# Patient Record
Sex: Male | Born: 1935 | Race: White | Hispanic: No | Marital: Married | Smoking: Former smoker
Health system: Southern US, Community
[De-identification: ages and names within clinical notes are randomized; demographics above are authoritative.]

## PROBLEM LIST (undated history)

## (undated) DIAGNOSIS — R0600 Dyspnea, unspecified: Secondary | ICD-10-CM

## (undated) DIAGNOSIS — M1990 Unspecified osteoarthritis, unspecified site: Secondary | ICD-10-CM

## (undated) DIAGNOSIS — M47812 Spondylosis without myelopathy or radiculopathy, cervical region: Secondary | ICD-10-CM

## (undated) DIAGNOSIS — J449 Chronic obstructive pulmonary disease, unspecified: Secondary | ICD-10-CM

## (undated) DIAGNOSIS — F419 Anxiety disorder, unspecified: Secondary | ICD-10-CM

## (undated) DIAGNOSIS — C801 Malignant (primary) neoplasm, unspecified: Secondary | ICD-10-CM

## (undated) DIAGNOSIS — Z87438 Personal history of other diseases of male genital organs: Secondary | ICD-10-CM

## (undated) DIAGNOSIS — T7840XA Allergy, unspecified, initial encounter: Secondary | ICD-10-CM

## (undated) DIAGNOSIS — I10 Essential (primary) hypertension: Secondary | ICD-10-CM

## (undated) DIAGNOSIS — R3911 Hesitancy of micturition: Secondary | ICD-10-CM

## (undated) DIAGNOSIS — T8859XA Other complications of anesthesia, initial encounter: Secondary | ICD-10-CM

## (undated) DIAGNOSIS — T4145XA Adverse effect of unspecified anesthetic, initial encounter: Secondary | ICD-10-CM

## (undated) DIAGNOSIS — Z9889 Other specified postprocedural states: Secondary | ICD-10-CM

## (undated) DIAGNOSIS — G8929 Other chronic pain: Secondary | ICD-10-CM

## (undated) DIAGNOSIS — R300 Dysuria: Secondary | ICD-10-CM

## (undated) DIAGNOSIS — K449 Diaphragmatic hernia without obstruction or gangrene: Secondary | ICD-10-CM

## (undated) DIAGNOSIS — K219 Gastro-esophageal reflux disease without esophagitis: Secondary | ICD-10-CM

## (undated) DIAGNOSIS — N320 Bladder-neck obstruction: Secondary | ICD-10-CM

## (undated) DIAGNOSIS — F40240 Claustrophobia: Secondary | ICD-10-CM

## (undated) DIAGNOSIS — R7301 Impaired fasting glucose: Secondary | ICD-10-CM

## (undated) DIAGNOSIS — Z8719 Personal history of other diseases of the digestive system: Secondary | ICD-10-CM

## (undated) DIAGNOSIS — Z8551 Personal history of malignant neoplasm of bladder: Secondary | ICD-10-CM

## (undated) HISTORY — PX: CARDIOVASCULAR STRESS TEST: SHX262

## (undated) HISTORY — DX: Allergy, unspecified, initial encounter: T78.40XA

## (undated) HISTORY — DX: Personal history of malignant neoplasm of bladder: Z85.51

## (undated) HISTORY — DX: Personal history of other diseases of the digestive system: Z87.19

## (undated) HISTORY — DX: Spondylosis without myelopathy or radiculopathy, cervical region: M47.812

## (undated) HISTORY — DX: Unspecified osteoarthritis, unspecified site: M19.90

## (undated) HISTORY — DX: Chronic obstructive pulmonary disease, unspecified: J44.9

## (undated) HISTORY — DX: Impaired fasting glucose: R73.01

## (undated) HISTORY — DX: Gastro-esophageal reflux disease without esophagitis: K21.9

## (undated) HISTORY — PX: TRANSURETHRAL RESECTION OF PROSTATE: SHX73

## (undated) HISTORY — DX: Malignant (primary) neoplasm, unspecified: C80.1

## (undated) HISTORY — DX: Other chronic pain: G89.29

## (undated) HISTORY — PX: CATARACT EXTRACTION W/ INTRAOCULAR LENS  IMPLANT, BILATERAL: SHX1307

## (undated) HISTORY — DX: Personal history of other diseases of male genital organs: Z87.438

## (undated) HISTORY — DX: Other specified postprocedural states: Z98.890

## (undated) HISTORY — DX: Diaphragmatic hernia without obstruction or gangrene: K44.9

---

## 1963-07-02 HISTORY — PX: OTHER SURGICAL HISTORY: SHX169

## 1982-07-01 HISTORY — PX: CHOLECYSTECTOMY: SHX55

## 1986-07-01 HISTORY — PX: RETINAL DETACHMENT SURGERY: SHX105

## 2005-03-21 ENCOUNTER — Encounter (INDEPENDENT_AMBULATORY_CARE_PROVIDER_SITE_OTHER): Payer: Self-pay | Admitting: Internal Medicine

## 2005-03-21 ENCOUNTER — Ambulatory Visit: Payer: Self-pay | Admitting: Internal Medicine

## 2005-03-21 ENCOUNTER — Ambulatory Visit (HOSPITAL_COMMUNITY): Admission: RE | Admit: 2005-03-21 | Discharge: 2005-03-21 | Payer: Self-pay | Admitting: Internal Medicine

## 2006-11-12 ENCOUNTER — Ambulatory Visit (HOSPITAL_COMMUNITY): Admission: RE | Admit: 2006-11-12 | Discharge: 2006-11-12 | Payer: Self-pay | Admitting: Family Medicine

## 2008-03-31 ENCOUNTER — Ambulatory Visit (HOSPITAL_COMMUNITY): Admission: RE | Admit: 2008-03-31 | Discharge: 2008-03-31 | Payer: Self-pay | Admitting: Family Medicine

## 2008-05-04 ENCOUNTER — Ambulatory Visit: Payer: Self-pay | Admitting: Cardiology

## 2008-05-04 ENCOUNTER — Ambulatory Visit (HOSPITAL_COMMUNITY): Admission: RE | Admit: 2008-05-04 | Discharge: 2008-05-04 | Payer: Self-pay | Admitting: Family Medicine

## 2008-05-09 ENCOUNTER — Encounter (HOSPITAL_COMMUNITY): Admission: RE | Admit: 2008-05-09 | Discharge: 2008-06-08 | Payer: Self-pay | Admitting: Cardiology

## 2008-05-09 ENCOUNTER — Ambulatory Visit: Payer: Self-pay | Admitting: Cardiology

## 2008-05-17 ENCOUNTER — Ambulatory Visit: Payer: Self-pay | Admitting: Cardiology

## 2008-09-06 ENCOUNTER — Ambulatory Visit: Payer: Self-pay | Admitting: Cardiology

## 2009-03-07 DIAGNOSIS — K219 Gastro-esophageal reflux disease without esophagitis: Secondary | ICD-10-CM

## 2009-03-07 DIAGNOSIS — R0789 Other chest pain: Secondary | ICD-10-CM

## 2009-03-07 DIAGNOSIS — N400 Enlarged prostate without lower urinary tract symptoms: Secondary | ICD-10-CM

## 2009-03-07 DIAGNOSIS — M1990 Unspecified osteoarthritis, unspecified site: Secondary | ICD-10-CM | POA: Insufficient documentation

## 2009-09-04 ENCOUNTER — Ambulatory Visit (HOSPITAL_COMMUNITY): Admission: RE | Admit: 2009-09-04 | Discharge: 2009-09-04 | Payer: Self-pay | Admitting: Family Medicine

## 2009-09-26 ENCOUNTER — Ambulatory Visit (HOSPITAL_COMMUNITY): Admission: RE | Admit: 2009-09-26 | Discharge: 2009-09-26 | Payer: Self-pay | Admitting: Family Medicine

## 2009-11-02 ENCOUNTER — Ambulatory Visit: Payer: Self-pay | Admitting: Cardiology

## 2009-11-02 ENCOUNTER — Encounter: Payer: Self-pay | Admitting: Adult Health

## 2009-11-02 DIAGNOSIS — R0609 Other forms of dyspnea: Secondary | ICD-10-CM | POA: Insufficient documentation

## 2009-11-02 DIAGNOSIS — R079 Chest pain, unspecified: Secondary | ICD-10-CM

## 2009-11-02 DIAGNOSIS — R030 Elevated blood-pressure reading, without diagnosis of hypertension: Secondary | ICD-10-CM

## 2009-11-02 DIAGNOSIS — R0989 Other specified symptoms and signs involving the circulatory and respiratory systems: Secondary | ICD-10-CM

## 2009-11-08 ENCOUNTER — Ambulatory Visit: Payer: Self-pay | Admitting: Cardiology

## 2009-11-08 ENCOUNTER — Encounter (HOSPITAL_COMMUNITY): Admission: RE | Admit: 2009-11-08 | Discharge: 2009-12-08 | Payer: Self-pay | Admitting: Cardiology

## 2009-11-14 ENCOUNTER — Encounter: Payer: Self-pay | Admitting: Cardiology

## 2009-11-17 ENCOUNTER — Ambulatory Visit: Payer: Self-pay | Admitting: Cardiology

## 2010-01-26 ENCOUNTER — Emergency Department (HOSPITAL_COMMUNITY): Admission: EM | Admit: 2010-01-26 | Discharge: 2010-01-26 | Payer: Self-pay | Admitting: Emergency Medicine

## 2010-03-26 ENCOUNTER — Inpatient Hospital Stay (HOSPITAL_COMMUNITY): Admission: RE | Admit: 2010-03-26 | Discharge: 2010-03-28 | Payer: Self-pay | Admitting: General Surgery

## 2010-03-27 HISTORY — PX: OTHER SURGICAL HISTORY: SHX169

## 2010-07-31 NOTE — Letter (Signed)
Summary: [place] [facility] (Nuc Med Stress)  [facility]  [address] [place] [address]   Phone: [phone]  Fax: [phone]    Nuclear Medicine 1-Day Stress Test Information Sheet  Re:     Randy [name]   DOB:     August 05, 1928 MRN:     [id number] Weight:  Appointment Date: Register at: Appointment Time: Referring MD:  ___Exercise Stress  __Adenosine   __Dobutamine  _X_Lexiscan  __Persantine   __Thallium  Urgency: ____1 (next day)   ____2 (one week)    ____3 (PRN)  Patient will receive Follow Up call with results: Patient needs follow-up appointment:  Instructions regarding medication:  How to prepare for your stress test: 1. DO NOT eat or dring 6 hours prior to your arrival time. This includes no caffeine (coffee, tea, sodas, chocolate) if you were instructed to take your medications, drink water with it. 2. DO NOT use any tobacco products for at leaset 8 hours prior to arrival. 3. DO NOT wear dresses or any clothing that may have metal clasps or buttons. 4. Wear short sleeve shirts, loose clothing, and comfortalbe walking shoes. 5. DO NOT use lotions, oils or powder on your chest before the test. 6. The test will take approximately 3-4 hours from the time you arrive until completion. 7. To register the day of the test, go to the Short Stay entrance at [facility]. 8. If you must cancel your test, call [phone] as soon as you are aware.  After you arrive for test:   When you arrive at [facility], you will go to Short Stay to be registered. They will then send you to Radiology to check in. The Nuclear Medicine Tech will get you and start an IV in your arm or hand. A small amount of a radioactive tracer will then be injected into your IV. This tracer will then have to circulate for 30-45 minutes. During this time you will wait in the waiting room and you will be able to drink something without caffeine. A series of pictures will be taken  of your heart follwoing this waiting period. After the 1st set of pictures you will go to the stress lab to get ready for your stress test. During the stress test, another small amount of a radioactive tracer will be injected through your IV. When the stress test is complete, there is a short rest period while your heart rate and blood pressure will be monitored. When this monitoring period is complete you will have another set of pictrues taken. (The same as the 1st set of pictures). These pictures are taken between 15 minutes and 1 hour after the stress test. The time depends on the type of stress test you had. Your doctor will inform you of your test results within 7 days after test.    The possibilities of certain changes are possible during the test. They include abnormal blood pressure and disorders of the heart. Side effects of persantine or adenosine can include flushing, chest pain, shortness of breath, stomach tightness, headache and light-headedness. These side effects usually do not last long and are self-resolving. Every effort will be made to keep you comfortable and to minimize complications by obtaining a medical history and by close observation during the test. Emergency equipment, medications, and trained personnel are available to deal with any unusual situation which may arise.  Please notify office at least 48 hours in advance if you are unable to keep  this appt.

## 2010-07-31 NOTE — Assessment & Plan Note (Signed)
Summary: 1 YR F/U PER CKOUT 09/06/08-DSF   Visit Type:  Follow-up Primary Provider:  luking,scott  CC:  shortness of breath.  History of Present Illness: [name] is a very pleasant 77 y/o CM who we are seeing on annual follow-up. He has a hx of GERD, DOE.  He was last seen in March of 2010 for evaluation of DOE.  He had a stress myoview in Nov of 2009, with overall risk low, no clearly diagnostic ST-segment changes noted during exercise.  Perfusion data demonstrated a mild to moderate inferior wall persistant defect.  No clear evidence of ischemia.  His EF at the time showed 69% with possible mild inferior hypokinesis.  Since being seen last, Dr. Gerda Diss had him seen by Dr. Juanetta Gosling for pulmonary evaluation.  He was placed on Spiriva.  He began to have itching of the face, areas above the eyes and across the chest. He stopped this medication this week and has appt with Dr. Juanetta Gosling in 7 days.  He states that the Spiriva did not change his symptoms.  He states that he has dyspnea walking 21ft or more.  However, he can mow his lawn with a self-propelled mower and has no difficulties. Resting helps.  He denies chest pain, but continues to have some hiatal hernia discomfort that is not new for him.  He remains on Priolosec and states this has helped.  Preventive Screening-Counseling & Management  Alcohol-Tobacco     Alcohol drinks/day: 0     Smoking Status: quit  Problems Prior to Update: 1)  Benign Prostatic Hypertrophy, Hx of  (ICD-V13.8) 2)  Degenerative Joint Disease  (ICD-715.90) 3)  Gerd  (ICD-530.81) 4)  Chest Discomfort  (ICD-786.59)  Current Medications (verified): 1)  Tamsulosin Hcl 0.4 Mg Caps (Tamsulosin Hcl) .... 2 Caps Every Morning 2)  Finasteride 5 Mg Tabs (Finasteride) .... Take 1 Tab Daily 3)  Prilosec 20 Mg Cpdr (Omeprazole) .... Take 1 Tab Daily 4)  Ibuprofen 200 Mg Caps (Ibuprofen) .... Take Prn 5)  Vitamin B-12 1000 Mcg Tabs (Cyanocobalamin) .... Take 1 Tab  Daily  Allergies (verified): No Known Drug Allergies  Past History:  Past medical, surgical, family and social histories (including risk factors) reviewed, and no changes noted (except as noted below).  Past Medical History: Reviewed history from 03/07/2009 and no changes required. Current Problems:  BENIGN PROSTATIC HYPERTROPHY, HX OF (ICD-V13.8) DEGENERATIVE JOINT DISEASE (ICD-715.90) GERD (ICD-530.81) CHEST DISCOMFORT (ZOX-096.04)  Past Surgical History: Reviewed history from 03/07/2009 and no changes required. gallbladder bilaterial cataracts removed colonoscopy  Family History: Reviewed history from 03/07/2009 and no changes required. Father:deceased age 63 renal failure Mother:deceased age 60 stroke Siblings:4 brothers 3 of whom are deceased cause unknown 4 sisters alive and well  Social History: Reviewed history from 03/07/2009 and no changes required. Retired  Married  Tobacco Use - Former.  Alcohol Use - no Regular Exercise - yes Drug Use - no Alcohol drinks/day:  0  Review of Systems       Puritis of the face, eyes, and chest wall.  Vital Signs:  Patient profile:   77 year old male Height:      70 inches Weight:      167 pounds BMI:     24.05 Pulse rate:   80 / minute BP sitting:   164 / 92  (right arm)  Vitals Entered By: Dreama Saa, CNA (Nov 02, 2009 10:55 AM)  Physical Exam  General:  Well developed, well nourished, in no acute  distress. Head:  normocephalic and atraumatic Eyes:  PERRLA/EOM intact; conjunctiva and lids normal. Ears:  TM's intact and clear with normal canals and hearing Nose:  no deformity, discharge, inflammation, or lesions Mouth:  Teeth, gums and palate normal. Oral mucosa normal. Neck:  Neck supple, no JVD. No masses, thyromegaly or abnormal cervical nodes. Lungs:  Crackles in the RLL no wheezes noted. Heart:  RRR with 1/6 Systolic murmur RSB.   Abdomen:  Bowel sounds positive; abdomen soft and non-tender without  masses, organomegaly, or hernias noted. No hepatosplenomegaly. Msk:  Back normal, normal gait. Muscle strength and tone normal. Pulses:  pulses normal in all 4 extremities Extremities:  No clubbing or cyanosis. Neurologic:  Alert and oriented x 3. Psych:  Normal affect.   Impression & Recommendations:  Problem # 1:  CHEST DISCOMFORT (ICD-786.59) The patients symptoms are persistant, and DOE seems to have worsened per pt, despite pulmonary evlauation and tx.  EKG shows some anterior nonspecific T-wave abnormalities.  Will plan to have lexascan myoview to ascertain for ischemia.  He is a little clostrophobic, so I have prescribed valium 5 mg prior to the test to aid in decreasing his anxiety.    Problem # 2:  ELEVATED BLOOD PRESSURE WITHOUT DIAGNOSIS OF HYPERTENSION (ICD-796.2) I believe this is related to white coat and possibly wait time to be seen.  We check again on stress test.  If remains elevated, will start antihypertensive.  Problem # 3:  DYSPNEA ON EXERTION (ICD-786.09) Will assess cardiac etiology and have recommended he follow-up with Dr. Juanetta Gosling for more tx.  I have advised him to take OTC benedryl for itching and report these symptoms to Dr. Juanetta Gosling.  Other Orders: Nuclear Stress Test (Nuc Stress Test)  Patient Instructions: 1)  Your physician recommends that you schedule a follow-up appointment in: after Stress test 2)  Your physician has requested that you have an Lexiscan myoview.  For further information please visit [email or web address]  Please follow instruction sheet, as given. 3)  ***Prescription for 1 tablet of Valium 5mg  has been called into [facility]. Please take Valium tablet 1/2 hour to 1 hour before Stress test.***

## 2010-07-31 NOTE — Assessment & Plan Note (Signed)
Summary: ROV POST STRESS TEST   Visit Type:  Follow-up Primary Provider:  luking,scott  CC:  NO CARDIOLOGY COMPLAINTS.  History of Present Illness: Randy [name] is a very friendly CM with history of GERD and DOE.  He is here for follow-up on test results from exercise myoview to evaluate for ischemia causing symptoms.  He is without complaint other than mild dyspnea, mild GERD symptoms at times.  Current Medications (verified): 1)  Tamsulosin Hcl 0.4 Mg Caps (Tamsulosin Hcl) .... 2 Caps Every Morning 2)  Finasteride 5 Mg Tabs (Finasteride) .... Take 1 Tab Daily 3)  Prilosec 20 Mg Cpdr (Omeprazole) .... Take 1 Tab Daily 4)  Ibuprofen 200 Mg Caps (Ibuprofen) .... Take Prn 5)  Vitamin B-12 1000 Mcg Tabs (Cyanocobalamin) .... Take 1 Tab Daily  Allergies (verified): No Known Drug Allergies  Review of Systems       All other systems have been reviewed and are negative unless stated above.   Vital Signs:  Patient profile:   77 year old male Weight:      165 pounds Pulse rate:   80 / minute BP sitting:   135 / 73  (right arm)  Vitals Entered By: Dreama Saa, CNA (Nov 17, 2009 2:10 PM)  Physical Exam  General:  Well developed, well nourished, in no acute distress. Lungs:  Clear bilaterally to auscultation and percussion. Heart:  Non-displaced PMI, chest non-tender; regular rate and rhythm, S1, S2 without murmurs, rubs or gallops. Carotid upstroke normal, no bruit. Normal abdominal aortic size, no bruits. Femorals normal pulses, no bruits. Pedals normal pulses. No edema, no varicosities.   Impression & Recommendations:  Problem # 1:  DYSPNEA ON EXERTION (ICD-786.09) Review of nuclear study reveals no large reversiible defects to indicate ischemia,  LVEF 64%.  Hypertensive responsie to excervise of 172/82.  We will make no changes to his medication regiimine as he is not doing a lot of exertional activity.  Should his BP become an issue when he is not exertional will reconsider  adding ACE.  He will see [place] in 1 yr unlless he needs to see [place] sooner.  Patient Instructions: 1)  Your physician recommends that you schedule a follow-up appointment in: 1 year 2)  Your physician recommends that you continue on your current medications as directed. Please refer to the Current Medication list given to you today.

## 2010-07-31 NOTE — Letter (Signed)
Summary: [place] [facility] at [facility]  [address] [place] [address]   Phone: [phone]  Fax: [phone]      Nov 14, 2009 MRN: [id number]   Randy [name] [address] [place], [place]  [address]   Dear Mr. LEONHARD,  Your test ordered by Selena Batten has been reviewed by your physician (or physician assistant) and was found to be normal or stable. Your physician (or physician assistant) felt no changes were needed at this time.  ____ Echocardiogram  __X__ Cardiac Stress Test  ____ Lab Work  ____ Peripheral vascular study of arms, legs or neck  ____ CT scan or X-ray  ____ Lung or Breathing test  ____ Other: Please continue current medical treatment.  Thank you.    Bing, MD, F.A.C.C

## 2010-09-13 LAB — COMPREHENSIVE METABOLIC PANEL
ALT: 16 U/L (ref 0–53)
AST: 16 U/L (ref 0–37)
Alkaline Phosphatase: 50 U/L (ref 39–117)
BUN: 16 mg/dL (ref 6–23)
Chloride: 104 mEq/L (ref 96–112)
Creatinine, Ser: 1.36 mg/dL (ref 0.4–1.5)
GFR calc non Af Amer: 51 mL/min — ABNORMAL LOW (ref >60)
Potassium: 4.9 mEq/L (ref 3.5–5.1)

## 2010-09-13 LAB — URINALYSIS, ROUTINE W REFLEX MICROSCOPIC
Glucose, UA: NEGATIVE mg/dL
Ketones, ur: NEGATIVE mg/dL
Leukocytes, UA: NEGATIVE
Protein, ur: NEGATIVE mg/dL
Specific Gravity, Urine: 1.009 (ref 1.005–1.030)
pH: 6 (ref 5.0–8.0)

## 2010-09-13 LAB — DIFFERENTIAL
Basophils Relative: 0 % (ref 0–1)
Eosinophils Absolute: 0.2 10*3/uL (ref 0.0–0.7)
Eosinophils Relative: 3 % (ref 0–5)
Monocytes Absolute: 0.6 10*3/uL (ref 0.1–1.0)
Monocytes Relative: 11 % (ref 3–12)
Neutro Abs: 3.3 10*3/uL (ref 1.7–7.7)
Neutrophils Relative %: 58 % (ref 43–77)

## 2010-09-13 LAB — CBC
HCT: 44.7 % (ref 39.0–52.0)
Hemoglobin: 13.4 g/dL (ref 13.0–17.0)
MCHC: 33.9 g/dL (ref 30.0–36.0)
MCHC: 34 g/dL (ref 30.0–36.0)
MCV: 83.4 fL (ref 78.0–100.0)
MCV: 83.4 fL (ref 78.0–100.0)
Platelets: 167 10*3/uL (ref 150–400)
RBC: 4.74 MIL/uL (ref 4.22–5.81)
RBC: 5.36 MIL/uL (ref 4.22–5.81)
WBC: 5.8 10*3/uL (ref 4.0–10.5)
WBC: 8.2 10*3/uL (ref 4.0–10.5)

## 2010-09-13 LAB — SURGICAL PCR SCREEN
MRSA, PCR: NEGATIVE
Staphylococcus aureus: NEGATIVE

## 2010-09-13 LAB — URINE MICROSCOPIC-ADD ON

## 2010-09-15 LAB — URINE CULTURE

## 2010-09-15 LAB — COMPREHENSIVE METABOLIC PANEL
AST: 13 U/L (ref 0–37)
Albumin: 3.9 g/dL (ref 3.5–5.2)
Alkaline Phosphatase: 50 U/L (ref 39–117)
Calcium: 8.8 mg/dL (ref 8.4–10.5)
GFR calc non Af Amer: 48 mL/min — ABNORMAL LOW (ref >60)
Glucose, Bld: 121 mg/dL — ABNORMAL HIGH (ref 70–99)
Potassium: 4.3 mEq/L (ref 3.5–5.1)
Sodium: 134 mEq/L — ABNORMAL LOW (ref 135–145)
Total Protein: 7 g/dL (ref 6.0–8.3)

## 2010-09-15 LAB — CBC
HCT: 40.8 % (ref 39.0–52.0)
Hemoglobin: 13.9 g/dL (ref 13.0–17.0)
MCHC: 34 g/dL (ref 30.0–36.0)
MCV: 81.4 fL (ref 78.0–100.0)
RDW: 15 % (ref 11.5–15.5)

## 2010-09-15 LAB — POCT CARDIAC MARKERS

## 2010-09-15 LAB — URINALYSIS, ROUTINE W REFLEX MICROSCOPIC
Bilirubin Urine: NEGATIVE
Glucose, UA: NEGATIVE mg/dL
Ketones, ur: NEGATIVE mg/dL
Nitrite: NEGATIVE
Protein, ur: NEGATIVE mg/dL
Specific Gravity, Urine: 1.02 (ref 1.005–1.030)
Urobilinogen, UA: 0.2 mg/dL (ref 0.0–1.0)
pH: 5.5 (ref 5.0–8.0)

## 2010-09-15 LAB — DIFFERENTIAL
Basophils Absolute: 0 10*3/uL (ref 0.0–0.1)
Basophils Relative: 0 % (ref 0–1)
Eosinophils Absolute: 0.1 10*3/uL (ref 0.0–0.7)
Eosinophils Relative: 1 % (ref 0–5)
Lymphs Abs: 0.8 10*3/uL (ref 0.7–4.0)
Monocytes Relative: 8 % (ref 3–12)

## 2010-11-13 NOTE — Letter (Signed)
May 04, 2008    Scott A. Gerda Diss, MD  [address], [address]  [place], [place] [address]   RE:  ICKER, SWIGERT  MRN:  [id number]  /  DOB:  September 24, 1928   Dear Lorin Picket:   It is my pleasure evaluating [name] in consultation in the office  today at your request for chest discomfort.  As you know, this nice  gentleman has enjoyed generally excellent health.  He has no history of  hypertension, diabetes or hyperlipidemia.  He does have a 50-pack-year  history of cigarette smoking, but this was discontinued approximately 10  years ago.  He has noted over the past few weeks exertional chest  pressure.  This is described as fairly mild.  There is minimal  associated dyspnea and no nausea nor diaphoresis, although he has noted  increased diaphoresis at other times.  Symptoms generally resolve  spontaneously over a variable period of time.  He has had prolonged  symptoms on occasion lasting a day or two.  He describes separate  symptoms, that have been present for a longer period of time.  This is a  vague discomfort along both posterior axillary lines associated with  increased use of the upper extremities.  He is active and generally can  exercise without difficulty.   He has no history of cardiac disease.  He has never previously seen a  cardiologist.  He has not undergone any significant cardiac testing.   PAST MEDICAL HISTORY:  Notable for GERD, DJD and BPH.   ALLERGIES:  He has no known drug allergies.   CURRENT MEDICATIONS:  1. Prilosec 20 mg daily.  2. Finasteride 5 mg daily.  3. Flomax 0.8 mg daily.   SOCIAL HISTORY:  Retired from work with the post office; married with 4  adult children.   FAMILY HISTORY:  Positive for fatal renal diseases in his father at  advanced age and a CVA in his mother at age 75.  He has had 8 siblings;  3 brothers are deceased.  There is no prominent coronary artery disease.   REVIEW OF SYSTEMS:  Notable for occasional dizziness, bilateral  cataract  surgery, some hearing impairment, upper and lower dentures, urinary  frequency and diffuse arthritic discomfort.  All other systems reviewed  and are negative.   PHYSICAL EXAMINATION:  GENERAL:  On exam, pleasant, well-appearing trim  gentleman in no acute distress.  VITAL SIGNS:  The weight is 166.  Blood pressure 135/75, heart rate 95  and regular, respirations 14.  NECK:  No jugular venous distention; normal carotid upstrokes without  bruits.  HEENT:  Anicteric sclerae; normal lids and conjunctivae; normal oral  mucosa.  ENDOCRINE:  No thyromegaly.  HEMATOPOIETIC:  No adenopathy.  SKIN:  No significant lesions.  LUNGS:  Clear.  CARDIAC:  Normal first and second heart sounds; normal PMI; minimal  systolic ejection murmur.  ABDOMEN:  Soft and nontender; no organomegaly; no masses; normal bowel  sounds without bruits.  EXTREMITIES:  Normal distal pulses; no edema.  NEUROLOGIC:  Symmetric strength and tone; normal cranial nerves.   Recent laboratory shows a normal CBC and a good lipid profile.  Creatinine values have of been borderline, most recently 1.42.  LFTs  were normal.   EKG from your office shows sinus rhythm with PACs.  There is slightly  delayed R-wave progression with nonspecific ST-T wave abnormality.   IMPRESSION:  Mr. Colver has somewhat worrisome, but not classic symptoms  for myocardial ischemia with fairly modest  risk factors.  He certainly  requires additional testing.  We will proceed with a stress nuclear  study.  A chest x-ray will also be obtained.  I will reassess this nice  gentleman after those studies have been completed.  Thank you so much  for sending him to me for evaluation.    Sincerely,      Gerrit Friends. Dietrich Pates, MD, [facility]  Electronically Signed    RMR/MedQ  DD: 05/04/2008  DT: 05/05/2008  Job #: [id number]

## 2010-11-13 NOTE — Letter (Signed)
May 17, 2008    Randy A. Gerda Diss, MD  [address], [address]  [place], [place] [address]   RE:  Randy, [name]  MRN:  [name]  /  DOB:  [name]   Dear Randy [name],   Randy [name] returns to the office for continued assessment and treatment  of chest discomfort.  Over the past few days, he has been free of  symptoms, but prior to that, he continued to experience some vague  exertional and nonexertional chest pressure that resolved spontaneously.  Medications are unchanged from his last visit.   PHYSICAL EXAMINATION:  GENERAL:  A pleasant gentleman in no acute  distress.  VITAL SIGNS:  The weight is 162, 4 pounds less than at his last visit.  Blood pressure 120/80, heart rate 65 and regular, and respirations 14.  NECK:  No jugular vein distention.  LUNGS:  Clear.  CARDIAC:  Normal first and second heart sounds; modest systolic ejection  murmur.  ABDOMEN:  Soft and nontender; no masses.  No organomegaly.  EXTREMITIES:  No edema.   A stress nuclear study revealed impaired exercise capacity with the  patient achieving a workload of only 5 METS.  The patient claims that he  could have achieved a higher level of exertion if permitted to continue.  There was no electrocardiographic evidence for ischemia.  His  scintigraphic images were read as showing a basilar inferior defect  compatible with scarring or soft tissue attenuation.  I reviewed the  images and find them to be normal.   IMPRESSION:  Randy [name] has had resolution of his symptoms over the past  few days, but I suspect they will recur.  He has a low risk stress  nuclear study.  For now, I think it is safe to continue to observe him.  I suspect that he will ultimately require coronary angiography for  definitive diagnosis.  I will plan to reassess his symptoms in 3 months  or sooner at your discretion.   Thank you so much for sending this nice gentleman to see me.    Sincerely,      Gerrit Friends. Dietrich Pates, MD, [facility]  Electronically Signed    RMR/MedQ  DD: 05/17/2008  DT: 05/18/2008  Job #: [id number]

## 2010-11-13 NOTE — Letter (Signed)
September 06, 2008    Randy A. Gerda Diss, MD  [address], [address]  [place], [place] [address]   RE:  Randy, Randy [name]  MRN:  [id number]  /  DOB:  1928-08-08   Dear Randy Randy [name],   Randy Randy [name] returns to the office for continued assessment and treatment  of chest discomfort.  He has had minimal symptoms, which she attributes  to his GERD.  He has had no major reflux nor water brash.  He has rare  and intermittent hoarseness that he also attributes to GERD.  BPH is  under good control.  He has fairly good exercise tolerance, having  recently rototilled his garden.  He gets some fatigue, but no real  dyspnea and no exercise related chest discomfort.   CURRENT MEDICATIONS:  Unchanged and include Prilosec 20 mg daily,  finasteride 5 mg daily, Flomax 0.8 mg daily.   PHYSICAL EXAMINATION:  GENERAL:  Pleasant proportionate gentleman in no  acute distress.  VITAL SIGNS:  The weight is 164, two pounds more than in November.  Blood pressure 120/80, heart rate 70 and regular, respirations 12 and  unlabored.  NECK:  No jugular venous distention; no carotid bruits.  LUNGS:  Clear.  CARDIAC:  Normal first and second heart sounds.  ABDOMEN:  Soft and nontender; no organomegaly.  EXTREMITIES:  No edema.   Recent lab includes a normal chemistry profile except for glucose of  129.  Creatinine is borderline at 1.4 as his potassium at 5.3.  His  lipid profile was good with a total cholesterol of 180, HDL of 48, and  LDL of 107.   IMPRESSION:  Randy Randy [name] is doing well overall.  I suggested that he  could increase his dose of Prilosec 220 mg b.i.d. if he has more  symptoms of hoarseness or chest discomfort.  I will plan to reassess  this nice gentleman in 1 year.    Sincerely,      Gerrit Friends. Dietrich Pates, MD, [facility]  Electronically Signed    RMR/MedQ  DD: 09/06/2008  DT: 09/07/2008  Job #: [id number]

## 2010-11-16 NOTE — Op Note (Signed)
Randy [name], Randy [name]                ACCOUNT NO.:  [id number]   MEDICAL RECORD NO.:  [id number]          PATIENT TYPE:  AMB   LOCATION:  DAY                           FACILITY:  [facility]   PHYSICIAN:  Lionel December, M.D.    DATE OF BIRTH:  06/21/1929   DATE OF PROCEDURE:  03/21/2005  DATE OF DISCHARGE:                                 OPERATIVE REPORT   PROCEDURE:  Colonoscopy with polypectomy.   INDICATIONS:  Keeghan is 77 year old Caucasian male with two polyps removed in  May2000, one of which was tubular adenoma. Family history is positive for  colon carcinoma in one of his older brothers who had the disease in his 65s  and died of unrelated causes. He presently is free of any GI symptoms.  Procedure risks were reviewed with the patient, and informed consent was  obtained.   PREMEDICATION:  Demerol 50 mg IV, Versed 3 mg IV in divided dose.   FINDINGS:  Procedure performed in endoscopy suite. The patient's vital signs  and O2 saturation were monitored during the procedure and remained stable.  The patient was placed in left lateral position and rectal examination  performed. No abnormality noted on external or digital exam. Olympus  videoscope was placed in the rectum and advanced under vision into sigmoid  colon. He still has some thick liquid stool in sigmoid colon which had to be  washed and eventually suctioned out. He had a few scattered diverticula at  sigmoid colon and few more at the ascending colon. Scope was passed to cecum  which was identified by ileocecal valve and appendiceal orifice. Pictures  taken for the record. There was a 6 to 7 mm sessile polyp across from the  ileocecal valve which was snared and retrieved histologic examination. There  was a diverticulum to the right but away from this polyp. Polypectomy site  was clean. As the scope was withdrawn, the rest of the colonic mucosa was  carefully examined, and no other abnormalities were noted. Rectal mucosa was  normal. Scope was retroflexed to examine anorectal junction, and small  hemorrhoids were noted below the dentate line. Endoscope was straightened  and withdrawn. The patient tolerated the procedure well.   FINAL DIAGNOSIS:  1.  Pan colonic diverticulosis. Most of the diverticula are at sigmoid colon      and ascending colon but this are only few in number.  2.  A 6 to 7 mm polyp snared from cecum.  3.  Small external hemorrhoids.   RECOMMENDATIONS:  1.  Standard instructions given.  2.  High-fiber diet.  3.  I will be contacting the patient with biopsy results.  4.  Given his personal and family history, he may consider next exam in five      years as long as he is in good health.      Lionel December, M.D.  Electronically Signed     NR/MEDQ  D:  03/21/2005  T:  03/21/2005  Job:  [id number]

## 2011-03-20 ENCOUNTER — Other Ambulatory Visit: Payer: Self-pay | Admitting: Family Medicine

## 2011-03-20 DIAGNOSIS — R270 Ataxia, unspecified: Secondary | ICD-10-CM

## 2011-03-22 ENCOUNTER — Ambulatory Visit (HOSPITAL_COMMUNITY)
Admission: RE | Admit: 2011-03-22 | Discharge: 2011-03-22 | Disposition: A | Discharge status: Home / Self Care | Payer: Medicare Other | Source: Ambulatory Visit | Attending: Family Medicine | Admitting: Family Medicine

## 2011-03-22 DIAGNOSIS — R209 Unspecified disturbances of skin sensation: Secondary | ICD-10-CM | POA: Insufficient documentation

## 2011-03-22 DIAGNOSIS — R55 Syncope and collapse: Secondary | ICD-10-CM | POA: Insufficient documentation

## 2011-03-22 DIAGNOSIS — R270 Ataxia, unspecified: Secondary | ICD-10-CM

## 2011-03-22 DIAGNOSIS — R279 Unspecified lack of coordination: Secondary | ICD-10-CM | POA: Insufficient documentation

## 2011-03-22 DIAGNOSIS — I6529 Occlusion and stenosis of unspecified carotid artery: Secondary | ICD-10-CM | POA: Insufficient documentation

## 2011-06-27 ENCOUNTER — Encounter: Payer: Self-pay | Admitting: Cardiology

## 2011-08-14 DIAGNOSIS — Z79899 Other long term (current) drug therapy: Secondary | ICD-10-CM | POA: Diagnosis not present

## 2011-08-14 DIAGNOSIS — Z0000 Encounter for general adult medical examination without abnormal findings: Secondary | ICD-10-CM | POA: Diagnosis not present

## 2011-08-26 DIAGNOSIS — J40 Bronchitis, not specified as acute or chronic: Secondary | ICD-10-CM | POA: Diagnosis not present

## 2011-08-26 DIAGNOSIS — J988 Other specified respiratory disorders: Secondary | ICD-10-CM | POA: Diagnosis not present

## 2011-10-14 DIAGNOSIS — N400 Enlarged prostate without lower urinary tract symptoms: Secondary | ICD-10-CM | POA: Diagnosis not present

## 2011-10-14 DIAGNOSIS — I951 Orthostatic hypotension: Secondary | ICD-10-CM | POA: Diagnosis not present

## 2011-12-12 DIAGNOSIS — Z961 Presence of intraocular lens: Secondary | ICD-10-CM | POA: Diagnosis not present

## 2011-12-17 ENCOUNTER — Other Ambulatory Visit: Payer: Self-pay | Admitting: Urology

## 2011-12-17 ENCOUNTER — Ambulatory Visit (INDEPENDENT_AMBULATORY_CARE_PROVIDER_SITE_OTHER): Payer: Medicare Other | Admitting: Urology

## 2011-12-17 DIAGNOSIS — N400 Enlarged prostate without lower urinary tract symptoms: Secondary | ICD-10-CM | POA: Diagnosis not present

## 2011-12-17 DIAGNOSIS — C679 Malignant neoplasm of bladder, unspecified: Secondary | ICD-10-CM

## 2011-12-17 DIAGNOSIS — N401 Enlarged prostate with lower urinary tract symptoms: Secondary | ICD-10-CM

## 2011-12-18 ENCOUNTER — Other Ambulatory Visit: Payer: Self-pay | Admitting: Urology

## 2011-12-19 ENCOUNTER — Ambulatory Visit (HOSPITAL_COMMUNITY)
Admission: RE | Admit: 2011-12-19 | Discharge: 2011-12-19 | Disposition: A | Discharge status: Home / Self Care | Payer: Medicare Other | Source: Ambulatory Visit | Attending: Urology | Admitting: Urology

## 2011-12-19 ENCOUNTER — Other Ambulatory Visit: Payer: Self-pay | Admitting: Urology

## 2011-12-19 DIAGNOSIS — K5730 Diverticulosis of large intestine without perforation or abscess without bleeding: Secondary | ICD-10-CM | POA: Insufficient documentation

## 2011-12-19 DIAGNOSIS — C679 Malignant neoplasm of bladder, unspecified: Secondary | ICD-10-CM | POA: Insufficient documentation

## 2011-12-19 LAB — POCT I-STAT, CHEM 8
BUN: 26 mg/dL — ABNORMAL HIGH (ref 6–23)
Creatinine, Ser: 1.2 mg/dL (ref 0.50–1.35)
Glucose, Bld: 115 mg/dL — ABNORMAL HIGH (ref 70–99)
Hemoglobin: 16.7 g/dL (ref 13.0–17.0)
Sodium: 141 mEq/L (ref 135–145)
TCO2: 27 mmol/L (ref 0–100)

## 2011-12-19 MED ORDER — IOHEXOL 300 MG/ML  SOLN
125.0000 mL | Freq: Once | INTRAMUSCULAR | Status: AC | PRN
  Administered 2011-12-19: 125 mL via INTRAVENOUS

## 2011-12-19 NOTE — OR Nursing (Signed)
Istat obtained from venipunture to left [facility]. (EC 8) Patient tolerated well. One stick. Pressure applied after removing needle. Patient stable.

## 2011-12-23 ENCOUNTER — Ambulatory Visit (HOSPITAL_COMMUNITY)
Admission: RE | Admit: 2011-12-23 | Discharge: 2011-12-23 | Disposition: A | Discharge status: Home / Self Care | Payer: Medicare Other | Source: Ambulatory Visit | Attending: Urology | Admitting: Urology

## 2011-12-23 DIAGNOSIS — C679 Malignant neoplasm of bladder, unspecified: Secondary | ICD-10-CM | POA: Insufficient documentation

## 2011-12-23 LAB — POCT I-STAT, CHEM 8
Calcium, Ion: 1.2 mmol/L (ref 1.12–1.32)
Creatinine, Ser: 1.2 mg/dL (ref 0.50–1.35)
Glucose, Bld: 117 mg/dL — ABNORMAL HIGH (ref 70–99)
Hemoglobin: 17.7 g/dL — ABNORMAL HIGH (ref 13.0–17.0)
Potassium: 5 mEq/L (ref 3.5–5.1)

## 2011-12-23 MED ORDER — IOHEXOL 300 MG/ML  SOLN
125.0000 mL | Freq: Once | INTRAMUSCULAR | Status: AC | PRN
  Administered 2011-12-23: 125 mL via INTRAVENOUS

## 2011-12-23 NOTE — OR Nursing (Signed)
12/23/2011  ISTAT completed for CT. (ES-8) Drawn in left [facility]. Patient tolerated well.

## 2011-12-26 ENCOUNTER — Encounter (HOSPITAL_BASED_OUTPATIENT_CLINIC_OR_DEPARTMENT_OTHER): Payer: Self-pay | Admitting: *Deleted

## 2011-12-26 NOTE — Progress Notes (Signed)
To [facility] @ 0745-Istat,Cxr,Ekg on arrival-Npo after Mn-will take prilosec,xanax with sip water that am-guidelines for RCC reviewed.

## 2012-01-05 NOTE — Anesthesia Preprocedure Evaluation (Addendum)
Anesthesia Evaluation  Patient identified by MRN, date of birth, ID band Patient awake    Reviewed: Allergy & Precautions, H&P , NPO status , Patient's Chart, lab work & pertinent test results  Airway Mallampati: II TM Distance: >3 FB Neck ROM: full    Dental  (+) Edentulous Upper and Edentulous Lower   Pulmonary neg pulmonary ROS, shortness of breath and with exertion,  breath sounds clear to auscultation  Pulmonary exam normal       Cardiovascular Exercise Tolerance: Good negative cardio ROS  Rhythm:regular Rate:Normal     Neuro/Psych negative neurological ROS  negative psych ROS   GI/Hepatic negative GI ROS, Neg liver ROS, GERD-  Medicated and Controlled,  Endo/Other  negative endocrine ROS  Renal/GU negative Renal ROS  negative genitourinary   Musculoskeletal   Abdominal   Peds  Hematology negative hematology ROS (+)   Anesthesia Other Findings   Reproductive/Obstetrics negative OB ROS                          Anesthesia Physical Anesthesia Plan  ASA: II  Anesthesia Plan: General   Post-op Pain Management:    Induction: Intravenous  Airway Management Planned: LMA  Additional Equipment:   Intra-op Plan:   Post-operative Plan:   Informed Consent: I have reviewed the patients History and Physical, chart, labs and discussed the procedure including the risks, benefits and alternatives for the proposed anesthesia with the patient or authorized representative who has indicated his/her understanding and acceptance.   Dental Advisory Given  Plan Discussed with: CRNA and Surgeon  Anesthesia Plan Comments:         Anesthesia Quick Evaluation

## 2012-01-06 ENCOUNTER — Encounter (HOSPITAL_BASED_OUTPATIENT_CLINIC_OR_DEPARTMENT_OTHER): Payer: Self-pay | Admitting: Anesthesiology

## 2012-01-06 ENCOUNTER — Encounter (HOSPITAL_BASED_OUTPATIENT_CLINIC_OR_DEPARTMENT_OTHER)
Admission: RE | Disposition: A | Discharge status: Home / Self Care | Payer: Self-pay | Source: Ambulatory Visit | Attending: Urology

## 2012-01-06 ENCOUNTER — Ambulatory Visit (HOSPITAL_BASED_OUTPATIENT_CLINIC_OR_DEPARTMENT_OTHER)
Admission: RE | Admit: 2012-01-06 | Discharge: 2012-01-07 | Disposition: A | Discharge status: Home / Self Care | Payer: Medicare Other | Source: Ambulatory Visit | Attending: Urology | Admitting: Urology

## 2012-01-06 ENCOUNTER — Encounter (HOSPITAL_BASED_OUTPATIENT_CLINIC_OR_DEPARTMENT_OTHER): Payer: Self-pay | Admitting: *Deleted

## 2012-01-06 ENCOUNTER — Ambulatory Visit (HOSPITAL_COMMUNITY): Payer: Medicare Other

## 2012-01-06 ENCOUNTER — Ambulatory Visit (HOSPITAL_BASED_OUTPATIENT_CLINIC_OR_DEPARTMENT_OTHER): Payer: Medicare Other | Admitting: Anesthesiology

## 2012-01-06 DIAGNOSIS — C675 Malignant neoplasm of bladder neck: Secondary | ICD-10-CM | POA: Diagnosis not present

## 2012-01-06 DIAGNOSIS — N400 Enlarged prostate without lower urinary tract symptoms: Secondary | ICD-10-CM | POA: Diagnosis not present

## 2012-01-06 DIAGNOSIS — R05 Cough: Secondary | ICD-10-CM | POA: Diagnosis not present

## 2012-01-06 DIAGNOSIS — C679 Malignant neoplasm of bladder, unspecified: Secondary | ICD-10-CM | POA: Diagnosis not present

## 2012-01-06 DIAGNOSIS — D494 Neoplasm of unspecified behavior of bladder: Secondary | ICD-10-CM | POA: Diagnosis not present

## 2012-01-06 DIAGNOSIS — K219 Gastro-esophageal reflux disease without esophagitis: Secondary | ICD-10-CM | POA: Diagnosis not present

## 2012-01-06 DIAGNOSIS — N401 Enlarged prostate with lower urinary tract symptoms: Secondary | ICD-10-CM | POA: Insufficient documentation

## 2012-01-06 DIAGNOSIS — Z87891 Personal history of nicotine dependence: Secondary | ICD-10-CM | POA: Diagnosis not present

## 2012-01-06 DIAGNOSIS — Z01811 Encounter for preprocedural respiratory examination: Secondary | ICD-10-CM | POA: Diagnosis not present

## 2012-01-06 DIAGNOSIS — N3289 Other specified disorders of bladder: Secondary | ICD-10-CM | POA: Insufficient documentation

## 2012-01-06 DIAGNOSIS — C670 Malignant neoplasm of trigone of bladder: Secondary | ICD-10-CM | POA: Diagnosis not present

## 2012-01-06 DIAGNOSIS — N138 Other obstructive and reflux uropathy: Secondary | ICD-10-CM | POA: Insufficient documentation

## 2012-01-06 DIAGNOSIS — C61 Malignant neoplasm of prostate: Secondary | ICD-10-CM | POA: Diagnosis not present

## 2012-01-06 HISTORY — DX: Adverse effect of unspecified anesthetic, initial encounter: T41.45XA

## 2012-01-06 HISTORY — PX: TRANSURETHRAL RESECTION OF BLADDER TUMOR: SHX2575

## 2012-01-06 HISTORY — DX: Anxiety disorder, unspecified: F41.9

## 2012-01-06 HISTORY — DX: Other complications of anesthesia, initial encounter: T88.59XA

## 2012-01-06 LAB — POCT I-STAT 4, (NA,K, GLUC, HGB,HCT)
Glucose, Bld: 103 mg/dL — ABNORMAL HIGH (ref 70–99)
HCT: 48 % (ref 39.0–52.0)

## 2012-01-06 SURGERY — TURP (TRANSURETHRAL RESECTION OF PROSTATE)
Anesthesia: General | Site: Prostate | Wound class: Clean Contaminated

## 2012-01-06 MED ORDER — SODIUM CHLORIDE 0.45 % IV SOLN
INTRAVENOUS | Status: DC
  Administered 2012-01-06: 15:00:00 via INTRAVENOUS

## 2012-01-06 MED ORDER — ALPRAZOLAM 0.25 MG PO TABS: 0.2500 mg | ORAL_TABLET | Freq: Three times a day (TID) | ORAL | Status: DC | PRN

## 2012-01-06 MED ORDER — HYDROCODONE-ACETAMINOPHEN 5-325 MG PO TABS
1.0000 | ORAL_TABLET | ORAL | Status: DC | PRN
  Administered 2012-01-06 – 2012-01-07 (×3): 2 via ORAL

## 2012-01-06 MED ORDER — CIPROFLOXACIN HCL 250 MG PO TABS
250.0000 mg | ORAL_TABLET | Freq: Two times a day (BID) | ORAL | Status: DC
  Administered 2012-01-06 (×2): 250 mg via ORAL

## 2012-01-06 MED ORDER — SODIUM CHLORIDE 0.9 % IR SOLN
Status: DC | PRN
  Administered 2012-01-06: 9000 mL

## 2012-01-06 MED ORDER — BELLADONNA ALKALOIDS-OPIUM 16.2-60 MG RE SUPP: 1.0000 | Freq: Four times a day (QID) | RECTAL | Status: DC | PRN

## 2012-01-06 MED ORDER — LIDOCAINE HCL (CARDIAC) 20 MG/ML IV SOLN
INTRAVENOUS | Status: DC | PRN
  Administered 2012-01-06: 80 mg via INTRAVENOUS

## 2012-01-06 MED ORDER — CEFAZOLIN SODIUM 1-5 GM-% IV SOLN
1.0000 g | INTRAVENOUS | Status: AC
  Administered 2012-01-06: 1 g via INTRAVENOUS

## 2012-01-06 MED ORDER — ONDANSETRON HCL 4 MG/2ML IJ SOLN: 4.0000 mg | INTRAMUSCULAR | Status: DC | PRN

## 2012-01-06 MED ORDER — ONDANSETRON HCL 4 MG/2ML IJ SOLN
INTRAMUSCULAR | Status: DC | PRN
  Administered 2012-01-06: 4 mg via INTRAVENOUS

## 2012-01-06 MED ORDER — ZOLPIDEM TARTRATE 5 MG PO TABS
5.0000 mg | ORAL_TABLET | Freq: Every evening | ORAL | Status: DC | PRN
  Administered 2012-01-06: 5 mg via ORAL

## 2012-01-06 MED ORDER — ACETAMINOPHEN 325 MG PO TABS: 650.0000 mg | ORAL_TABLET | ORAL | Status: DC | PRN

## 2012-01-06 MED ORDER — LACTATED RINGERS IV SOLN: INTRAVENOUS | Status: DC

## 2012-01-06 MED ORDER — FENTANYL CITRATE 0.05 MG/ML IJ SOLN
INTRAMUSCULAR | Status: DC | PRN
  Administered 2012-01-06 (×2): 50 ug via INTRAVENOUS

## 2012-01-06 MED ORDER — LORATADINE 10 MG PO TABS: 10.0000 mg | ORAL_TABLET | Freq: Every day | ORAL | Status: DC

## 2012-01-06 MED ORDER — FENTANYL CITRATE 0.05 MG/ML IJ SOLN: 25.0000 ug | INTRAMUSCULAR | Status: DC | PRN

## 2012-01-06 MED ORDER — PANTOPRAZOLE SODIUM 40 MG PO TBEC: 40.0000 mg | DELAYED_RELEASE_TABLET | Freq: Every day | ORAL | Status: DC

## 2012-01-06 MED ORDER — BELLADONNA ALKALOIDS-OPIUM 16.2-60 MG RE SUPP
RECTAL | Status: DC | PRN
  Administered 2012-01-06: 1 via RECTAL

## 2012-01-06 MED ORDER — PROPOFOL 10 MG/ML IV EMUL
INTRAVENOUS | Status: DC | PRN
  Administered 2012-01-06: 200 mg via INTRAVENOUS

## 2012-01-06 MED ORDER — CIPROFLOXACIN HCL 250 MG PO TABS: 250.0000 mg | ORAL_TABLET | Freq: Two times a day (BID) | ORAL | Status: AC

## 2012-01-06 MED ORDER — LACTATED RINGERS IV SOLN
INTRAVENOUS | Status: DC
  Administered 2012-01-06: 08:00:00 via INTRAVENOUS

## 2012-01-06 SURGICAL SUPPLY — 41 items
BAG DRAIN URO-CYSTO SKYTR STRL (DRAIN) ×3 IMPLANT
BAG DRN ANRFLXCHMBR STRAP LEK (BAG)
BAG DRN UROCATH (DRAIN) ×2
BAG URINE DRAINAGE (UROLOGICAL SUPPLIES) ×1 IMPLANT
BAG URINE LEG 19OZ MD ST LTX (BAG) IMPLANT
CANISTER SUCT LVC 12 LTR MEDI- (MISCELLANEOUS) IMPLANT
CATH FOLEY 2WAY SLVR  5CC 20FR (CATHETERS)
CATH FOLEY 2WAY SLVR  5CC 22FR (CATHETERS)
CATH FOLEY 2WAY SLVR 30CC 22FR (CATHETERS) IMPLANT
CATH FOLEY 2WAY SLVR 5CC 20FR (CATHETERS) IMPLANT
CATH FOLEY 2WAY SLVR 5CC 22FR (CATHETERS) IMPLANT
CATH FOLEY 3WAY 30CC 22F (CATHETERS) ×1 IMPLANT
CATH HEMA 3WAY 30CC 24FR COUDE (CATHETERS) IMPLANT
CATH HEMA 3WAY 30CC 24FR RND (CATHETERS) IMPLANT
CLOTH BEACON ORANGE TIMEOUT ST (SAFETY) ×3 IMPLANT
DRAPE CAMERA CLOSED 9X96 (DRAPES) ×3 IMPLANT
ELECT BUTTON BIOP 24F 90D PLAS (MISCELLANEOUS) IMPLANT
ELECT BUTTON HF 24-28F 2 30DE (ELECTRODE) ×2 IMPLANT
ELECT LOOP HF 26F 30D .35MM (CUTTING LOOP) IMPLANT
ELECT LOOP MED HF 24F 12D CBL (CLIP) ×1 IMPLANT
ELECT NEEDLE 45D HF 24-28F 12D (CUTTING LOOP) IMPLANT
ELECT REM PT RETURN 9FT ADLT (ELECTROSURGICAL)
ELECTRODE REM PT RTRN 9FT ADLT (ELECTROSURGICAL) ×2 IMPLANT
EVACUATOR MICROVAS BLADDER (UROLOGICAL SUPPLIES) IMPLANT
GLOVE BIO SURGEON STRL SZ8 (GLOVE) ×3 IMPLANT
GLOVE BIOGEL PI IND STRL 6.5 (GLOVE) IMPLANT
GLOVE BIOGEL PI INDICATOR 6.5 (GLOVE) ×2
GOWN PREVENTION PLUS LG XLONG (DISPOSABLE) ×3 IMPLANT
GOWN STRL REIN XL XLG (GOWN DISPOSABLE) ×3 IMPLANT
GOWN XL W/COTTON TOWEL STD (GOWNS) ×3 IMPLANT
HOLDER FOLEY CATH W/STRAP (MISCELLANEOUS) IMPLANT
IV NS IRRIG 3000ML ARTHROMATIC (IV SOLUTION) ×5 IMPLANT
KIT ASPIRATION TUBING (SET/KITS/TRAYS/PACK) IMPLANT
LOOP CUTTING 24FR OLYMPUS (CUTTING LOOP) IMPLANT
NS IRRIG 500ML POUR BTL (IV SOLUTION) ×2 IMPLANT
PACK CYSTOSCOPY (CUSTOM PROCEDURE TRAY) ×3 IMPLANT
PLUG CATH AND CAP STER (CATHETERS) IMPLANT
SET ASPIRATION TUBING (TUBING) ×3 IMPLANT
SYR 30ML LL (SYRINGE) IMPLANT
SYRINGE IRR TOOMEY STRL 70CC (SYRINGE) IMPLANT
WATER STERILE IRR 500ML POUR (IV SOLUTION) ×1 IMPLANT

## 2012-01-06 NOTE — Anesthesia Postprocedure Evaluation (Signed)
  Anesthesia Post-op Note  Patient: Randy [name]  Procedure(s) Performed: Procedure(s) (LRB): TRANSURETHRAL RESECTION OF THE PROSTATE (TURP) (N/A) TRANSURETHRAL RESECTION OF BLADDER TUMOR (TURBT) (N/A)  Patient Location: PACU  Anesthesia Type: General  Level of Consciousness: awake and alert   Airway and Oxygen Therapy: Patient Spontanous Breathing  Post-op Pain: mild  Post-op Assessment: Post-op Vital signs reviewed, Patient's Cardiovascular Status Stable, Respiratory Function Stable, Patent Airway and No signs of Nausea or vomiting  Post-op Vital Signs: stable  Complications: No apparent anesthesia complications

## 2012-01-06 NOTE — Op Note (Signed)
Preoperative diagnosis: Bladder tumor, BPH Postoperative diagnosis: Same  Procedure: Anesthetic cystoscopy, TURBT of a 1.5 cm papillary bladder lesion, TURP   Surgeon: Bertram Millard. Aksh Swart, M.D.  Anesthesia: Gen.  Specimen: Bladder tumor, prostate chips  Drains: 22 French three-way Foley catheter Complications: None  Indications:78 year old male who recently presented to my office in [place], [place] with voiding symptomatology. Cystoscopy was performed, revealing a 1.5 cm right trigonal lesion, papillary in nature, as well as an obstructive prostate. He presents at this time for TURBT and TURP. Risks and complications of the procedure have been discussed with the patient. He understands these, and desires to proceed.     Technique and findings:Patient was properly identified in the holding area, and received preoperative IV antibiotics. He was then taken to the operating room where general anesthetic was administered using the LMA. He was placed in the dorsolithotomy position, genitalia and perineum were prepped and draped. Timeout was then performed.  The procedure then commenced. A 22 French panendoscope was passed transurethrally under direct vision. Urethra was normal. Prostate was minimally to moderately obstructive. The bladder was entered and inspected circumferentially. There were 1+ scattered trabeculations. There were no foreign bodies. Ureteral orifices were normal in configuration and location. The previously mentioned ladder tumor was located right at the right bladder neck, approximately 1.5 cm inferior to the right ureteral orifice. No other bladder lesions were seen. The cystoscope was removed.  A 28 French resectoscope sheath was then placed using the [place]lake obturator. The resectoscope element with the gyrus loop was then placed. The bladder tumor was resected down to the muscular layer with the gyrus loop. The papillary lesions were sent as "bladder tumor". The base  of the bladder tumor/resection site was then cauterized. This was hemostatic.  Following this, the TURP was performed. The loop was used to resect first the median lobe which was slightly protuberant. The median lobe was resected down to the surgical capsule, including incision in the bladder neck at the 6:00 position from the bladder neck down to the verumontanum. This widely opened the patient's bladder neck. I then resected the right and then left prostatic lobes, down to the surgical capsule/smooth muscle fibers. A small capsular perforation was made on the left side, which was easily cauterized. The anterior tissue was then resected. This afforded a wide-open prostatic fossa. The cutting loop was then used to cauterize small bleeders, following which hemostasis was excellent. The chips were evacuated from the bladder and sent as "prostate chips". Reinspection of the prostatic fossa revealed adequate hemostasis. The scope was removed, and a 22 [place] Foley catheter placed. This was a three-way Foley, with 30 cc of water placed in the balloon. This was hooked to CBI using normal saline.  The patient tolerated procedure well. He was awakened and taken to the PACU in stable condition.

## 2012-01-06 NOTE — H&P (Signed)
Urology History and Physical Exam  CC: Bladder tumor, difficulty voiding  HPI: 78 year old male presents for TUR-BT and TUR-P. He originally was seen on 12/17/2011 in our [place] office. His presenting history is as follows:  He was sent by Dr. Lilyan Punt for evaluation and management of lower urinary tract symptoms. He has been on tamsulosin and finasteride for quite some time. Despite this, he is having progressive urinary issues including hesitancy, intermittency, weak stream and feeling of incomplete emptying of his bladder. He has nocturia x2-3, which is not terribly bothersome. He denies any gross hematuria or dysuria. He has not been treated for any urinary tract infections recently.   Cystoscopy revealed an obstructive prostate as well as a 1.5 cm bladder tumor.   PMH: Past Medical History  Diagnosis Date  . History of BPH   . DJD (degenerative joint disease)     cervical  . GERD (gastroesophageal reflux disease)     takes prilosec intermittently  . Complication of anesthesia     claustraphobia  . Anxiety     claustraphobia    PSH: Past Surgical History  Procedure Date  . Cataract extraction, bilateral   . Colonoscopy   . Hiatal hernia repair 2011  . Retinal detachment surgery 1988  . Cholecystectomy 1984  . Cardiovascular stress test 2011    Allergies: No Known Allergies  Medications: No prescriptions prior to admission     Social History: History   Social History  . Marital Status: Married    Spouse Name: N/A    Number of Children: N/A  . Years of Education: N/A   Occupational History  . Retired    Social History Main Topics  . Smoking status: Former Smoker    Quit date: 12/26/1986  . Smokeless tobacco: Not on file  . Alcohol Use: No  . Drug Use: No  . Sexually Active: Not on file   Other Topics Concern  . Not on file   Social History Narrative   Exercises regularly    Family History: Family History  Problem Relation Age of  Onset  . Stroke Mother   . Kidney failure Father     Review of Systems: Constitutional: feeling tired (fatigue).  Integumentary: pruritus.  Eyes: blurred vision.  ENT: sinus problems.  Hematologic/Lymphatic: a tendency to easily bruise.  Respiratory: shortness of breath and cough.  Musculoskeletal: joint pain.  Neurological: dizziness.  Psychiatric: anxiety.     Physical Exam: @VITALS2 @ Constitutional: Well nourished and well developed . No acute distress.  ENT:. The ears and nose are normal in appearance.  Neck: The appearance of the neck is normal and no neck mass is present.  Pulmonary: No respiratory distress and normal respiratory rhythm and effort.  Cardiovascular: Heart rate and rhythm are normal . No peripheral edema.  Abdomen: right upper quadrant, subcostal incision site(s) well healed. The abdomen is flat. The abdomen is soft and nontender. No masses are palpated. No CVA tenderness.  A right inguinal hernia is present, which is reducible.  No inguinal hernia is present on the left. No hepatosplenomegaly noted.  Rectal: Rectal exam demonstrates normal sphincter tone, the anus is normal on inspection., no tenderness and no masses. Prostate size is estimated to be 40 g. Normal rectal tone, no rectal masses, prostate is smooth, symmetric and non-tender. The prostate has no nodularity and is not tender. The left seminal vesicle is nonpalpable. The right seminal vesicle is nonpalpable. The perineum is normal on inspection.  Genitourinary: Examination of  the penis demonstrates no discharge, no masses, no lesions and a normal meatus. The penis is uncircumcised. The scrotum is without lesions. The right epididymis is palpably normal and non-tender. The left epididymis is palpably normal and non-tender. The right testis is palpably normal, non-tender and without masses. The left testis is normal, non-tender and without masses.  Studies:  No results found for this basename:  HGB:2,WBC:2,PLT:2 in the last 72 hours  No results found for this basename: NA:2,K:2,CL:2,CO2:2,BUN:2,CREATININE:2,CALCIUM:2,MAGNESIUM:2,GFRNONAA:2,GFRAA:2 in the last 72 hours   No results found for this basename: PT:2,INR:2,APTT:2 in the last 72 hours   No components found with this basename: ABG:2    Assessment:     1. BPH. The patient is fairly symptomatic, despite him being on finasteride and tamsulosin. He is obstructed based on the cystoscopic appearance  2. Bladder tumor, right sidewall. This was unexpected, found incidentally upon cystoscopy for his voiding symptoms.    Plan:  1. TUR-BT  2. Concurrent TUR-P

## 2012-01-06 NOTE — Anesthesia Procedure Notes (Signed)
Procedure Name: LMA Insertion Date/Time: 01/06/2012 9:14 AM Performed by: Fran Lowes Pre-anesthesia Checklist: Patient identified, Emergency Drugs available, Suction available and Patient being monitored Patient Re-evaluated:Patient Re-evaluated prior to inductionOxygen Delivery Method: Circle System Utilized Preoxygenation: Pre-oxygenation with 100% oxygen Intubation Type: IV induction Ventilation: Mask ventilation without difficulty LMA: LMA inserted LMA Size: 4.0 Number of attempts: 1 Airway Equipment and Method: bite block Placement Confirmation: positive ETCO2 Tube secured with: Tape Dental Injury: Teeth and Oropharynx as per pre-operative assessment

## 2012-01-06 NOTE — Transfer of Care (Signed)
Immediate Anesthesia Transfer of Care Note  Patient: Randy [name]  Procedure(s) Performed: Procedure(s) (LRB): TRANSURETHRAL RESECTION OF THE PROSTATE (TURP) (N/A) TRANSURETHRAL RESECTION OF BLADDER TUMOR (TURBT) (N/A)  Patient Location: Patient transported to PACU with oxygen via face mask at 4 Liters / Min  Anesthesia Type: General  Level of Consciousness: awake and alert   Airway & Oxygen Therapy: Patient Spontanous Breathing and Patient connected to face mask oxygen  Post-op Assessment: Report given to PACU RN and Post -op Vital signs reviewed and stable  Post vital signs: Reviewed and stable  Dentition: Teeth and oropharynx remain in pre-op condition  Complications: No apparent anesthesia complications

## 2012-01-07 ENCOUNTER — Encounter (HOSPITAL_BASED_OUTPATIENT_CLINIC_OR_DEPARTMENT_OTHER): Payer: Self-pay | Admitting: Urology

## 2012-01-07 ENCOUNTER — Emergency Department (HOSPITAL_COMMUNITY)
Admission: EM | Admit: 2012-01-07 | Discharge: 2012-01-08 | Disposition: A | Discharge status: Home / Self Care | Payer: Medicare Other | Attending: Emergency Medicine | Admitting: Emergency Medicine

## 2012-01-07 DIAGNOSIS — Z87891 Personal history of nicotine dependence: Secondary | ICD-10-CM | POA: Insufficient documentation

## 2012-01-07 DIAGNOSIS — Z9889 Other specified postprocedural states: Secondary | ICD-10-CM | POA: Insufficient documentation

## 2012-01-07 DIAGNOSIS — Z823 Family history of stroke: Secondary | ICD-10-CM | POA: Insufficient documentation

## 2012-01-07 DIAGNOSIS — K219 Gastro-esophageal reflux disease without esophagitis: Secondary | ICD-10-CM | POA: Insufficient documentation

## 2012-01-07 DIAGNOSIS — R339 Retention of urine, unspecified: Secondary | ICD-10-CM

## 2012-01-07 DIAGNOSIS — Z841 Family history of disorders of kidney and ureter: Secondary | ICD-10-CM | POA: Insufficient documentation

## 2012-01-07 DIAGNOSIS — F411 Generalized anxiety disorder: Secondary | ICD-10-CM | POA: Insufficient documentation

## 2012-01-07 DIAGNOSIS — M5030 Other cervical disc degeneration, unspecified cervical region: Secondary | ICD-10-CM | POA: Insufficient documentation

## 2012-01-07 MED ORDER — CIPROFLOXACIN HCL 250 MG PO TABS: 250.0000 mg | ORAL_TABLET | Freq: Two times a day (BID) | ORAL | Status: AC

## 2012-01-07 NOTE — ED Notes (Signed)
States he had a TURP procedure, now not able to pass urine.

## 2012-01-07 NOTE — ED Notes (Signed)
Pt tolerated foley catheter insertion well, immediate return of pink urine approx , continues to drain dark yellow urine.

## 2012-01-07 NOTE — ED Provider Notes (Signed)
History   This chart was scribed for [employer]. Colon Branch, MD by Sofie Rower. The patient was seen in room APA11/APA11 and the patient's care was started at 11:31 PM     CSN: [id number]  Arrival date & time 01/07/12  2222   None     Chief Complaint  Patient presents with  . Urinary Retention    (Consider location/radiation/quality/duration/timing/severity/associated sxs/prior treatment) HPI  Randy [name] is a 78 y.o. male who presents to the Emergency Department complaining of moderate  difficulty urinating onset today. The pt reports that he came home today  S/p TURP yesterday and was having difficulty urinating. The last time the pt urinated was three to four hours ago.   PCP is Dr. Gerda Diss.  Urologist Dr. Vernice Jefferson  Past Medical History  Diagnosis Date  . History of BPH   . DJD (degenerative joint disease)     cervical  . GERD (gastroesophageal reflux disease)     takes prilosec intermittently  . Complication of anesthesia     claustraphobia  . Anxiety     claustraphobia    Past Surgical History  Procedure Date  . Cataract extraction, bilateral   . Colonoscopy   . Hiatal hernia repair 2011  . Retinal detachment surgery 1988  . Cholecystectomy 1984  . Cardiovascular stress test 2011  . Transurethral resection of prostate 01/06/2012    Procedure: TRANSURETHRAL RESECTION OF THE PROSTATE (TURP);  Surgeon: Marcine Matar, MD;  Location: [facility];  Service: Urology;  Laterality: N/A;  . Transurethral resection of bladder tumor 01/06/2012    Procedure: TRANSURETHRAL RESECTION OF BLADDER TUMOR (TURBT);  Surgeon: Marcine Matar, MD;  Location: [facility];  Service: Urology;  Laterality: N/A;  . Transurethral resection of prostate     Family History  Problem Relation Age of Onset  . Stroke Mother   . Kidney failure Father     History  Substance Use Topics  . Smoking status: Former Smoker    Quit date: 12/26/1986  . Smokeless tobacco:  Not on file  . Alcohol Use: No      Review of Systems  Constitutional: Negative for fever.       10 Systems reviewed and are negative for acute change except as noted in the HPI.  HENT: Negative for congestion.   Eyes: Negative for discharge and redness.  Respiratory: Negative for cough and shortness of breath.   Cardiovascular: Negative for chest pain.  Gastrointestinal: Negative for vomiting and abdominal pain.  Genitourinary: Positive for difficulty urinating.  Musculoskeletal: Negative for back pain.  Skin: Negative for rash.  Neurological: Negative for syncope, numbness and headaches.  Psychiatric/Behavioral:       No behavior change.  All other systems reviewed and are negative.       Allergies  Review of patient's allergies indicates no known allergies.  Home Medications   Current Outpatient Rx  Name Route Sig Dispense Refill  . ACETAMINOPHEN 325 MG PO TABS Oral Take 650 mg by mouth as needed.    . ALPRAZOLAM 0.25 MG PO TABS Oral Take 0.25 mg by mouth as needed.    Marland Kitchen CIPROFLOXACIN HCL 250 MG PO TABS Oral Take 1 tablet (250 mg total) by mouth 2 (two) times daily. 6 tablet 0  . CIPROFLOXACIN HCL 250 MG PO TABS Oral Take 1 tablet (250 mg total) by mouth 2 (two) times daily.    Marland Kitchen FEXOFENADINE HCL 180 MG PO TABS Oral Take 180 mg by mouth daily.    Marland Kitchen  OMEPRAZOLE 20 MG PO CPDR Oral Take 20 mg by mouth as needed.     Marland Kitchen VITAMIN B-12 1000 MCG PO TABS Oral Take 1,000 mcg by mouth daily.        BP 150/116  Pulse 94  Temp 97.9 F (36.6 C) (Oral)  Resp 16  Ht 5\' 7"  (1.702 m)  Wt 155 lb (70.308 kg)  BMI 24.28 kg/m2  SpO2 100%  Physical Exam  Nursing note and vitals reviewed. Constitutional: He is oriented to person, place, and time. He appears well-developed and well-nourished.       Awake, alert, nontoxic appearance.  HENT:  Head: Atraumatic.  Nose: Nose normal.  Eyes: Right eye exhibits no discharge. Left eye exhibits no discharge.  Neck: Neck supple.    Cardiovascular: Regular rhythm and normal heart sounds.  Tachycardia present.   Pulmonary/Chest: Effort normal. He has no wheezes. He exhibits no tenderness.  Abdominal: Soft. There is no tenderness. There is no rebound.  Genitourinary:       Bladder is distended. Lower abdominal pain with palpation.  Musculoskeletal: Normal range of motion. He exhibits no tenderness.       Baseline ROM, no obvious new focal weakness.  Neurological: He is alert and oriented to person, place, and time.       Mental status and motor strength appears baseline for patient and situation.  Skin: Skin is warm and dry. No rash noted.  Psychiatric: He has a normal mood and affect. His behavior is normal.    ED Course  Procedures (including critical care time)  DIAGNOSTIC STUDIES: Oxygen Saturation is 100% on room air, normal by my interpretation.    COORDINATION OF CARE:    11:33PM EDP at bedside discusses treatment plan concerning application of catheter.   Labs Reviewed - No data to display Dg Chest 2 View  01/06/2012  *RADIOLOGY REPORT*  Clinical Data: Preop.  Cough/congestion.  Ex-smoker.  CHEST - 2 VIEW  Comparison: 09/04/2009  Findings: Moderate and progressive right hemidiaphragm elevation. Midline trachea.  Mild cardiomegaly with atherosclerosis in the transverse aorta. No pleural effusion or pneumothorax.  Left apical pleural thickening. No congestive failure.  Mild volume loss at right lung base.  Surgical clips in the right upper quadrant.  IMPRESSION: No acute cardiopulmonary disease.  Progressive moderate right hemidiaphragm elevation with adjacent volume loss.  Original Report Authenticated By: Consuello Bossier, M.D.        MDM  Patient s/p TURP 01/06/2012 here with urinary retention. Foley catheter placed with return of 1600 cc urine. Patient feels better. Catheter to remain until removed by urology. Pt stable in ED with no significant deterioration in condition.The patient appears reasonably  screened and/or stabilized for discharge and I doubt any other medical condition or other [facility] requiring further screening, evaluation, or treatment in the ED at this time prior to discharge.  Pt stable in ED with no significant deterioration in condition.The patient appears reasonably screened and/or stabilized for discharge and I doubt any other medical condition or other [facility] requiring further screening, evaluation, or treatment in the ED at this time prior to discharge.  MDM Reviewed: nursing note, vitals and previous chart           Nicoletta Dress. Colon Branch, MD 01/08/12 [id number]

## 2012-01-08 DIAGNOSIS — Z9889 Other specified postprocedural states: Secondary | ICD-10-CM | POA: Diagnosis not present

## 2012-01-08 DIAGNOSIS — Z87891 Personal history of nicotine dependence: Secondary | ICD-10-CM | POA: Diagnosis not present

## 2012-01-08 DIAGNOSIS — M5030 Other cervical disc degeneration, unspecified cervical region: Secondary | ICD-10-CM | POA: Diagnosis not present

## 2012-01-08 DIAGNOSIS — Z823 Family history of stroke: Secondary | ICD-10-CM | POA: Diagnosis not present

## 2012-01-08 DIAGNOSIS — F411 Generalized anxiety disorder: Secondary | ICD-10-CM | POA: Diagnosis not present

## 2012-01-08 DIAGNOSIS — R339 Retention of urine, unspecified: Secondary | ICD-10-CM | POA: Diagnosis not present

## 2012-01-08 DIAGNOSIS — Z841 Family history of disorders of kidney and ureter: Secondary | ICD-10-CM | POA: Diagnosis not present

## 2012-01-08 DIAGNOSIS — K219 Gastro-esophageal reflux disease without esophagitis: Secondary | ICD-10-CM | POA: Diagnosis not present

## 2012-01-08 NOTE — ED Notes (Signed)
Discharge instructions reviewed with pt, questions answered. Pt verbalized understanding.  

## 2012-01-08 NOTE — ED Notes (Signed)
Collected and emptied catheter bag, pink/red clear urine with no clots. Pt stated "I feel much better now".

## 2012-01-09 DIAGNOSIS — C670 Malignant neoplasm of trigone of bladder: Secondary | ICD-10-CM | POA: Diagnosis not present

## 2012-01-09 DIAGNOSIS — N401 Enlarged prostate with lower urinary tract symptoms: Secondary | ICD-10-CM | POA: Diagnosis not present

## 2012-01-15 DIAGNOSIS — R3915 Urgency of urination: Secondary | ICD-10-CM | POA: Diagnosis not present

## 2012-01-15 DIAGNOSIS — R82998 Other abnormal findings in urine: Secondary | ICD-10-CM | POA: Diagnosis not present

## 2012-02-05 DIAGNOSIS — R300 Dysuria: Secondary | ICD-10-CM | POA: Diagnosis not present

## 2012-02-05 DIAGNOSIS — N390 Urinary tract infection, site not specified: Secondary | ICD-10-CM | POA: Diagnosis not present

## 2012-02-18 ENCOUNTER — Ambulatory Visit (INDEPENDENT_AMBULATORY_CARE_PROVIDER_SITE_OTHER): Payer: Medicare Other | Admitting: Urology

## 2012-02-18 DIAGNOSIS — N400 Enlarged prostate without lower urinary tract symptoms: Secondary | ICD-10-CM

## 2012-02-18 DIAGNOSIS — C679 Malignant neoplasm of bladder, unspecified: Secondary | ICD-10-CM | POA: Diagnosis not present

## 2012-02-18 DIAGNOSIS — N401 Enlarged prostate with lower urinary tract symptoms: Secondary | ICD-10-CM | POA: Diagnosis not present

## 2012-03-26 DIAGNOSIS — E782 Mixed hyperlipidemia: Secondary | ICD-10-CM | POA: Diagnosis not present

## 2012-03-26 DIAGNOSIS — Z79899 Other long term (current) drug therapy: Secondary | ICD-10-CM | POA: Diagnosis not present

## 2012-03-26 DIAGNOSIS — R5381 Other malaise: Secondary | ICD-10-CM | POA: Diagnosis not present

## 2012-03-26 DIAGNOSIS — R7301 Impaired fasting glucose: Secondary | ICD-10-CM | POA: Diagnosis not present

## 2012-03-26 DIAGNOSIS — Z125 Encounter for screening for malignant neoplasm of prostate: Secondary | ICD-10-CM | POA: Diagnosis not present

## 2012-04-09 DIAGNOSIS — Z0000 Encounter for general adult medical examination without abnormal findings: Secondary | ICD-10-CM | POA: Diagnosis not present

## 2012-04-14 ENCOUNTER — Ambulatory Visit (INDEPENDENT_AMBULATORY_CARE_PROVIDER_SITE_OTHER): Payer: Medicare Other | Admitting: Urology

## 2012-04-14 DIAGNOSIS — N400 Enlarged prostate without lower urinary tract symptoms: Secondary | ICD-10-CM | POA: Diagnosis not present

## 2012-04-14 DIAGNOSIS — N320 Bladder-neck obstruction: Secondary | ICD-10-CM | POA: Diagnosis not present

## 2012-04-14 DIAGNOSIS — C679 Malignant neoplasm of bladder, unspecified: Secondary | ICD-10-CM | POA: Diagnosis not present

## 2012-04-15 ENCOUNTER — Other Ambulatory Visit: Payer: Self-pay | Admitting: Urology

## 2012-04-28 ENCOUNTER — Encounter (HOSPITAL_BASED_OUTPATIENT_CLINIC_OR_DEPARTMENT_OTHER): Payer: Self-pay | Admitting: *Deleted

## 2012-04-28 NOTE — Progress Notes (Signed)
NPO AFTER MN. ARRIVES AT 1015. NEEDS HG. CURRENT EKG AND CXR IN EPIC AND CHART.  WILL TAKE PRILOSEC AND XANAX AM OF SURG W/ SIP OF WATER.

## 2012-04-30 DIAGNOSIS — I959 Hypotension, unspecified: Secondary | ICD-10-CM | POA: Diagnosis not present

## 2012-04-30 DIAGNOSIS — I491 Atrial premature depolarization: Secondary | ICD-10-CM | POA: Diagnosis not present

## 2012-05-01 ENCOUNTER — Encounter (HOSPITAL_BASED_OUTPATIENT_CLINIC_OR_DEPARTMENT_OTHER): Payer: Self-pay | Admitting: *Deleted

## 2012-05-01 ENCOUNTER — Encounter (HOSPITAL_BASED_OUTPATIENT_CLINIC_OR_DEPARTMENT_OTHER)
Admission: RE | Disposition: A | Discharge status: Home / Self Care | Payer: Self-pay | Source: Ambulatory Visit | Attending: Urology

## 2012-05-01 ENCOUNTER — Ambulatory Visit (HOSPITAL_BASED_OUTPATIENT_CLINIC_OR_DEPARTMENT_OTHER)
Admission: RE | Admit: 2012-05-01 | Discharge: 2012-05-01 | Disposition: A | Discharge status: Home / Self Care | Payer: Medicare Other | Source: Ambulatory Visit | Attending: Urology | Admitting: Urology

## 2012-05-01 ENCOUNTER — Encounter (HOSPITAL_BASED_OUTPATIENT_CLINIC_OR_DEPARTMENT_OTHER): Payer: Self-pay | Admitting: Anesthesiology

## 2012-05-01 ENCOUNTER — Ambulatory Visit (HOSPITAL_BASED_OUTPATIENT_CLINIC_OR_DEPARTMENT_OTHER): Payer: Medicare Other | Admitting: Anesthesiology

## 2012-05-01 DIAGNOSIS — N35919 Unspecified urethral stricture, male, unspecified site: Secondary | ICD-10-CM | POA: Insufficient documentation

## 2012-05-01 DIAGNOSIS — C679 Malignant neoplasm of bladder, unspecified: Secondary | ICD-10-CM | POA: Diagnosis not present

## 2012-05-01 DIAGNOSIS — D090 Carcinoma in situ of bladder: Secondary | ICD-10-CM | POA: Insufficient documentation

## 2012-05-01 DIAGNOSIS — N320 Bladder-neck obstruction: Secondary | ICD-10-CM | POA: Diagnosis not present

## 2012-05-01 DIAGNOSIS — N3289 Other specified disorders of bladder: Secondary | ICD-10-CM | POA: Diagnosis not present

## 2012-05-01 DIAGNOSIS — D414 Neoplasm of uncertain behavior of bladder: Secondary | ICD-10-CM | POA: Diagnosis not present

## 2012-05-01 HISTORY — DX: Hesitancy of micturition: R39.11

## 2012-05-01 HISTORY — DX: Bladder-neck obstruction: N32.0

## 2012-05-01 HISTORY — PX: TRANSURETHRAL RESECTION OF BLADDER NECK: SHX6196

## 2012-05-01 HISTORY — DX: Dysuria: R30.0

## 2012-05-01 SURGERY — RESECTION, BLADDER NECK, TRANSURETHRAL
Anesthesia: General | Site: Bladder | Wound class: Clean Contaminated

## 2012-05-01 MED ORDER — FENTANYL CITRATE 0.05 MG/ML IJ SOLN
INTRAMUSCULAR | Status: DC | PRN
  Administered 2012-05-01: 50 ug via INTRAVENOUS
  Administered 2012-05-01 (×2): 25 ug via INTRAVENOUS

## 2012-05-01 MED ORDER — ONDANSETRON HCL 4 MG/2ML IJ SOLN
INTRAMUSCULAR | Status: DC | PRN
  Administered 2012-05-01: 4 mg via INTRAVENOUS

## 2012-05-01 MED ORDER — LIDOCAINE HCL (CARDIAC) 20 MG/ML IV SOLN
INTRAVENOUS | Status: DC | PRN
  Administered 2012-05-01: 60 mg via INTRAVENOUS

## 2012-05-01 MED ORDER — DEXAMETHASONE SODIUM PHOSPHATE 4 MG/ML IJ SOLN
INTRAMUSCULAR | Status: DC | PRN
  Administered 2012-05-01: 8 mg via INTRAVENOUS

## 2012-05-01 MED ORDER — HYDROCODONE-ACETAMINOPHEN 5-500 MG PO CAPS: 1.0000 | ORAL_CAPSULE | ORAL | Status: DC | PRN

## 2012-05-01 MED ORDER — PROMETHAZINE HCL 25 MG/ML IJ SOLN
6.2500 mg | INTRAMUSCULAR | Status: DC | PRN
  Filled 2012-05-01: qty 1

## 2012-05-01 MED ORDER — FENTANYL CITRATE 0.05 MG/ML IJ SOLN
25.0000 ug | INTRAMUSCULAR | Status: DC | PRN
  Filled 2012-05-01: qty 1

## 2012-05-01 MED ORDER — SODIUM CHLORIDE 0.9 % IR SOLN
Status: DC | PRN
  Administered 2012-05-01: 6000 mL

## 2012-05-01 MED ORDER — CIPROFLOXACIN IN D5W 400 MG/200ML IV SOLN
400.0000 mg | INTRAVENOUS | Status: AC
  Administered 2012-05-01: 400 mg via INTRAVENOUS
  Filled 2012-05-01: qty 200

## 2012-05-01 MED ORDER — PROPOFOL 10 MG/ML IV BOLUS
INTRAVENOUS | Status: DC | PRN
Start: 2012-05-01 — End: 2012-05-01
  Administered 2012-05-01: 150 mg via INTRAVENOUS

## 2012-05-01 MED ORDER — LACTATED RINGERS IV SOLN
INTRAVENOUS | Status: DC
  Administered 2012-05-01: 100 mL/h via INTRAVENOUS
  Filled 2012-05-01: qty 1000

## 2012-05-01 MED ORDER — CIPROFLOXACIN HCL 250 MG PO TABS: 250.0000 mg | ORAL_TABLET | Freq: Two times a day (BID) | ORAL | Status: DC

## 2012-05-01 SURGICAL SUPPLY — 38 items
BAG DRAIN URO-CYSTO SKYTR STRL (DRAIN) ×2 IMPLANT
BAG DRN ANRFLXCHMBR STRAP LEK (BAG) ×1
BAG DRN UROCATH (DRAIN) ×1
BAG URINE DRAINAGE (UROLOGICAL SUPPLIES) IMPLANT
BAG URINE LEG 19OZ MD ST LTX (BAG) ×1 IMPLANT
CANISTER SUCT LVC 12 LTR MEDI- (MISCELLANEOUS) ×2 IMPLANT
CATH FOLEY 2WAY SLVR  5CC 18FR (CATHETERS) ×1
CATH FOLEY 2WAY SLVR  5CC 20FR (CATHETERS)
CATH FOLEY 2WAY SLVR  5CC 22FR (CATHETERS)
CATH FOLEY 2WAY SLVR 30CC 22FR (CATHETERS) IMPLANT
CATH FOLEY 2WAY SLVR 5CC 18FR (CATHETERS) IMPLANT
CATH FOLEY 2WAY SLVR 5CC 20FR (CATHETERS) IMPLANT
CATH FOLEY 2WAY SLVR 5CC 22FR (CATHETERS) IMPLANT
CATH FOLEY 3WAY 30CC 22F (CATHETERS) IMPLANT
CATH HEMA 3WAY 30CC 24FR COUDE (CATHETERS) IMPLANT
CATH HEMA 3WAY 30CC 24FR RND (CATHETERS) IMPLANT
CLOTH BEACON ORANGE TIMEOUT ST (SAFETY) ×2 IMPLANT
DRAPE CAMERA CLOSED 9X96 (DRAPES) ×2 IMPLANT
ELECT BUTTON BIOP 24F 90D PLAS (MISCELLANEOUS) IMPLANT
ELECT BUTTON HF 24-28F 2 30DE (ELECTRODE) ×1 IMPLANT
ELECT LOOP HF 26F 30D .35MM (CUTTING LOOP) IMPLANT
ELECT NEEDLE 45D HF 24-28F 12D (CUTTING LOOP) ×1 IMPLANT
ELECT REM PT RETURN 9FT ADLT (ELECTROSURGICAL)
ELECTRODE REM PT RTRN 9FT ADLT (ELECTROSURGICAL) ×1 IMPLANT
EVACUATOR MICROVAS BLADDER (UROLOGICAL SUPPLIES) IMPLANT
GLOVE BIO SURGEON STRL SZ 6.5 (GLOVE) ×1 IMPLANT
GLOVE BIO SURGEON STRL SZ8 (GLOVE) ×2 IMPLANT
GLOVE ECLIPSE 6.0 STRL STRAW (GLOVE) ×1 IMPLANT
GOWN PREVENTION PLUS LG XLONG (DISPOSABLE) ×2 IMPLANT
GOWN STRL REIN XL XLG (GOWN DISPOSABLE) ×2 IMPLANT
HOLDER FOLEY CATH W/STRAP (MISCELLANEOUS) IMPLANT
KIT ASPIRATION TUBING (SET/KITS/TRAYS/PACK) ×1 IMPLANT
LOOP CUTTING 24FR OLYMPUS (CUTTING LOOP) IMPLANT
PACK CYSTOSCOPY (CUSTOM PROCEDURE TRAY) ×2 IMPLANT
PLUG CATH AND CAP STER (CATHETERS) IMPLANT
SET ASPIRATION TUBING (TUBING) IMPLANT
SYR 30ML LL (SYRINGE) IMPLANT
SYRINGE IRR TOOMEY STRL 70CC (SYRINGE) IMPLANT

## 2012-05-01 NOTE — Anesthesia Postprocedure Evaluation (Signed)
  Anesthesia Post-op Note  Patient: Randy [name]  Procedure(s) Performed: Procedure(s) (LRB): TRANSURETHRAL RESECTION OF BLADDER NECK (N/A)  Patient Location: PACU  Anesthesia Type: General  Level of Consciousness: awake and alert   Airway and Oxygen Therapy: Patient Spontanous Breathing  Post-op Pain: mild  Post-op Assessment: Post-op Vital signs reviewed, Patient's Cardiovascular Status Stable, Respiratory Function Stable, Patent Airway and No signs of Nausea or vomiting  Post-op Vital Signs: stable  Complications: No apparent anesthesia complications

## 2012-05-01 NOTE — Transfer of Care (Signed)
Immediate Anesthesia Transfer of Care Note  Patient: Randy [name]  Procedure(s) Performed: Procedure(s) (LRB): TRANSURETHRAL RESECTION OF BLADDER NECK (N/A)  Patient Location: PACU  Anesthesia Type: General  Level of Consciousness: awake, oriented, sedated and patient cooperative  Airway & Oxygen Therapy: Patient Spontanous Breathing and Patient connected to face mask oxygen  Post-op Assessment: Report given to PACU RN and Post -op Vital signs reviewed and stable  Post vital signs: Reviewed and stable  Complications: No apparent anesthesia complications

## 2012-05-01 NOTE — Anesthesia Preprocedure Evaluation (Signed)
Anesthesia Evaluation  Patient identified by MRN, date of birth, ID band Patient awake    Reviewed: Allergy & Precautions, H&P , NPO status , Patient's Chart, lab work & pertinent test results  Airway Mallampati: II TM Distance: <3 FB Neck ROM: Full    Dental No notable dental hx.    Pulmonary neg pulmonary ROS,  breath sounds clear to auscultation  Pulmonary exam normal       Cardiovascular negative cardio ROS  Rhythm:Regular Rate:Normal     Neuro/Psych Anxiety negative neurological ROS     GI/Hepatic Neg liver ROS, GERD-  Medicated,  Endo/Other  negative endocrine ROS  Renal/GU negative Renal ROS  negative genitourinary   Musculoskeletal negative musculoskeletal ROS (+)   Abdominal   Peds negative pediatric ROS (+)  Hematology negative hematology ROS (+)   Anesthesia Other Findings   Reproductive/Obstetrics negative OB ROS                           Anesthesia Physical Anesthesia Plan  ASA: II  Anesthesia Plan: General   Post-op Pain Management:    Induction: Intravenous  Airway Management Planned: LMA  Additional Equipment:   Intra-op Plan:   Post-operative Plan:   Informed Consent: I have reviewed the patients History and Physical, chart, labs and discussed the procedure including the risks, benefits and alternatives for the proposed anesthesia with the patient or authorized representative who has indicated his/her understanding and acceptance.   Dental advisory given  Plan Discussed with: CRNA and Surgeon  Anesthesia Plan Comments: (4 LMA used previously)        Anesthesia Quick Evaluation

## 2012-05-01 NOTE — Op Note (Signed)
Preoperative diagnosis: Urethral stricture, status post TURP and TURBT  Postoperative diagnosis: Same   Procedure: Cystoscopy, incision of bladder neck contracture, bladder biopsy, urethral dilation    Surgeon: Bertram Millard. Dorris Vangorder, M.D.   Anesthesia: Gen.   Complications: None  Specimen(s): Bladder washings, bladder biopsies  Drain(s): 20 French Foley catheter to leg bag  Indications: 78 year old male status post TURP and TURBT in July, 2013 for obstructive BPH and a bladder tumor. 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 Recent surveillance cystoscopy was performed and was impossible because the patient has a significant bladder neck contracture. He presents at this time for treatment of that and surveillance cystoscopy    Technique and findings: The patient was identified in the holding area and received preoperative IV antibiotics. He was taken to the operating room where general anesthesia was administered with the LMA. He was placed in the dorsolithotomy position. Genitalia and perineum were prepped and draped. Timeout was then performed.  A 22 French panendoscope was then advanced under direct vision through his urethra. There was a bulbous urethral stricture which would not allow passage of the cystoscope. I then passed a resectoscope sheath with the obturator up to this area. I did not have a balloon dilator at my disposal, so I used the [employer] to incise first the bulbous urethral stricture. I was unable to negotiate the resectoscope more proximally, and then resected a very tight bladder neck contracture. This was easily opened. I then removed the resectoscope and placed a 22 French cystoscope. The bladder was inspected circumferentially. There were moderate trabeculations. There was some erythematous urothelium on the left side, but no discrete bladder tumors were seen. It irrigated the bladder with saline and sent this is bladder  washings for cytology. I then biopsied the erythematous site which was approximately 1 cm in size on the left bladder wall. This was sent as bladder biopsy. 2 separate biopsies were taken with the cold cup forceps. This area was then cauterized. I then reinspected the entire bladder with both the 12 and 70 lenses. No further lesions were seen. I then removed the scope and dilated the urethra to 30 [place] with [employer]. This was done without difficulty. I then placed a 20 French Foley catheter and hooked this to dependent drainage. At this point the procedure was terminated. The patient was awakened and taken to the PACU in stable condition.

## 2012-05-01 NOTE — H&P (Signed)
Urology History and Physical Exam  CC: Difficulty urinating  HPI: 78 year old male presents today for elective cystoscopy with balloon dilation of a bladder neck contracture following a TUR-P and TUR-BT. His history is as follows:  He underwent combined TURBT and TURP on 01/06/2012.  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 There was no evidence of invasion regarding the urothelial carcinoma.  Recently, he has had a slow stream, feeling of incomplete emptying and frequency. This is getting worse. He is having no dysuria or gross hematuria. Cystoscopy a couple of weeks ago revealed a significant BNC.  PMH: Past Medical History  Diagnosis Date  . History of BPH   . DJD (degenerative joint disease)     cervical  . GERD (gastroesophageal reflux disease)     takes prilosec intermittently  . Anxiety     claustraphobia  . Complication of anesthesia     claustraphobia  . BNC (bladder neck contracture)   . Dysuria OCCASIONAL  . Urinary hesitancy     PSH: Past Surgical History  Procedure Date  . Retinal detachment surgery 1988  . Cardiovascular stress test 11-08-2009  DR ROTHBART    LOW RISK MYOVIEW/ NO ISCHEMIA/ LVEF 64%  . Transurethral resection of bladder tumor 01/06/2012    Procedure: TRANSURETHRAL RESECTION OF BLADDER TUMOR (TURBT);  Surgeon: Marcine Matar, MD;  [facility]: [facility];  Service: Urology;  Laterality: N/A;  . Transurethral resection of prostate   . Laparoscopic repair large hiatal hernia w/ mesh and nissen fundoplication 03-27-2010  . Cholecystectomy 1984    OPEN  . Cataract extraction w/ intraocular lens  implant, bilateral     Allergies: No Known Allergies  Medications: No prescriptions prior to admission     Social History: History   Social History  . Marital Status: Married    Spouse Name: N/A    Number of Children: N/A  . Years of Education: N/A    Occupational History  . Retired    Social History Main Topics  . Smoking status: Former Smoker -- 40 years    Quit date: 12/26/1986  . Smokeless tobacco: Never Used  . Alcohol Use: No  . Drug Use: No  . Sexually Active: Not on file   Other Topics Concern  . Not on file   Social History Narrative   Exercises regularly    Family History: Family History  Problem Relation Age of Onset  . Stroke Mother   . Kidney failure Father     Review of Systems: Positive: Decreased stream, other LUTS. Negative:   A further 10 point review of systems was negative except what is listed in the HPI.  Physical Exam: @VITALS2 @ General: No acute distress.  Awake. Head:  Normocephalic.  Atraumatic. ENT:  EOMI.  Mucous membranes moist Neck:  Supple.  No lymphadenopathy. CV:  S1 present. S2 present. Regular rate. Pulmonary: Equal effort bilaterally.  Clear to auscultation bilaterally. Abdomen: Soft.  Nontender to palpation. Skin:  Normal turgor.  No visible rash. Extremity: No gross deformity of bilateral upper extremities.  No gross deformity of    bilateral lower extremities. Neurologic: Alert. Appropriate mood.    Studies:  No results found for this basename: HGB:2,WBC:2,PLT:2 in the last 72 hours  No results found for this basename: NA:2,K:2,CL:2,CO2:2,BUN:2,CREATININE:2,CALCIUM:2,MAGNESIUM:2,GFRNONAA:2,GFRAA:2 in the last 72 hours   No results found for this basename: PT:2,INR:2,APTT:2 in the last 72 hours   No components found with this basename: ABG:2  Assessment:    1. Significant BNC  2. BPH, s/p TUR-P 01/06/2012  3. Urothelial carcinoma of the bladder, s/p TUR-BT 01/06/2012. High grade, stage Ta.  Plan: Surveillance cystoscopy with balloon dilation of BNC.

## 2012-05-01 NOTE — Interval H&P Note (Signed)
History and Physical Interval Note:  05/01/2012 11:34 AM  Randy [name]  has presented today for surgery, with the diagnosis of BLABBER NECK CONTRACTURE  The various methods of treatment have been discussed with the patient and family. After consideration of risks, benefits and other options for treatment, the patient has consented to  Procedure(s) (LRB) with comments: TRANSURETHRAL RESECTION OF BLADDER NECK (N/A) - 45 MINS TRANSURETHRAL RESECTION OF BLADDER NECK CONTARCTURE  as a surgical intervention .  The patient's history has been reviewed, patient examined, no change in status, stable for surgery.  I have reviewed the patient's chart and labs.  Questions were answered to the patient's satisfaction.     Chelsea Aus

## 2012-05-01 NOTE — Anesthesia Procedure Notes (Signed)
Procedure Name: LMA Insertion Date/Time: 05/01/2012 11:41 AM Performed by: Renella Cunas D Pre-anesthesia Checklist: Patient identified, Emergency Drugs available, Suction available and Patient being monitored Patient Re-evaluated:Patient Re-evaluated prior to inductionOxygen Delivery Method: Circle System Utilized Preoxygenation: Pre-oxygenation with 100% oxygen Intubation Type: IV induction Ventilation: Mask ventilation without difficulty LMA: LMA inserted LMA Size: 4.0 Number of attempts: 1 Airway Equipment and Method: bite block Placement Confirmation: positive ETCO2 Tube secured with: Tape Dental Injury: Teeth and Oropharynx as per pre-operative assessment

## 2012-05-04 ENCOUNTER — Encounter (HOSPITAL_BASED_OUTPATIENT_CLINIC_OR_DEPARTMENT_OTHER): Payer: Self-pay | Admitting: Urology

## 2012-05-04 DIAGNOSIS — N320 Bladder-neck obstruction: Secondary | ICD-10-CM | POA: Diagnosis not present

## 2012-05-04 DIAGNOSIS — N401 Enlarged prostate with lower urinary tract symptoms: Secondary | ICD-10-CM | POA: Diagnosis not present

## 2012-05-04 NOTE — Progress Notes (Signed)
Patient complaining not having good urinary stream, instructed to call Dr. Lenoria Chime office and report.

## 2012-05-05 DIAGNOSIS — R42 Dizziness and giddiness: Secondary | ICD-10-CM | POA: Diagnosis not present

## 2012-05-05 DIAGNOSIS — H903 Sensorineural hearing loss, bilateral: Secondary | ICD-10-CM | POA: Diagnosis not present

## 2012-05-18 DIAGNOSIS — I10 Essential (primary) hypertension: Secondary | ICD-10-CM | POA: Diagnosis not present

## 2012-05-26 ENCOUNTER — Ambulatory Visit (INDEPENDENT_AMBULATORY_CARE_PROVIDER_SITE_OTHER): Payer: Medicare Other | Admitting: Urology

## 2012-05-26 DIAGNOSIS — C679 Malignant neoplasm of bladder, unspecified: Secondary | ICD-10-CM | POA: Diagnosis not present

## 2012-05-26 DIAGNOSIS — Z8551 Personal history of malignant neoplasm of bladder: Secondary | ICD-10-CM | POA: Diagnosis not present

## 2012-06-02 ENCOUNTER — Ambulatory Visit (INDEPENDENT_AMBULATORY_CARE_PROVIDER_SITE_OTHER): Payer: Medicare Other | Admitting: Urology

## 2012-06-02 DIAGNOSIS — D090 Carcinoma in situ of bladder: Secondary | ICD-10-CM | POA: Diagnosis not present

## 2012-06-02 DIAGNOSIS — N400 Enlarged prostate without lower urinary tract symptoms: Secondary | ICD-10-CM

## 2012-06-02 DIAGNOSIS — C679 Malignant neoplasm of bladder, unspecified: Secondary | ICD-10-CM | POA: Diagnosis not present

## 2012-06-12 ENCOUNTER — Ambulatory Visit (INDEPENDENT_AMBULATORY_CARE_PROVIDER_SITE_OTHER): Payer: Medicare Other | Admitting: Urology

## 2012-06-12 DIAGNOSIS — N400 Enlarged prostate without lower urinary tract symptoms: Secondary | ICD-10-CM | POA: Diagnosis not present

## 2012-06-12 DIAGNOSIS — D090 Carcinoma in situ of bladder: Secondary | ICD-10-CM | POA: Diagnosis not present

## 2012-06-19 ENCOUNTER — Ambulatory Visit (INDEPENDENT_AMBULATORY_CARE_PROVIDER_SITE_OTHER): Payer: Medicare Other | Admitting: Urology

## 2012-06-19 DIAGNOSIS — D419 Neoplasm of uncertain behavior of unspecified urinary organ: Secondary | ICD-10-CM | POA: Diagnosis not present

## 2012-06-19 DIAGNOSIS — C679 Malignant neoplasm of bladder, unspecified: Secondary | ICD-10-CM | POA: Diagnosis not present

## 2012-06-26 ENCOUNTER — Ambulatory Visit (INDEPENDENT_AMBULATORY_CARE_PROVIDER_SITE_OTHER): Payer: Medicare Other | Admitting: Urology

## 2012-06-26 DIAGNOSIS — D419 Neoplasm of uncertain behavior of unspecified urinary organ: Secondary | ICD-10-CM | POA: Diagnosis not present

## 2012-06-26 DIAGNOSIS — C679 Malignant neoplasm of bladder, unspecified: Secondary | ICD-10-CM | POA: Diagnosis not present

## 2012-07-07 ENCOUNTER — Ambulatory Visit (INDEPENDENT_AMBULATORY_CARE_PROVIDER_SITE_OTHER): Payer: Medicare Other | Admitting: Urology

## 2012-07-07 DIAGNOSIS — D419 Neoplasm of uncertain behavior of unspecified urinary organ: Secondary | ICD-10-CM | POA: Diagnosis not present

## 2012-07-07 DIAGNOSIS — N400 Enlarged prostate without lower urinary tract symptoms: Secondary | ICD-10-CM | POA: Diagnosis not present

## 2012-07-23 DIAGNOSIS — I491 Atrial premature depolarization: Secondary | ICD-10-CM | POA: Diagnosis not present

## 2012-07-23 DIAGNOSIS — N400 Enlarged prostate without lower urinary tract symptoms: Secondary | ICD-10-CM | POA: Diagnosis not present

## 2012-08-18 ENCOUNTER — Ambulatory Visit (INDEPENDENT_AMBULATORY_CARE_PROVIDER_SITE_OTHER): Payer: Medicare Other | Admitting: Urology

## 2012-08-18 DIAGNOSIS — D090 Carcinoma in situ of bladder: Secondary | ICD-10-CM | POA: Diagnosis not present

## 2012-08-18 DIAGNOSIS — C679 Malignant neoplasm of bladder, unspecified: Secondary | ICD-10-CM | POA: Diagnosis not present

## 2012-08-18 DIAGNOSIS — N320 Bladder-neck obstruction: Secondary | ICD-10-CM

## 2012-08-18 DIAGNOSIS — R82998 Other abnormal findings in urine: Secondary | ICD-10-CM | POA: Diagnosis not present

## 2012-08-25 ENCOUNTER — Ambulatory Visit (INDEPENDENT_AMBULATORY_CARE_PROVIDER_SITE_OTHER): Payer: Medicare Other | Admitting: Urology

## 2012-08-25 DIAGNOSIS — C679 Malignant neoplasm of bladder, unspecified: Secondary | ICD-10-CM | POA: Diagnosis not present

## 2012-08-25 DIAGNOSIS — R3919 Other difficulties with micturition: Secondary | ICD-10-CM

## 2012-09-01 ENCOUNTER — Ambulatory Visit (INDEPENDENT_AMBULATORY_CARE_PROVIDER_SITE_OTHER): Payer: Medicare Other | Admitting: Urology

## 2012-09-01 DIAGNOSIS — C679 Malignant neoplasm of bladder, unspecified: Secondary | ICD-10-CM | POA: Diagnosis not present

## 2012-09-08 ENCOUNTER — Ambulatory Visit (INDEPENDENT_AMBULATORY_CARE_PROVIDER_SITE_OTHER): Payer: Medicare Other | Admitting: Urology

## 2012-09-08 DIAGNOSIS — C679 Malignant neoplasm of bladder, unspecified: Secondary | ICD-10-CM

## 2012-09-14 ENCOUNTER — Encounter: Payer: Self-pay | Admitting: *Deleted

## 2012-09-15 ENCOUNTER — Ambulatory Visit (INDEPENDENT_AMBULATORY_CARE_PROVIDER_SITE_OTHER): Payer: Medicare Other | Admitting: Urology

## 2012-09-15 DIAGNOSIS — D090 Carcinoma in situ of bladder: Secondary | ICD-10-CM | POA: Diagnosis not present

## 2012-09-15 DIAGNOSIS — C679 Malignant neoplasm of bladder, unspecified: Secondary | ICD-10-CM

## 2012-09-21 ENCOUNTER — Encounter: Payer: Self-pay | Admitting: Family Medicine

## 2012-09-21 ENCOUNTER — Ambulatory Visit (INDEPENDENT_AMBULATORY_CARE_PROVIDER_SITE_OTHER): Payer: Medicare Other | Admitting: Family Medicine

## 2012-09-21 VITALS — BP 130/70 | HR 70 | Ht 68.5 in | Wt 162.0 lb

## 2012-09-21 DIAGNOSIS — R7303 Prediabetes: Secondary | ICD-10-CM

## 2012-09-21 DIAGNOSIS — N419 Inflammatory disease of prostate, unspecified: Secondary | ICD-10-CM | POA: Diagnosis not present

## 2012-09-21 DIAGNOSIS — I10 Essential (primary) hypertension: Secondary | ICD-10-CM | POA: Diagnosis not present

## 2012-09-21 DIAGNOSIS — R7309 Other abnormal glucose: Secondary | ICD-10-CM | POA: Diagnosis not present

## 2012-09-21 DIAGNOSIS — Z87898 Personal history of other specified conditions: Secondary | ICD-10-CM | POA: Diagnosis not present

## 2012-09-21 DIAGNOSIS — R030 Elevated blood-pressure reading, without diagnosis of hypertension: Secondary | ICD-10-CM

## 2012-09-21 MED ORDER — CIPROFLOXACIN HCL 500 MG PO TABS: 500.0000 mg | ORAL_TABLET | Freq: Two times a day (BID) | ORAL | Status: DC

## 2012-09-21 NOTE — Assessment & Plan Note (Signed)
bph causing significant urination flow issues. Patient is complaining of moderate dysuria. His urine looks well. He was very tender on prostate exam. We will talk to treat with Cipro 500 mg twice a day for the next 30 days then followup in 4 weeks. He will be following up with his urologist in April.

## 2012-09-21 NOTE — Progress Notes (Signed)
  Subjective:    Patient ID: Randy [name], male    DOB: [birth year], 79 y.o.   MRN: [id number]  Hypertension This is a chronic problem. The problem has been gradually improving since onset. The problem is controlled. Pertinent negatives include no anxiety, blurred vision, chest pain, malaise/fatigue, neck pain or orthopnea. There are no associated agents to hypertension. Risk factors for coronary artery disease include family history. Past treatments include lifestyle changes. The current treatment provides significant improvement. There are no compliance problems.  There is no history of angina, CVA or PVD.  Benign Prostatic Hypertrophy Irritative symptoms include frequency. Associated symptoms include dysuria. Pertinent negatives include no hematuria.      Review of Systems  Constitutional: Negative for malaise/fatigue, activity change and appetite change.  HENT: Negative for ear pain, congestion, rhinorrhea and neck pain.   Eyes: Negative for blurred vision.  Respiratory: Negative for apnea, choking and chest tightness.   Cardiovascular: Negative for chest pain and orthopnea.  Gastrointestinal: Negative.  Negative for abdominal distention.  Genitourinary: Positive for dysuria, frequency and difficulty urinating. Negative for hematuria, flank pain, discharge, penile swelling, enuresis and penile pain.  Neurological: Negative.        Objective:   Physical Exam  Constitutional: He appears well-developed.  HENT:  Head: Normocephalic.  Right Ear: External ear normal.  Left Ear: External ear normal.  Neck: Normal range of motion. Neck supple.  Cardiovascular: Normal rate and normal heart sounds.   Pulmonary/Chest: Effort normal and breath sounds normal.  Abdominal: Soft.  Genitourinary:  Very enlarged prostate. Moderate to significant tenderness. Is being followed by urology. Will treat with antibiotics.  Musculoskeletal: Normal range of motion. He exhibits no edema.           Assessment & Plan:

## 2012-09-21 NOTE — Assessment & Plan Note (Signed)
Blood pressure under good control currently keep current medication going. No changes.

## 2012-09-21 NOTE — Patient Instructions (Signed)
Prostatitis  The prostate gland is about the size and shape of a walnut. It is located just below your bladder. It produces one of the components of semen, which is made up of sperm and the fluids that help nourish and transport it out from the testicles. Prostatitis is redness, soreness, and swelling (inflammation) of the prostate gland.   There are 3 types of prostatitis:   Acute bacterial prostatitis This is the least common type of prostatitis. It starts quickly and usually leads to a bladder infection. It can occur at any age.   Chronic bacterial prostatitis This is a persistent bacterial infection in the prostate.It usually develops from repeated acute bacterial prostatitis or acute bacterial prostatitis that was not properly treated. It can occur in men of any age but is most common in middle-aged men whose prostate has begun to enlarge.   Chronic prostatitis chronic pelvic pain syndrome This is the most common type of prostatitis. It is inflammation of the prostate gland that is not caused by a bacterial infection. The cause is unknown.  CAUSES  The cause of acute and chronic bacterial prostatitis is a bacterial infection. The exact cause of chronic prostatitis and chronic pelvic pain syndrome and asymptomatic inflammatory prostatitis is unknown.   SYMPTOMS   Symptoms can vary depending upon the type of prostatitis that exists. There can also be overlap in symptoms. Possible symptoms for each type of prostatitis are listed below.  Acute bacterial prostatitis   Painful urination.   Fever or chills.   Muscle or joint pains.   Low back pain.   Low abdominal pain.   Inability to empty bladder completely.   Sudden urge to urinate.   Frequent urination.   Difficulty starting urine stream.   Weak urine stream.   Discharge from the urethra.   Dribbling after urination.   Rectal pain.   Pain in the testicles, penis, or tip of the penis.   Pain in the space between the anus and scrotum  (perineum).   Problems with sexual function.   Painful ejaculation.   Bloody semen.  Chronic bacterial prostatitis   The symptoms are similar to those of acute bacterial prostatitis, but they usually are much less severe. Fever, chills, and muscle and joint pain are not associated with chronic bacterial prostatitis.  Chronic prostatitis chronic pelvic pain syndrome   Symptoms typically include a dull ache in the scrotum and the perineum.  DIAGNOSIS   In order to diagnose prostatitis, your caregiver will ask about your symptoms. If acute or chronic bacterial prostatitis is suspected, a urine sample will be taken and tested (urinalysis). This is to see if there is bacteria in your urine. If the urinalysis result is negative for bacteria, your caregiver may use a finger to feel your prostate (digital rectal exam). This exam helps your caregiver determine if your prostate is swollen and tender.  TREATMENT   Treatment for prostatitis depends on the cause. If a bacterial infection is the cause, it can be treated with antibiotic medicine. In cases of chronic bacterial prostatitis, the use of antibiotics for up to 1 month may be necessary. Your caregiver may instruct you to take sitz baths to help relieve pain. A sitz bath is a bath of hot water in which your hips and buttocks are under water.  HOME CARE INSTRUCTIONS    Take all medicines as directed by your caregiver.   Take sitz baths as directed by your caregiver.  SEEK MEDICAL CARE IF:      Your symptoms get worse, not better.   You have a fever.  SEEK IMMEDIATE MEDICAL CARE IF:    You have chills.   You feel nauseous or vomit.   You feel lightheaded or faint.   You are unable to urinate.   You have blood or blood clots in your urine.  Document Released: 06/14/2000 Document Revised: 09/09/2011 Document Reviewed: 05/20/2011  [employer] Patient Information 2013 [employer].

## 2012-09-24 DIAGNOSIS — I10 Essential (primary) hypertension: Secondary | ICD-10-CM | POA: Diagnosis not present

## 2012-09-24 DIAGNOSIS — R7309 Other abnormal glucose: Secondary | ICD-10-CM | POA: Diagnosis not present

## 2012-09-25 ENCOUNTER — Encounter: Payer: Self-pay | Admitting: Family Medicine

## 2012-09-25 LAB — BASIC METABOLIC PANEL
BUN: 16 mg/dL (ref 6–23)
CO2: 29 mEq/L (ref 19–32)
Calcium: 9.1 mg/dL (ref 8.4–10.5)
Chloride: 97 mEq/L (ref 96–112)
Creat: 1.14 mg/dL (ref 0.50–1.35)

## 2012-09-25 LAB — HEMOGLOBIN A1C: Hgb A1c MFr Bld: 6 % — ABNORMAL HIGH (ref <5.7)

## 2012-10-06 ENCOUNTER — Ambulatory Visit (INDEPENDENT_AMBULATORY_CARE_PROVIDER_SITE_OTHER): Payer: Medicare Other | Admitting: Urology

## 2012-10-06 DIAGNOSIS — N400 Enlarged prostate without lower urinary tract symptoms: Secondary | ICD-10-CM | POA: Diagnosis not present

## 2012-10-06 DIAGNOSIS — C679 Malignant neoplasm of bladder, unspecified: Secondary | ICD-10-CM

## 2012-10-06 DIAGNOSIS — N401 Enlarged prostate with lower urinary tract symptoms: Secondary | ICD-10-CM

## 2012-10-06 DIAGNOSIS — N138 Other obstructive and reflux uropathy: Secondary | ICD-10-CM

## 2012-10-11 ENCOUNTER — Other Ambulatory Visit: Payer: Self-pay | Admitting: Family Medicine

## 2012-10-13 ENCOUNTER — Other Ambulatory Visit: Payer: Self-pay | Admitting: Urology

## 2012-10-20 ENCOUNTER — Ambulatory Visit (INDEPENDENT_AMBULATORY_CARE_PROVIDER_SITE_OTHER): Payer: Medicare Other | Admitting: Family Medicine

## 2012-10-20 ENCOUNTER — Encounter: Payer: Self-pay | Admitting: Family Medicine

## 2012-10-20 VITALS — BP 166/90 | Temp 97.5°F | Wt 156.0 lb

## 2012-10-20 DIAGNOSIS — Z125 Encounter for screening for malignant neoplasm of prostate: Secondary | ICD-10-CM | POA: Diagnosis not present

## 2012-10-20 DIAGNOSIS — M542 Cervicalgia: Secondary | ICD-10-CM | POA: Diagnosis not present

## 2012-10-20 DIAGNOSIS — R300 Dysuria: Secondary | ICD-10-CM | POA: Diagnosis not present

## 2012-10-20 LAB — POCT URINALYSIS DIPSTICK: Spec Grav, UA: <=1.005

## 2012-10-20 MED ORDER — AMLODIPINE BESYLATE 5 MG PO TABS: 5.0000 mg | ORAL_TABLET | Freq: Every day | ORAL | Status: DC

## 2012-10-20 MED ORDER — ALPRAZOLAM 0.5 MG PO TABS: ORAL_TABLET | ORAL | Status: DC

## 2012-10-20 NOTE — Patient Instructions (Addendum)
Increase amlodipine (2.5mg  tablet ) may take two each evening, once this is finished you will use 5mg  tablet one daily. Goal for BP is 130/80 range.  Do your xray at hospital  Follow up July or August

## 2012-10-20 NOTE — Progress Notes (Signed)
  Subjective:    Patient ID: Randy [name], male    DOB: 1928/09/03, 79 y.o.   MRN: [id number]  HPI Patient presents followup he has had problems with his blood pressure he is trying to take his medicine as directed. Blood pressure readings low but higher than what they should be. Denies any chest pressure tightness pain he does relate neck pain and discomfort into the back of his head he thinks is related to his cervical spine he is wondering if he ought to see a chiropractor. He denies any numbness or tingling down the arms. No chest tightness pressure pain shortness of breath. No high fevers cough wheezing. No weight loss. States his moods are doing well overall and uses Xanax half a tablet 2-3 times per day denies abusing it.  Past medical history family history social history reviewed. Patient is having an upcoming cystoscope and surgery by urology.   Review of Systems See above    Objective:   Physical Exam Vital signs stable, lungs are clear no crackles heart is regular pulse normal abdomen soft flanks nontender extremities no edema skin warm dry       Assessment & Plan:  HTN-increase amlodipine 5 mg daily followup again in a few months time call us if readings are often. 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 May need to see chiropractor will do x-rays first.

## 2012-10-21 ENCOUNTER — Ambulatory Visit (HOSPITAL_COMMUNITY)
Admission: RE | Admit: 2012-10-21 | Discharge: 2012-10-21 | Disposition: A | Discharge status: Home / Self Care | Payer: Medicare Other | Source: Ambulatory Visit | Attending: Family Medicine | Admitting: Family Medicine

## 2012-10-21 DIAGNOSIS — M542 Cervicalgia: Secondary | ICD-10-CM | POA: Insufficient documentation

## 2012-10-21 DIAGNOSIS — R091 Pleurisy: Secondary | ICD-10-CM | POA: Insufficient documentation

## 2012-10-21 DIAGNOSIS — M47812 Spondylosis without myelopathy or radiculopathy, cervical region: Secondary | ICD-10-CM | POA: Diagnosis not present

## 2012-10-22 ENCOUNTER — Ambulatory Visit: Payer: Medicare Other | Admitting: Family Medicine

## 2012-10-30 ENCOUNTER — Encounter (HOSPITAL_COMMUNITY): Payer: Self-pay | Admitting: Pharmacy Technician

## 2012-11-10 NOTE — Patient Instructions (Addendum)
Randy [name]  11/10/2012   Your procedure is scheduled on:   11/17/2012   Report to [facility] at  1030  AM.  Call this number if you have problems the morning of surgery: [phone]   Remember:   Do not eat food or drink liquids after midnight.   Take these medicines the morning of surgery with A SIP OF WATER:  Xanax,norvasc,flomax   Do not wear jewelry, make-up or nail polish.  Do not wear lotions, powders, or perfumes.  Do not shave 48 hours prior to surgery. Men may shave face and neck.  Do not bring valuables to the hospital.  Contacts, dentures or bridgework may not be worn into surgery.  Leave suitcase in the car. After surgery it may be brought to your room.  For patients admitted to the hospital, checkout time is 11:00 AM the day of discharge.   Patients discharged the day of surgery will not be allowed to drive  home.  Name and phone number of your driver: family  Special Instructions: Shower using CHG 2 nights before surgery and the night before surgery.  If you shower the day of surgery use CHG.  Use special wash - you have one bottle of CHG for all showers.  You should use approximately 1/3 of the bottle for each shower.   Please read over the following fact sheets that you were given: Pain Booklet, Coughing and Deep Breathing, MRSA Information, Surgical Site Infection Prevention, Anesthesia Post-op Instructions and Care and Recovery After Surgery Cystoscopy (Bladder Exam) A cystoscopy is an examination of your urinary bladder with a cystoscope. A cystoscope is an instrument like a small telescope with strong lights and lenses. It is inserted into the bladder through the urethra (the opening into the bladder) and allows your caregiver to examine the inside of your bladder. The procedure causes little discomfort and can be done in a hospital or office. It is a diagnostic procedure to evaluate the inside of your bladder. It may involve x-rays to further evaluate the  ureters or internal aspects of the kidneys. It may aid in the removal of urinary stones  or in taking tissue samples (biopsies) if necessary. The procedure is easier in females because of a shorter urethra. In a male, the procedure must be done through the penis. This often requires more sedation and more time to do the procedure. The procedure usually takes twenty minutes to one half hour for a male and approximately an hour for a male. LET YOUR CAREGIVERS KNOW ABOUT:  Allergies.  Medications taken including herbs, eye drops, over the counter medications, and creams.  Use of steroids (by mouth or creams).  Previous problems with anesthetics or novocaine.  Possibility of pregnancy, if this applies.  History of blood clots (thrombophlebitis).  History of bleeding or blood problems.  Previous surgery, especially where prosthetics have been used like hip or knee replacements, and heart valve replacements.  Other health problems. BEFORE THE PROCEDURE  You should be present 60 minutes prior to your procedure or as directed.  PROCEDURE During the procedure, you will:  Be assisted by your urologist and a nurse.  Lie on a cystoscopy table with your knees elevated and legs apart and covered with a drape. For women this is the same position as when a pap smear is taken.  Have the urethral area or penis washed and covered with sterile towels.  Have an anesthetic (numbing) jelly applied to the urethra.  This is usually all that is required for females but males may also require sedation.  Have the cystoscope inserted through the urethra and into the bladder. Sterile fluid will flow through the cystoscope and into the bladder. This will expand the bladder and provide clear fluid for the urologist to look through and examine the interior of the bladder.  Be allowed to go home once you are doing well, are stable, and awake if you were given a sedative. If given a sedative, have someone give you  a ride home. AFTER THE PROCEDURE  You may have temporary bleeding and burning on urination.  Drink lots of fluids. SEEK IMMEDIATE MEDICAL CARE IF:  There is an increase in blood in the urine or if you are passing clots.  You have difficulty in passing your urine  You develop chills and/or an unexplained oral temperature above 102 F (38.9 C). Your caregiver will discuss your results with you following the procedure. This may be at a later time if you have been sedated. If other testing or biopsies were taken, ask your caregiver how you are to obtain the results. Remember it is your responsibility to get your results. Do not assume everything is normal if you do not hear from your caregiver. Document Released: 06/14/2000 Document Revised: 09/09/2011 Document Reviewed: 12/09/2011 [facility] Patient Information 2013 [place], [place]. Cystoscopy (Bladder Exam) Care After Refer to this sheet in the next few weeks. These discharge instructions provide you with general information on caring for yourself after you leave the hospital. Your caregiver may also give you specific instructions. Your treatment has been planned according to the most current medical practices available, but unavoidable complications sometimes occur. If you have any problems or questions after discharge, please call your caregiver. AFTER THE PROCEDURE   There may be temporary bleeding and burning with urination.  Drink enough water and fluids to keep your urine clear or pale yellow. FINDING OUT THE RESULTS OF YOUR TEST Not all test results are available during your visit. If your test results are not back during the visit, make an appointment with your caregiver to find out the results. Do not assume everything is normal if you have not heard from your caregiver or the medical facility. It is important for you to follow up on all of your test results. SEEK IMMEDIATE MEDICAL CARE IF:   There is an increase in blood in the  urine or you are passing clots.  There is difficulty passing urine.  You develop the chills.  You have an oral temperature above 102 F (38.9 C), not controlled by medicine.  Belly (abdominal) pain develops. Document Released: 01/04/2005 Document Revised: 09/09/2011 Document Reviewed: 12/09/2011 [facility] Patient Information 2013 [place], [place]. PATIENT INSTRUCTIONS POST-ANESTHESIA  IMMEDIATELY FOLLOWING SURGERY:  Do not drive or operate machinery for the first twenty four hours after surgery.  Do not make any important decisions for twenty four hours after surgery or while taking narcotic pain medications or sedatives.  If you develop intractable nausea and vomiting or a severe headache please notify your doctor immediately.  FOLLOW-UP:  Please make an appointment with your surgeon as instructed. You do not need to follow up with anesthesia unless specifically instructed to do so.  WOUND CARE INSTRUCTIONS (if applicable):  Keep a dry clean dressing on the anesthesia/puncture wound site if there is drainage.  Once the wound has quit draining you may leave it open to air.  Generally you should leave the bandage intact for twenty four  hours unless there is drainage.  If the epidural site drains for more than 36-48 hours please call the anesthesia department.  QUESTIONS?:  Please feel free to call your physician or the hospital operator if you have any questions, and they will be happy to assist you.

## 2012-11-11 ENCOUNTER — Encounter (HOSPITAL_COMMUNITY)
Admission: RE | Admit: 2012-11-11 | Discharge: 2012-11-11 | Disposition: A | Discharge status: Home / Self Care | Payer: Medicare Other | Source: Ambulatory Visit | Attending: Urology | Admitting: Urology

## 2012-11-11 ENCOUNTER — Encounter (HOSPITAL_COMMUNITY): Payer: Self-pay

## 2012-11-11 DIAGNOSIS — Z01810 Encounter for preprocedural cardiovascular examination: Secondary | ICD-10-CM | POA: Diagnosis not present

## 2012-11-11 DIAGNOSIS — R972 Elevated prostate specific antigen [PSA]: Secondary | ICD-10-CM | POA: Diagnosis not present

## 2012-11-11 DIAGNOSIS — N3080 Other cystitis without hematuria: Secondary | ICD-10-CM | POA: Diagnosis not present

## 2012-11-11 DIAGNOSIS — N400 Enlarged prostate without lower urinary tract symptoms: Secondary | ICD-10-CM | POA: Diagnosis not present

## 2012-11-11 DIAGNOSIS — Z8551 Personal history of malignant neoplasm of bladder: Secondary | ICD-10-CM | POA: Diagnosis not present

## 2012-11-11 DIAGNOSIS — J449 Chronic obstructive pulmonary disease, unspecified: Secondary | ICD-10-CM | POA: Diagnosis not present

## 2012-11-11 DIAGNOSIS — N419 Inflammatory disease of prostate, unspecified: Secondary | ICD-10-CM | POA: Diagnosis not present

## 2012-11-11 DIAGNOSIS — I10 Essential (primary) hypertension: Secondary | ICD-10-CM | POA: Diagnosis not present

## 2012-11-11 HISTORY — DX: Claustrophobia: F40.240

## 2012-11-11 HISTORY — DX: Essential (primary) hypertension: I10

## 2012-11-11 LAB — BASIC METABOLIC PANEL
BUN: 15 mg/dL (ref 6–23)
CO2: 30 mEq/L (ref 19–32)
Chloride: 101 mEq/L (ref 96–112)
Creatinine, Ser: 1.16 mg/dL (ref 0.50–1.35)

## 2012-11-17 ENCOUNTER — Encounter (HOSPITAL_COMMUNITY)
Admission: RE | Disposition: A | Discharge status: Home / Self Care | Payer: Self-pay | Source: Ambulatory Visit | Attending: Urology

## 2012-11-17 ENCOUNTER — Encounter (HOSPITAL_COMMUNITY): Payer: Self-pay

## 2012-11-17 ENCOUNTER — Ambulatory Visit (HOSPITAL_COMMUNITY): Payer: Medicare Other | Admitting: Anesthesiology

## 2012-11-17 ENCOUNTER — Ambulatory Visit (HOSPITAL_COMMUNITY): Payer: Medicare Other

## 2012-11-17 ENCOUNTER — Ambulatory Visit (HOSPITAL_COMMUNITY)
Admission: RE | Admit: 2012-11-17 | Discharge: 2012-11-17 | Disposition: A | Discharge status: Home / Self Care | Payer: Medicare Other | Source: Ambulatory Visit | Attending: Urology | Admitting: Urology

## 2012-11-17 ENCOUNTER — Encounter (HOSPITAL_COMMUNITY): Payer: Self-pay | Admitting: Anesthesiology

## 2012-11-17 DIAGNOSIS — N3080 Other cystitis without hematuria: Secondary | ICD-10-CM | POA: Insufficient documentation

## 2012-11-17 DIAGNOSIS — Z01810 Encounter for preprocedural cardiovascular examination: Secondary | ICD-10-CM | POA: Insufficient documentation

## 2012-11-17 DIAGNOSIS — N419 Inflammatory disease of prostate, unspecified: Secondary | ICD-10-CM | POA: Diagnosis not present

## 2012-11-17 DIAGNOSIS — D494 Neoplasm of unspecified behavior of bladder: Secondary | ICD-10-CM | POA: Diagnosis not present

## 2012-11-17 DIAGNOSIS — J449 Chronic obstructive pulmonary disease, unspecified: Secondary | ICD-10-CM | POA: Insufficient documentation

## 2012-11-17 DIAGNOSIS — N418 Other inflammatory diseases of prostate: Secondary | ICD-10-CM | POA: Diagnosis not present

## 2012-11-17 DIAGNOSIS — N400 Enlarged prostate without lower urinary tract symptoms: Secondary | ICD-10-CM | POA: Insufficient documentation

## 2012-11-17 DIAGNOSIS — Z8551 Personal history of malignant neoplasm of bladder: Secondary | ICD-10-CM | POA: Insufficient documentation

## 2012-11-17 DIAGNOSIS — I10 Essential (primary) hypertension: Secondary | ICD-10-CM | POA: Insufficient documentation

## 2012-11-17 DIAGNOSIS — J4489 Other specified chronic obstructive pulmonary disease: Secondary | ICD-10-CM | POA: Insufficient documentation

## 2012-11-17 DIAGNOSIS — R972 Elevated prostate specific antigen [PSA]: Secondary | ICD-10-CM | POA: Insufficient documentation

## 2012-11-17 HISTORY — PX: CYSTOSCOPY WITH BIOPSY: SHX5122

## 2012-11-17 HISTORY — PX: PROSTATE BIOPSY: SHX241

## 2012-11-17 SURGERY — BIOPSY, PROSTATE, RECTAL APPROACH, WITH US GUIDANCE
Anesthesia: General | Site: Prostate | Wound class: Clean Contaminated

## 2012-11-17 MED ORDER — STERILE WATER FOR IRRIGATION IR SOLN
Status: DC | PRN
  Administered 2012-11-17: 3000 mL

## 2012-11-17 MED ORDER — GENTAMICIN SULFATE 40 MG/ML IJ SOLN: 160.0000 mg | INTRAVENOUS | Status: DC

## 2012-11-17 MED ORDER — MIDAZOLAM HCL 2 MG/2ML IJ SOLN
INTRAMUSCULAR | Status: AC
  Filled 2012-11-17: qty 2

## 2012-11-17 MED ORDER — FENTANYL CITRATE 0.05 MG/ML IJ SOLN
INTRAMUSCULAR | Status: AC
  Filled 2012-11-17: qty 2

## 2012-11-17 MED ORDER — FENTANYL CITRATE 0.05 MG/ML IJ SOLN: 25.0000 ug | INTRAMUSCULAR | Status: DC | PRN

## 2012-11-17 MED ORDER — PROPOFOL 10 MG/ML IV EMUL
INTRAVENOUS | Status: AC
  Filled 2012-11-17: qty 20

## 2012-11-17 MED ORDER — FENTANYL CITRATE 0.05 MG/ML IJ SOLN
INTRAMUSCULAR | Status: DC | PRN
  Administered 2012-11-17 (×2): 25 ug via INTRAVENOUS

## 2012-11-17 MED ORDER — GENTAMICIN IN SALINE 1.6-0.9 MG/ML-% IV SOLN
INTRAVENOUS | Status: AC
  Filled 2012-11-17: qty 100

## 2012-11-17 MED ORDER — MIDAZOLAM HCL 2 MG/2ML IJ SOLN
1.0000 mg | INTRAMUSCULAR | Status: DC | PRN
  Administered 2012-11-17 (×2): 2 mg via INTRAVENOUS

## 2012-11-17 MED ORDER — LACTATED RINGERS IV SOLN
INTRAVENOUS | Status: DC
  Administered 2012-11-17: 12:00:00 via INTRAVENOUS

## 2012-11-17 MED ORDER — PROPOFOL 10 MG/ML IV BOLUS
INTRAVENOUS | Status: DC | PRN
  Administered 2012-11-17: 100 mg via INTRAVENOUS

## 2012-11-17 MED ORDER — ONDANSETRON HCL 4 MG/2ML IJ SOLN: 4.0000 mg | Freq: Once | INTRAMUSCULAR | Status: DC | PRN

## 2012-11-17 MED ORDER — GENTAMICIN SULFATE 40 MG/ML IJ SOLN
107.5500 mg | INTRAVENOUS | Status: DC | PRN
  Administered 2012-11-17: 107.6 mg via INTRAVENOUS

## 2012-11-17 SURGICAL SUPPLY — 23 items
BAG DRAIN CYSTO-URO STER (DRAIN) ×3 IMPLANT
CLOTH BEACON ORANGE TIMEOUT ST (SAFETY) ×3 IMPLANT
COVER MAYO STAND XLG (DRAPE) ×3 IMPLANT
ELECT REM PT RETURN 9FT ADLT (ELECTROSURGICAL) ×3
ELECTRODE REM PT RTRN 9FT ADLT (ELECTROSURGICAL) ×2 IMPLANT
FORMALIN 10 PREFILL 60ML (MISCELLANEOUS) ×7 IMPLANT
GLOVE BIO SURGEON STRL SZ8 (GLOVE) ×3 IMPLANT
GOWN PREVENTION PLUS LG XLONG (DISPOSABLE) ×3 IMPLANT
GOWN STRL REIN XL XLG (GOWN DISPOSABLE) ×6 IMPLANT
KIT ROOM TURNOVER AP CYSTO (KITS) ×3 IMPLANT
KIT ROOM TURNOVER APOR (KITS) ×3 IMPLANT
MANIFOLD NEPTUNE II (INSTRUMENTS) ×3 IMPLANT
NDL BIOPSY 18X20 MAGNUM (NEEDLE) ×2 IMPLANT
NDL SAFETY ECLIPSE 18X1.5 (NEEDLE) IMPLANT
NEEDLE BIOPSY 18X20 MAGNUM (NEEDLE) ×3 IMPLANT
NEEDLE HYPO 18GX1.5 SHARP (NEEDLE) ×3
NEEDLE HYPO 22GX1.5 SAFETY (NEEDLE) IMPLANT
NS IRRIG 500ML POUR BTL (IV SOLUTION) IMPLANT
PACK CYSTO (CUSTOM PROCEDURE TRAY) ×3 IMPLANT
PAD TELFA 3X4 1S STER (GAUZE/BANDAGES/DRESSINGS) ×4 IMPLANT
SYR 20CC LL (SYRINGE) IMPLANT
TOWEL OR 17X24 6PK STRL BLUE (TOWEL DISPOSABLE) ×3 IMPLANT
WATER STERILE IRR 3000ML UROMA (IV SOLUTION) ×3 IMPLANT

## 2012-11-17 NOTE — Anesthesia Postprocedure Evaluation (Signed)
  Anesthesia Post-op Note  Patient: Randy [name]  Procedure(s) Performed: Procedure(s) with comments: BIOPSY TRANSRECTAL ULTRASONIC PROSTATE (TUBP) (N/A) - 45 MINS 1. TRANSRECTAL ULTRASOUND OF PROSTATE WITH BIOPSY -will need ultrasound tech 2.CYSTOSCOPY, BLADDER BIOPSY - need resectoscope- not Gyrus [id number] Medicare -[id number] A BCBS - [id number] CYSTOSCOPY WITH BIOPSY (N/A)  Patient Location: PACU  Anesthesia Type:General  Level of Consciousness: awake, alert , oriented and patient cooperative  Airway and Oxygen Therapy: Patient Spontanous Breathing  Post-op Pain: none  Post-op Assessment: Post-op Vital signs reviewed, Patient's Cardiovascular Status Stable, Respiratory Function Stable, Patent Airway, No signs of Nausea or vomiting and Pain level controlled  Post-op Vital Signs: Reviewed and stable  Complications: No apparent anesthesia complications

## 2012-11-17 NOTE — Op Note (Signed)
Preoperative diagnosis: History of carcinoma in situ of the bladder, abnormal prostate exam  Postoperative diagnosis: Same   Procedure: Transrectal ultrasound and biopsy of the prostate, cystoscopy, bladder biopsy    Surgeon: Bertram Millard. Martrice Apt, M.D.   Anesthesia: Gen.   Complications: None  Specimen(s): Prostate core biopsies, bladder biopsies, to pathology  Drain(s): None  Indications: 79 year-old male with a history of carcinoma in situ of the bladder, status post BCG therapy. Recent followup in the office in April, 2014 revealed the patient to have a normal-appearing bladder but with atypical cytology. Additionally, prostate exam was significantly different from his initial evaluation in 2013, with slight nodularity but significant prostatic asymmetry. PSA went from 1.5 in 2013-4.55 this year. He presents at this time for ultrasound and biopsy of the prostate as well as anesthetic cystoscopy and bladder biopsy. Risks and complications of the procedure have been discussed with the patient in advance. He understands these and desires to proceed.    Technique and findings: The patient was properly identified in the holding area. He received preoperative IV antibiotics. He was taken to the operating room where general anesthesia was administered with the LMA. He was then placed in the left lateral decubitus position. Timeout was then performed.  A transrectal ultrasound transducer was advanced into his rectum. Serial scans of the prostate were taken. Prostatic line was 46 cc. There was some asymmetry of the prostate. No specific hypoechoic areas were noted. 12 biopsies were taken with the biopsy done according to my routine systematic biopsy plan. 6 biopsies were from the right, 6 from the left prostate. These were sent to permanent pathology.  Following ultrasound and biopsy, the patient was moved to the dorsolithotomy position. Genitalia were prepped and draped. A 22 French panendoscope  was used to advance through the urethra to the bladder. There were no urethral lesions or strictures. Prostate was nonobstructive. Post surgical changes to the bladder neck were noted, with no significant bladder neck contracture. The bladder was then entered and inspected circumferentially. There were no tumors, trabeculations or foreign bodies. Ureteral orifices were normal in configuration and location. There was mild erythema of the posterior/superior bladder. 3 separate biopsies were taken in this area and labeled "bladder biopsies", using the cold cup biopsy forceps. Biopsy sites were cauterized with the Bugbee electrode. Following systematic survey of the bladder and no other lesions noted, the bladder was drained and the scope removed. No catheter was left in.  The patient was then awakened. He was taken to the PACU in stable condition. He tolerated the procedure well.

## 2012-11-17 NOTE — Anesthesia Preprocedure Evaluation (Signed)
Anesthesia Evaluation  Patient identified by MRN, date of birth, ID band Patient awake    Reviewed: Allergy & Precautions, H&P , NPO status , Patient's Chart, lab work & pertinent test results  Airway Mallampati: II TM Distance: <3 FB Neck ROM: Full    Dental no notable dental hx.    Pulmonary neg pulmonary ROS, shortness of breath, COPD breath sounds clear to auscultation  Pulmonary exam normal       Cardiovascular hypertension, negative cardio ROS  Rhythm:Regular Rate:Normal     Neuro/Psych Anxiety negative neurological ROS     GI/Hepatic Neg liver ROS, GERD-  Medicated and Controlled,  Endo/Other  negative endocrine ROS  Renal/GU negative Renal ROS  negative genitourinary   Musculoskeletal negative musculoskeletal ROS (+)   Abdominal   Peds negative pediatric ROS (+)  Hematology negative hematology ROS (+)   Anesthesia Other Findings   Reproductive/Obstetrics negative OB ROS                           Anesthesia Physical Anesthesia Plan  ASA: II  Anesthesia Plan: General   Post-op Pain Management:    Induction: Intravenous  Airway Management Planned: LMA  Additional Equipment:   Intra-op Plan:   Post-operative Plan:   Informed Consent: I have reviewed the patients History and Physical, chart, labs and discussed the procedure including the risks, benefits and alternatives for the proposed anesthesia with the patient or authorized representative who has indicated his/her understanding and acceptance.   Dental advisory given  Plan Discussed with: CRNA and Surgeon  Anesthesia Plan Comments: (4 LMA used previously)        Anesthesia Quick Evaluation

## 2012-11-17 NOTE — Transfer of Care (Signed)
Immediate Anesthesia Transfer of Care Note  Patient: Randy [name]  Procedure(s) Performed: Procedure(s) with comments: BIOPSY TRANSRECTAL ULTRASONIC PROSTATE (TUBP) (N/A) - 45 MINS 1. TRANSRECTAL ULTRASOUND OF PROSTATE WITH BIOPSY -will need ultrasound tech 2.CYSTOSCOPY, BLADDER BIOPSY - need resectoscope- not Gyrus [id number] Medicare -[id number] A BCBS - [id number] CYSTOSCOPY WITH BIOPSY (N/A)  Patient Location: PACU  Anesthesia Type:General  Level of Consciousness: awake, alert  and patient cooperative  Airway & Oxygen Therapy: Patient Spontanous Breathing and Patient connected to face mask oxygen  Post-op Assessment: Report given to PACU RN, Post -op Vital signs reviewed and stable and Patient moving all extremities  Post vital signs: Reviewed and stable  Complications: No apparent anesthesia complications

## 2012-11-17 NOTE — H&P (Signed)
Urology History and Physical Exam  CC: History of bladder cancer, prostate abnormality   HPI: 79 year old male presents for anesthetic cystoscopy and TRUS/Bx of the prostate. His history is as follows:  He underwent combined TURBT and TURP on 01/06/2012.  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 There was no evidence of invasion regarding the urothelial carcinoma. Muscle was present in the specimen.  Recently, he has had a slow stream, feeling of incomplete emptying and frequency. He was found to have a bladder neck contracture this was treated with anesthetic cystoscopy, bladder neck incision/dilation and cystoscopy on 05/01/2012. At that time, he was found to have a urothelial abnormality of the bladder. Biopsy revealed carcinoma in situ.  He completed BCG induction on 07/07/2012.   Cystoscopy, cytology/fish on 08/18/2012 were negative.  He completed his first BCG maintenance  On 09/15/2012.  Followup  On 10/06/2012 revealed mild recurrence of his BNC as well as an asymmetric prostate exam and a PSA up to 4.49 from approximately 1.4 a year prior. Cytology/FISH was negative.   PMH: Past Medical History  Diagnosis Date  . History of BPH   . DJD (degenerative joint disease)     cervical  . GERD (gastroesophageal reflux disease)     takes prilosec intermittently  . Anxiety     claustraphobia  . Complication of anesthesia     claustraphobia  . BNC (bladder neck contracture)   . Dysuria OCCASIONAL  . Urinary hesitancy   . Cervical spondylosis   . Hiatal hernia   . COPD (chronic obstructive pulmonary disease)   . Impaired fasting glucose   . Claustrophobia   . Hypertension     PSH: Past Surgical History  Procedure Laterality Date  . Retinal detachment surgery  1988  . Cardiovascular stress test  11-08-2009  DR ROTHBART    LOW RISK MYOVIEW/ NO ISCHEMIA/ LVEF 64%  . Transurethral resection of bladder tumor   01/06/2012    Procedure: TRANSURETHRAL RESECTION OF BLADDER TUMOR (TURBT);  Surgeon: Marcine Matar, MD;  Location: [facility];  Service: Urology;  Laterality: N/A;  . Transurethral resection of prostate    . Laparoscopic repair large hiatal hernia w/ mesh and nissen fundoplication  03-27-2010  . Cholecystectomy  1984    OPEN  . Cataract extraction w/ intraocular lens  implant, bilateral    . Transurethral resection of bladder neck  05/01/2012    Procedure: TRANSURETHRAL RESECTION OF BLADDER NECK;  Surgeon: Marcine Matar, MD;  Location: [facility];  Service: Urology;  Laterality: N/A;  45 MINS TRANSURETHRAL RESECTION OF BLADDER NECK CONTARCTURE   . Collapsed lung  1965  . Transurethral resection of prostate      Allergies: Allergies  Allergen Reactions  . Cutivate (Fluticasone) Other (See Comments)    Patient is unaware of this allergy.     Medications: No prescriptions prior to admission     Social History: History   Social History  . Marital Status: Married    Spouse Name: N/A    Number of Children: N/A  . Years of Education: N/A   Occupational History  . Retired    Social History Main Topics  . Smoking status: Former Smoker -- 40 years    Quit date: 12/26/1986  . Smokeless tobacco: Never Used  . Alcohol Use: No  . Drug Use: No  . Sexually Active: Yes    Birth Control/ Protection: None   Other Topics  Concern  . Not on file   Social History Narrative   Exercises regularly    Family History: Family History  Problem Relation Age of Onset  . Stroke Mother   . Kidney failure Father   . Heart disease Brother     Review of Systems: Positive: N/A Negative:  A further 10 point review of systems was negative except what is listed in the HPI.  Physical Exam: @VITALS2 @ General: No acute distress.  Awake. Head:  Normocephalic.  Atraumatic. ENT:  EOMI.  Mucous membranes moist Neck:  Supple.  No lymphadenopathy. CV:  S1  present. S2 present. Regular rate. Pulmonary: Equal effort bilaterally.  Clear to auscultation bilaterally. Abdomen: Soft, flat.  Non tender to palpation. Skin:  Normal turgor.  No visible rash. Extremity: No gross deformity of bilateral upper extremities.  No gross deformity of    bilateral lower extremities. Neurologic: Alert. Appropriate mood. Rectal:  There is significant asymmetry of the prostate, with the right lobe being larger than the left. There is no significant nodularity, however.  Penis:    No lesions. Urethra:   Orthotopic meatus. Scrotum: No lesions.  No ecchymosis.  No erythema. Testicles: Descended bilaterally.  No masses bilaterally. Epididymis: Palpable bilaterally. Non Tender to palpation.  Studies:  No results found for this basename: HGB, WBC, PLT,  in the last 72 hours  No results found for this basename: NA, K, CL, CO2, BUN, CREATININE, CALCIUM, MAGNESIUM, GFRNONAA, GFRAA,  in the last 72 hours   No results found for this basename: PT, INR, APTT,  in the last 72 hours   No components found with this basename: ABG,     Assessment:     1. Bladder neck contracture following TURP, status post treatment on 05/01/2012.   2. BPH, status post TURP  3. Carcinoma in situ bladder, he completed his first BCG induction treatment on 07/07/2012 , and completed his first BCG maintenance on 09/15/2012-he has no direct evidence of recurrence  4. Asymmetric prostate exam. This may be due to his prior TURP, neoplasm or BCG granuloma   Plan: Cystoscopy, probable bladder biopsy/TUR-BNC/TRUS/Bx of prostate

## 2012-11-18 ENCOUNTER — Encounter (HOSPITAL_COMMUNITY): Payer: Self-pay | Admitting: Urology

## 2012-12-15 ENCOUNTER — Encounter: Payer: Self-pay | Admitting: Family Medicine

## 2012-12-15 ENCOUNTER — Ambulatory Visit (INDEPENDENT_AMBULATORY_CARE_PROVIDER_SITE_OTHER): Payer: Medicare Other | Admitting: Family Medicine

## 2012-12-15 VITALS — BP 114/70 | Temp 97.7°F | Wt 156.0 lb

## 2012-12-15 DIAGNOSIS — J019 Acute sinusitis, unspecified: Secondary | ICD-10-CM | POA: Diagnosis not present

## 2012-12-15 DIAGNOSIS — R300 Dysuria: Secondary | ICD-10-CM

## 2012-12-15 LAB — POCT URINALYSIS DIPSTICK: pH, UA: 6

## 2012-12-15 MED ORDER — FLUTICASONE PROPIONATE 50 MCG/ACT NA SUSP: 2.0000 | Freq: Every day | NASAL | Status: DC

## 2012-12-15 MED ORDER — AMOXICILLIN 500 MG PO TABS: ORAL_TABLET | ORAL | Status: AC

## 2012-12-15 NOTE — Progress Notes (Signed)
  Subjective:    Patient ID: Randy [name], male    DOB: [birth year], 79 y.o.   MRN: [id number]  Cough This is a new problem. The current episode started 1 to 4 weeks ago. The cough is non-productive. Associated symptoms include nasal congestion and wheezing. He has tried OTC cough suppressant for the symptoms. His past medical history is significant for COPD.   Patient having burning with urination. He has an appointment with urology next week on Tuesday. Patient denies any hematuria no high fevers. Under a lot of stress with his wife having some pretty severe COPD sickness nearing the end of life.  Review of Systems  Respiratory: Positive for cough and wheezing.        Objective:   Physical Exam Blood pressure noted neck no masses lungs are clear no crackles heart is regular abdomen is soft skin warm dry neurologic grossly normal neck no masses either. Eardrums normal urinalysis looks negative       Assessment & Plan:  Dysuria-culture taken await results Mild URI Flonase as directed for allergies does not tolerate oral antihistamines because of prostate issue Possible acute sinusitis-amoxicillin 3 times a day 10 days BPH followup her urologist next

## 2012-12-22 ENCOUNTER — Ambulatory Visit (INDEPENDENT_AMBULATORY_CARE_PROVIDER_SITE_OTHER): Payer: Medicare Other | Admitting: Urology

## 2012-12-22 DIAGNOSIS — N320 Bladder-neck obstruction: Secondary | ICD-10-CM

## 2012-12-22 DIAGNOSIS — D090 Carcinoma in situ of bladder: Secondary | ICD-10-CM

## 2012-12-22 DIAGNOSIS — C679 Malignant neoplasm of bladder, unspecified: Secondary | ICD-10-CM | POA: Diagnosis not present

## 2013-01-04 ENCOUNTER — Other Ambulatory Visit: Payer: Self-pay | Admitting: Family Medicine

## 2013-02-09 ENCOUNTER — Ambulatory Visit (INDEPENDENT_AMBULATORY_CARE_PROVIDER_SITE_OTHER): Payer: Medicare Other | Admitting: Family Medicine

## 2013-02-09 ENCOUNTER — Encounter: Payer: Self-pay | Admitting: Family Medicine

## 2013-02-09 VITALS — BP 136/82 | Ht 69.0 in | Wt 161.6 lb

## 2013-02-09 DIAGNOSIS — I10 Essential (primary) hypertension: Secondary | ICD-10-CM | POA: Diagnosis not present

## 2013-02-09 MED ORDER — ALPRAZOLAM 0.5 MG PO TABS: 0.2500 mg | ORAL_TABLET | Freq: Two times a day (BID) | ORAL | Status: DC

## 2013-02-09 NOTE — Progress Notes (Signed)
  Subjective:    Patient ID: Randy [name], male    DOB: [birth year], 79 y.o.   MRN: [id number]  Hypertension This is a chronic problem. The current episode started more than 1 year ago.      Review of Systems     Objective:   Physical Exam        Assessment & Plan:

## 2013-02-09 NOTE — Progress Notes (Signed)
  Subjective:    Patient ID: Randy [name], male    DOB: [birth year], 79 y.o.   MRN: [id number]  Hypertension This is a chronic problem. The current episode started more than 1 year ago.   Patient is a good job taking his medicine he checks his blood pressure on regular basis it is not been exceptionally high. It's been under good control he denies any chest tightness pressure pain shortness breath or swelling in the legs.   Patient states he is also having off and on again problems with arthritis. He relates is mainly in the shoulders and elbows occasionally in the hands not in the lower back or legs Uses tylenol 500 mg, takes 4 to 6  Per day  Review of Systems See above    Objective:   Physical Exam  Lungs are clear no crackles heart is regular pulse normal subjective discomfort in the shoulders and elbows. Extremities no edema      Assessment & Plan:  #1 HTN good control continue current measures #2 arthralgias Tylenol as needed #3 wellness exam in October

## 2013-03-02 ENCOUNTER — Ambulatory Visit (INDEPENDENT_AMBULATORY_CARE_PROVIDER_SITE_OTHER): Payer: Medicare Other | Admitting: Urology

## 2013-03-02 DIAGNOSIS — C679 Malignant neoplasm of bladder, unspecified: Secondary | ICD-10-CM

## 2013-03-09 ENCOUNTER — Ambulatory Visit (INDEPENDENT_AMBULATORY_CARE_PROVIDER_SITE_OTHER): Payer: Medicare Other | Admitting: Urology

## 2013-03-09 DIAGNOSIS — N401 Enlarged prostate with lower urinary tract symptoms: Secondary | ICD-10-CM | POA: Diagnosis not present

## 2013-03-09 DIAGNOSIS — C679 Malignant neoplasm of bladder, unspecified: Secondary | ICD-10-CM

## 2013-03-16 ENCOUNTER — Ambulatory Visit (INDEPENDENT_AMBULATORY_CARE_PROVIDER_SITE_OTHER): Payer: Medicare Other | Admitting: Urology

## 2013-03-16 DIAGNOSIS — C679 Malignant neoplasm of bladder, unspecified: Secondary | ICD-10-CM

## 2013-04-06 ENCOUNTER — Encounter: Payer: Self-pay | Admitting: Family Medicine

## 2013-04-06 ENCOUNTER — Telehealth (INDEPENDENT_AMBULATORY_CARE_PROVIDER_SITE_OTHER): Payer: Medicare Other | Admitting: Family Medicine

## 2013-04-06 VITALS — BP 118/70 | Ht 68.5 in | Wt 156.5 lb

## 2013-04-06 DIAGNOSIS — I10 Essential (primary) hypertension: Secondary | ICD-10-CM | POA: Diagnosis not present

## 2013-04-06 DIAGNOSIS — Z0000 Encounter for general adult medical examination without abnormal findings: Secondary | ICD-10-CM

## 2013-04-06 MED ORDER — TRIAMCINOLONE ACETONIDE 0.1 % EX CREA: TOPICAL_CREAM | Freq: Two times a day (BID) | CUTANEOUS | Status: DC

## 2013-04-06 NOTE — Patient Instructions (Signed)
Thank you for coming for your annual wellness visit.  Please follow through on any advice that was given to you by today's visit. Remember to maintain compliance with your medications as discussed today.  Also remember it is important to eat a healthy diet and to stay physically active on a daily basis.  Please follow through with any testing or recommended followup office visits as was discussed today. You are due the following test coming up:  ALL- Colonoscopy-to do heme cards this year/ please doi and return           Vaccines-shingles vaccine prescription given    Males- Prostate Exam_yearly  NEXT EXAM IS IN 6 MONTHS     Finally remembered that the annual wellness visit does not take the place of regularly scheduled office visits  chronic health problems such as hypertension/diabetes/cholesterol visits.

## 2013-04-06 NOTE — Progress Notes (Signed)
  Subjective:    Patient ID: Randy [name], male    DOB: 1928/09/04, 79 y.o.   MRN: [id number]  HPI AWV- Annual Wellness Visit  The patient was seen for their annual wellness visit. The patient's past medical history, surgical history, and family history were reviewed. Pertinent vaccines were reviewed ( tetanus, pneumonia, shingles, flu) The patient's medication list was reviewed and updated. The height and weight were entered. The patient's current BMI is:  Cognitive screening was completed. Outcome of Mini - Cog: failed, MMSE completed with a score of 25  Falls within the past 6 months: No falls  Current tobacco usage: Former Smoker, quit 20 years ago (All patients who use tobacco were given written and verbal information on quitting)  Recent listing of emergency department/hospitalizations over the past year were reviewed.  current specialist the patient sees on a regular basis: [facility], Dr.Dalhdstedt  Medicare annual wellness visit patient questionnaire was reviewed.  A written screening schedule for the patient for the next 5-10 years was given. Appropriate discussion of followup regarding next visit was discussed.      Review of Systems  Constitutional: Negative for fever, activity change and appetite change.  HENT: Negative for congestion, rhinorrhea and neck pain.   Eyes: Negative for discharge.  Respiratory: Negative for cough and wheezing.   Cardiovascular: Negative for chest pain.  Gastrointestinal: Negative for vomiting, abdominal pain and blood in stool.  Genitourinary: Negative for frequency and difficulty urinating.  Skin: Negative for rash.  Allergic/Immunologic: Negative for environmental allergies and food allergies.  Neurological: Negative for weakness and headaches.  Psychiatric/Behavioral: Negative for agitation.       Objective:   Physical Exam  Constitutional: He appears well-developed and well-nourished.  HENT:  Head: Normocephalic and  atraumatic.  Right Ear: External ear normal.  Left Ear: External ear normal.  Nose: Nose normal.  Mouth/Throat: Oropharynx is clear and moist.  Eyes: EOM are normal. Pupils are equal, round, and reactive to light.  Neck: Normal range of motion. Neck supple. No thyromegaly present.  Cardiovascular: Normal rate, regular rhythm and normal heart sounds.   No murmur heard. Pulmonary/Chest: Effort normal and breath sounds normal. No respiratory distress. He has no wheezes.  Abdominal: Soft. Bowel sounds are normal. He exhibits no distension and no mass. There is no tenderness.  Musculoskeletal: Normal range of motion. He exhibits no edema.  Lymphadenopathy:    He has no cervical adenopathy.  Neurological: He is alert. He exhibits normal muscle tone.  Skin: Skin is warm and dry. No erythema.  Psychiatric: He has a normal mood and affect. His behavior is normal. Judgment normal.          Assessment & Plan:  Shingles vaccine-prescription given  Body aches at night- tylenol, no greater than 6 per day-probably just mild arthralgias no sign of any serious underlying problem patient was told Tylenol would be fine not to exceed 6 in a day- Body itching- day in and day out-due to dry skin use Kenalog cream and lotion also humidifier  Oct 28th seeing urologist-followup regarding bladder tumors  Heme cards times 3 to do-patient opts not to do colonoscopy. Hypertension decent control Overall health is fairly good we discussed dietary measures regular activity. No dementia yeah currently is safe to drive.

## 2013-04-16 ENCOUNTER — Other Ambulatory Visit (INDEPENDENT_AMBULATORY_CARE_PROVIDER_SITE_OTHER): Payer: Medicare Other | Admitting: *Deleted

## 2013-04-16 DIAGNOSIS — Z0000 Encounter for general adult medical examination without abnormal findings: Secondary | ICD-10-CM

## 2013-04-16 DIAGNOSIS — Z1211 Encounter for screening for malignant neoplasm of colon: Secondary | ICD-10-CM | POA: Diagnosis not present

## 2013-04-16 LAB — POC HEMOCCULT BLD/STL (HOME/3-CARD/SCREEN): Card #3 Fecal Occult Blood, POC: NEGATIVE

## 2013-04-19 ENCOUNTER — Encounter: Payer: Self-pay | Admitting: Family Medicine

## 2013-04-19 DIAGNOSIS — I10 Essential (primary) hypertension: Secondary | ICD-10-CM | POA: Diagnosis not present

## 2013-04-19 LAB — LIPID PANEL
Cholesterol: 175 mg/dL (ref 0–200)
HDL: 58 mg/dL (ref >39)
Total CHOL/HDL Ratio: 3 Ratio
Triglycerides: 86 mg/dL (ref <150)
VLDL: 17 mg/dL (ref 0–40)

## 2013-04-27 ENCOUNTER — Ambulatory Visit (INDEPENDENT_AMBULATORY_CARE_PROVIDER_SITE_OTHER): Payer: Medicare Other | Admitting: Urology

## 2013-04-27 DIAGNOSIS — C679 Malignant neoplasm of bladder, unspecified: Secondary | ICD-10-CM | POA: Diagnosis not present

## 2013-04-27 DIAGNOSIS — D090 Carcinoma in situ of bladder: Secondary | ICD-10-CM | POA: Diagnosis not present

## 2013-04-27 DIAGNOSIS — N401 Enlarged prostate with lower urinary tract symptoms: Secondary | ICD-10-CM | POA: Diagnosis not present

## 2013-04-27 DIAGNOSIS — N320 Bladder-neck obstruction: Secondary | ICD-10-CM | POA: Diagnosis not present

## 2013-05-06 ENCOUNTER — Other Ambulatory Visit: Payer: Self-pay

## 2013-05-31 ENCOUNTER — Other Ambulatory Visit: Payer: Self-pay | Admitting: Family Medicine

## 2013-06-04 ENCOUNTER — Ambulatory Visit (INDEPENDENT_AMBULATORY_CARE_PROVIDER_SITE_OTHER): Payer: Medicare Other | Admitting: Family Medicine

## 2013-06-04 ENCOUNTER — Encounter: Payer: Self-pay | Admitting: Family Medicine

## 2013-06-04 VITALS — BP 122/82 | Ht 69.0 in | Wt 165.1 lb

## 2013-06-04 DIAGNOSIS — M549 Dorsalgia, unspecified: Secondary | ICD-10-CM

## 2013-06-04 LAB — POCT URINALYSIS DIPSTICK: pH, UA: 6

## 2013-06-04 MED ORDER — TRAMADOL HCL 50 MG PO TABS: 50.0000 mg | ORAL_TABLET | Freq: Four times a day (QID) | ORAL | Status: DC | PRN

## 2013-06-04 MED ORDER — VALACYCLOVIR HCL 1 G PO TABS: 1000.0000 mg | ORAL_TABLET | Freq: Three times a day (TID) | ORAL | Status: DC

## 2013-06-04 NOTE — Progress Notes (Signed)
   Subjective:    Patient ID: Randy [name], male    DOB: 1928-10-11, 79 y.o.   MRN: [id number]  Back Pain This is a new problem. The current episode started in the past 7 days. The problem occurs constantly. The problem is unchanged. The pain is present in the lumbar spine. The quality of the pain is described as aching. The pain does not radiate. The pain is at a severity of 8/10. The pain is moderate. The pain is worse during the night. Stiffness is present all day. He has tried nothing for the symptoms. The treatment provided no relief.   Patient relates that he started having some discomfort in the left flank region radiated to his side. No abdominal symptoms with it no nausea or vomiting. No dysuria hematuria or hematochezia. No fevers. No wheezing or difficulty breathing.   Review of Systems  Musculoskeletal: Positive for back pain.       Objective:   Physical Exam  Vitals reviewed. Constitutional: He appears well-developed and well-nourished.  Cardiovascular: Normal rate, regular rhythm and normal heart sounds.   Pulmonary/Chest: Effort normal and breath sounds normal. No respiratory distress. He has no wheezes.  Abdominal: Soft. Bowel sounds are normal. He exhibits no distension. There is no tenderness. There is no guarding.          Assessment & Plan:  Possible shingles-related pain but no rash seen currently. I would recommend that this patient use tramadol for the discomfort followup in 7-10 days he was given a prescription for Valtrex if he breaks out with shingles. No need for any CAT scans or lab work currently. Patient not toxic today warning signs to discuss if high fevers severe pain or worse followup.

## 2013-06-14 ENCOUNTER — Ambulatory Visit (INDEPENDENT_AMBULATORY_CARE_PROVIDER_SITE_OTHER): Payer: Medicare Other | Admitting: Family Medicine

## 2013-06-14 ENCOUNTER — Encounter: Payer: Self-pay | Admitting: Family Medicine

## 2013-06-14 VITALS — BP 126/82 | Ht 68.5 in | Wt 157.6 lb

## 2013-06-14 DIAGNOSIS — M549 Dorsalgia, unspecified: Secondary | ICD-10-CM | POA: Diagnosis not present

## 2013-06-14 DIAGNOSIS — R109 Unspecified abdominal pain: Secondary | ICD-10-CM

## 2013-06-14 LAB — CBC WITH DIFFERENTIAL/PLATELET
Basophils Absolute: 0 10*3/uL (ref 0.0–0.1)
Basophils Relative: 0 % (ref 0–1)
Hemoglobin: 16 g/dL (ref 13.0–17.0)
MCHC: 34.6 g/dL (ref 30.0–36.0)
Monocytes Relative: 12 % (ref 3–12)
Neutro Abs: 3.8 10*3/uL (ref 1.7–7.7)
Neutrophils Relative %: 67 % (ref 43–77)

## 2013-06-14 LAB — HEPATIC FUNCTION PANEL
AST: 14 U/L (ref 0–37)
Alkaline Phosphatase: 58 U/L (ref 39–117)
Bilirubin, Direct: 0.1 mg/dL (ref 0.0–0.3)
Total Bilirubin: 0.5 mg/dL (ref 0.3–1.2)

## 2013-06-14 LAB — BASIC METABOLIC PANEL
Glucose, Bld: 103 mg/dL — ABNORMAL HIGH (ref 70–99)
Potassium: 4.8 mEq/L (ref 3.5–5.3)
Sodium: 138 mEq/L (ref 135–145)

## 2013-06-14 MED ORDER — TRAMADOL HCL 50 MG PO TABS: 50.0000 mg | ORAL_TABLET | Freq: Four times a day (QID) | ORAL | Status: DC | PRN

## 2013-06-14 NOTE — Progress Notes (Signed)
   Subjective:    Patient ID: Randy [name], male    DOB: 04-Sep-1928, 79 y.o.   MRN: [id number]  HPI Patient is here today for a f/u on lower back pain. He was seen here for the same symptoms on 12/5. He said the pain comes and goes. It is tolerable. The pain is not better, nor worst. He does not have a rash where the pain is. He has been taking the Valacyclovir.   PMH GERD/BPH Does not smoke  Review of Systems    no vomiting no rectal bleeding no high fever chills or dysuria. No pain with movement. Pain just comes and goes not quite as bad as what it was. Objective:   Physical Exam  Lungs are clear heart is regular abdomen soft no guarding rebound or tenderness. Subjective discomfort in the left lower back and left mid axilla Area of the abdomen      Assessment & Plan:  Lower back pain along with left side abdominal discomfort-tramadol as directed. If vomiting rectal bleeding or other progressive troubles followup immediately otherwise followup 3-4 weeks. We will check lab work orders were given. In addition to this patient will need followup office visit in several weeks may need scan.

## 2013-06-15 ENCOUNTER — Encounter: Payer: Self-pay | Admitting: Family Medicine

## 2013-06-28 ENCOUNTER — Other Ambulatory Visit: Payer: Self-pay | Admitting: Family Medicine

## 2013-07-12 ENCOUNTER — Ambulatory Visit: Payer: Medicare Other | Admitting: Family Medicine

## 2013-07-27 ENCOUNTER — Ambulatory Visit (INDEPENDENT_AMBULATORY_CARE_PROVIDER_SITE_OTHER): Payer: Medicare Other | Admitting: Urology

## 2013-07-27 DIAGNOSIS — N402 Nodular prostate without lower urinary tract symptoms: Secondary | ICD-10-CM | POA: Diagnosis not present

## 2013-07-27 DIAGNOSIS — C679 Malignant neoplasm of bladder, unspecified: Secondary | ICD-10-CM

## 2013-07-27 DIAGNOSIS — IMO0002 Reserved for concepts with insufficient information to code with codable children: Secondary | ICD-10-CM

## 2013-07-27 DIAGNOSIS — R82998 Other abnormal findings in urine: Secondary | ICD-10-CM | POA: Diagnosis not present

## 2013-07-30 ENCOUNTER — Ambulatory Visit (INDEPENDENT_AMBULATORY_CARE_PROVIDER_SITE_OTHER): Payer: Medicare Other | Admitting: Family Medicine

## 2013-07-30 ENCOUNTER — Encounter: Payer: Self-pay | Admitting: Family Medicine

## 2013-07-30 VITALS — BP 122/82 | Ht 68.5 in | Wt 157.0 lb

## 2013-07-30 DIAGNOSIS — J019 Acute sinusitis, unspecified: Secondary | ICD-10-CM | POA: Diagnosis not present

## 2013-07-30 MED ORDER — BENZONATATE 100 MG PO CAPS: 200.0000 mg | ORAL_CAPSULE | Freq: Three times a day (TID) | ORAL | Status: DC | PRN

## 2013-07-30 MED ORDER — AMOXICILLIN 500 MG PO TABS: 500.0000 mg | ORAL_TABLET | Freq: Two times a day (BID) | ORAL | Status: DC

## 2013-07-30 NOTE — Progress Notes (Signed)
   Subjective:    Patient ID: Randy [name], male    DOB: December 02, 1928, 80 y.o.   MRN: [id number]  Cough This is a new problem. The current episode started in the past 7 days. The problem has been gradually worsening. The problem occurs constantly. The cough is non-productive. Associated symptoms include ear congestion, headaches, myalgias, nasal congestion, rhinorrhea and a sore throat. Pertinent negatives include no chest pain, ear pain, fever or wheezing. He has tried OTC cough suppressant for the symptoms. The treatment provided no relief.   PMH benign   Review of Systems  Constitutional: Negative for fever and activity change.  HENT: Positive for congestion, rhinorrhea and sore throat. Negative for ear pain.   Eyes: Negative for discharge.  Respiratory: Positive for cough. Negative for wheezing.   Cardiovascular: Negative for chest pain.  Musculoskeletal: Positive for myalgias.  Neurological: Positive for headaches.       Objective:   Physical Exam  Nursing note and vitals reviewed. Constitutional: He appears well-developed.  HENT:  Head: Normocephalic.  Mouth/Throat: Oropharynx is clear and moist. No oropharyngeal exudate.  Neck: Normal range of motion.  Cardiovascular: Normal rate, regular rhythm and normal heart sounds.   No murmur heard. Pulmonary/Chest: Effort normal and breath sounds normal. He has no wheezes.  Lymphadenopathy:    He has no cervical adenopathy.  Neurological: He exhibits normal muscle tone.  Skin: Skin is warm and dry.          Assessment & Plan:  Cough-I. Feel she is here as a mild sinus related illness. I believe it started first with a viral illness triggered into a sinus infection. Tessalon and amoxicillin warning signs discussed

## 2013-08-10 ENCOUNTER — Encounter: Payer: Self-pay | Admitting: Family Medicine

## 2013-08-10 ENCOUNTER — Ambulatory Visit (INDEPENDENT_AMBULATORY_CARE_PROVIDER_SITE_OTHER): Payer: Medicare Other | Admitting: Family Medicine

## 2013-08-10 VITALS — BP 122/88 | Temp 98.4°F | Ht 69.0 in | Wt 154.0 lb

## 2013-08-10 DIAGNOSIS — I951 Orthostatic hypotension: Secondary | ICD-10-CM | POA: Diagnosis not present

## 2013-08-10 DIAGNOSIS — I10 Essential (primary) hypertension: Secondary | ICD-10-CM | POA: Diagnosis not present

## 2013-08-10 DIAGNOSIS — R0989 Other specified symptoms and signs involving the circulatory and respiratory systems: Secondary | ICD-10-CM | POA: Diagnosis not present

## 2013-08-10 DIAGNOSIS — R0609 Other forms of dyspnea: Secondary | ICD-10-CM

## 2013-08-10 MED ORDER — AMLODIPINE BESYLATE 2.5 MG PO TABS: 2.5000 mg | ORAL_TABLET | Freq: Every day | ORAL | Status: DC

## 2013-08-10 MED ORDER — ALBUTEROL SULFATE HFA 108 (90 BASE) MCG/ACT IN AERS: 2.0000 | INHALATION_SPRAY | Freq: Four times a day (QID) | RESPIRATORY_TRACT | Status: DC | PRN

## 2013-08-10 NOTE — Patient Instructions (Addendum)
Use albuterol as a rescue inhaler, 2 puffs as need  Reduce Norvasc from 5 mg to 2.5mg 

## 2013-08-10 NOTE — Progress Notes (Signed)
   Subjective:    Patient ID: Randy [name], male    DOB: [birth year], 80 y.o.   MRN: [id number]  HPIFollow up on cough. Started about 1 month ago. Shortness of breath, diarrhea, light headed, appetite not good. Taking amoxil has four days left and stopped taking tessalon pearls because he thought he could cough up more mucus without taking cough med.  Patient denies any chest pressure tightness pain or swelling in the legs he does state he gets a little dizzy and lightheaded when he stands up. He relates his urination bowel movements are doing fine. Past medical history noted.   Review of Systems He does relate intermittent shortness of breath at times feels a little short winded when he lays down but denies any swelling in the legs no true PND no angina symptoms most of the time when he walks he doesn't get short of breath.    Objective:   Physical Exam Lungs are clear hearts regular pulse normal neck no masses Blood pressure laying sitting standing was taken. He does have a moderate drop in blood pressure as he goes from laying sitting to standing. Patient does not have any significant tachypnea with walking the hallways. Oxygen level XCIII at rest 93 after taking several labs. Extremities no edema.       Assessment & Plan:  #1 HTN-I. don't feel he needs 5 mg amlodipine. Reduce this to 2.5 mg daily. In addition to this recheck him again in one month's time we will check orthostatics.  #2 some element of COPD but not bad enough to be on a daily inhaler may use albuterol when necessary I do not feel that this patient has CHF currently. We will monitor for symptoms of this.  #3 recent upper rest Juanito Doom seems to be resolving

## 2013-09-07 ENCOUNTER — Encounter: Payer: Self-pay | Admitting: Family Medicine

## 2013-09-07 ENCOUNTER — Other Ambulatory Visit: Payer: Self-pay | Admitting: *Deleted

## 2013-09-07 ENCOUNTER — Ambulatory Visit (INDEPENDENT_AMBULATORY_CARE_PROVIDER_SITE_OTHER): Payer: Medicare Other | Admitting: Family Medicine

## 2013-09-07 VITALS — BP 114/82 | Ht 69.0 in | Wt 157.0 lb

## 2013-09-07 DIAGNOSIS — I10 Essential (primary) hypertension: Secondary | ICD-10-CM | POA: Diagnosis not present

## 2013-09-07 DIAGNOSIS — F411 Generalized anxiety disorder: Secondary | ICD-10-CM

## 2013-09-07 MED ORDER — TRAMADOL HCL 50 MG PO TABS: 50.0000 mg | ORAL_TABLET | Freq: Four times a day (QID) | ORAL | Status: DC | PRN

## 2013-09-07 MED ORDER — ALPRAZOLAM 0.5 MG PO TABS: 0.2500 mg | ORAL_TABLET | Freq: Two times a day (BID) | ORAL | Status: DC

## 2013-09-07 NOTE — Progress Notes (Signed)
   Subjective:    Patient ID: Randy [name], male    DOB: 1928-11-11, 80 y.o.   MRN: [id number]  HPI Pt is here today for a f/u appt d/t hypotension and cough. Denies any specific troubles currently has history hypertension anxiety issues Pt states the cough is gone.  He still c/o daily episodes of dizziness. He states this occurs if he stands up too quick or turns too quickly    Review of Systems Patient denies shortness breath chest pain nausea vomiting high fevers denies rectal bleeding    Objective:   Physical Exam Lungs clear hearts regular abdomen soft extremities no edema Orthostatics was taken no significant changes       Assessment & Plan:  #1 anxiety issues-the Xanax on a when necessary basis does not overuse it refill given  #2 hypertension blood pressure on current regimen good keep as is no need to adjust further followup again in 3 months time #3 generalized arthralgias worse at nighttime tramadol as necessary refills given.

## 2013-10-18 ENCOUNTER — Telehealth: Payer: Self-pay | Admitting: Family Medicine

## 2013-10-18 MED ORDER — TAMSULOSIN HCL 0.4 MG PO CAPS: ORAL_CAPSULE | ORAL | Status: DC

## 2013-10-18 NOTE — Telephone Encounter (Signed)
Patient needs Rx for tamsulosin HCL 0.4 mg capsule 0.4 mg

## 2013-10-18 NOTE — Telephone Encounter (Signed)
done

## 2013-10-19 ENCOUNTER — Telehealth: Payer: Self-pay | Admitting: Family Medicine

## 2013-10-19 NOTE — Telephone Encounter (Signed)
Was done yesterday

## 2013-10-19 NOTE — Telephone Encounter (Signed)
Patient needs Rx for tamsulosin HCL 0.4 mg capsule

## 2013-11-23 ENCOUNTER — Ambulatory Visit (INDEPENDENT_AMBULATORY_CARE_PROVIDER_SITE_OTHER): Payer: Medicare Other | Admitting: Urology

## 2013-11-23 DIAGNOSIS — C679 Malignant neoplasm of bladder, unspecified: Secondary | ICD-10-CM | POA: Diagnosis not present

## 2013-11-23 DIAGNOSIS — D090 Carcinoma in situ of bladder: Secondary | ICD-10-CM | POA: Diagnosis not present

## 2013-11-26 ENCOUNTER — Other Ambulatory Visit: Payer: Self-pay | Admitting: Family Medicine

## 2013-12-23 ENCOUNTER — Ambulatory Visit (INDEPENDENT_AMBULATORY_CARE_PROVIDER_SITE_OTHER): Payer: Medicare Other | Admitting: Family Medicine

## 2013-12-23 ENCOUNTER — Encounter: Payer: Self-pay | Admitting: Family Medicine

## 2013-12-23 VITALS — BP 132/88 | Ht 69.0 in | Wt 157.0 lb

## 2013-12-23 DIAGNOSIS — I10 Essential (primary) hypertension: Secondary | ICD-10-CM

## 2013-12-23 DIAGNOSIS — I951 Orthostatic hypotension: Secondary | ICD-10-CM

## 2013-12-23 DIAGNOSIS — IMO0001 Reserved for inherently not codable concepts without codable children: Secondary | ICD-10-CM | POA: Diagnosis not present

## 2013-12-23 DIAGNOSIS — M2550 Pain in unspecified joint: Secondary | ICD-10-CM | POA: Diagnosis not present

## 2013-12-23 DIAGNOSIS — M791 Myalgia, unspecified site: Secondary | ICD-10-CM

## 2013-12-23 NOTE — Progress Notes (Signed)
   Subjective:    Patient ID: Randy [name], male    DOB: June 08, 1929, 80 y.o.   MRN: [id number]  HPILightheadedness. Ongoing. Decreased amlodipine from 5mg  to 2.5mg  at last visit.   Nerve pain all over. Taking tramadol one at night to keep from waking up due to pain.   Had bloodwork done in June by Dr. Diona Fanti.  Appetite good Aches in the lower legs and in the shoulders Some positional dizziness Patient denies any nausea vomiting diarrhea denies rectal bleeding   Review of Systems He denies chest tightness pressure pain he does relate positional dizziness.    Objective:   Physical Exam  Lungs are clear heart is regular pulse normal blood pressure taken laying sitting standing there is some drop. Extremities no edema. Abdomen is soft no masses felt.  Denies any soreness in his muscles on palpation.    Assessment & Plan:  Myalgias and joint discomfort will check a sedimentation rate  Positional dizziness related to his medications and his age. Techniques were discussed how to best deal with this

## 2013-12-24 LAB — SEDIMENTATION RATE: Sed Rate: 10 mm/hr (ref 0–16)

## 2013-12-25 LAB — CK TOTAL AND CKMB (NOT AT ARMC)
CK TOTAL: 48 U/L (ref 7–232)
CK, MB: 0.8 ng/mL (ref 0.0–5.0)

## 2013-12-30 DIAGNOSIS — H33309 Unspecified retinal break, unspecified eye: Secondary | ICD-10-CM | POA: Diagnosis not present

## 2013-12-30 DIAGNOSIS — H33019 Retinal detachment with single break, unspecified eye: Secondary | ICD-10-CM | POA: Diagnosis not present

## 2014-01-20 ENCOUNTER — Encounter: Payer: Self-pay | Admitting: Family Medicine

## 2014-01-20 ENCOUNTER — Ambulatory Visit (INDEPENDENT_AMBULATORY_CARE_PROVIDER_SITE_OTHER): Payer: Medicare Other | Admitting: Family Medicine

## 2014-01-20 VITALS — BP 134/80 | Ht 69.0 in | Wt 157.0 lb

## 2014-01-20 DIAGNOSIS — IMO0001 Reserved for inherently not codable concepts without codable children: Secondary | ICD-10-CM

## 2014-01-20 DIAGNOSIS — R5381 Other malaise: Secondary | ICD-10-CM

## 2014-01-20 DIAGNOSIS — R5383 Other fatigue: Secondary | ICD-10-CM | POA: Diagnosis not present

## 2014-01-20 DIAGNOSIS — I10 Essential (primary) hypertension: Secondary | ICD-10-CM | POA: Diagnosis not present

## 2014-01-20 DIAGNOSIS — I491 Atrial premature depolarization: Secondary | ICD-10-CM | POA: Diagnosis not present

## 2014-01-20 DIAGNOSIS — M791 Myalgia, unspecified site: Secondary | ICD-10-CM

## 2014-01-20 MED ORDER — TRAMADOL HCL 50 MG PO TABS: 50.0000 mg | ORAL_TABLET | Freq: Three times a day (TID) | ORAL | Status: DC | PRN

## 2014-01-20 NOTE — Progress Notes (Addendum)
   Subjective:    Patient ID: Cohl Behrens, male    DOB: 1929/03/24, 80 y.o.   MRN: [id number]  HPI Patient is here today for a follow up visit on his joint/muscle pain. Patient has been taking tramadol and it provides significant relief. Patient notes feeling tired and fatigued at times also. Patient states that he has no other concerns at this time.  He is holding his weight. He's not having any fevers joint swelling rash sweats chills nausea or vomiting  Review of Systems See above    Objective:   Physical Exam  Vitals reviewed. Constitutional: He appears well-nourished. No distress.  Cardiovascular: Normal rate, regular rhythm and normal heart sounds.   No murmur heard. Pulmonary/Chest: Effort normal and breath sounds normal. No respiratory distress.  Musculoskeletal: He exhibits no edema.  Lymphadenopathy:    He has no cervical adenopathy.  Neurological: He is alert.  Psychiatric: His behavior is normal.          Assessment & Plan:  r eye possible surgery coming up  Myalgia- tramadol 2 to 3 times a day  Basic lab work was ordered. Await the results. If lab tests look normal followup again in 2 months  EKG was done because patient did have some irregularity to his heart. EKG just showed PACs no need for any other intervention

## 2014-01-21 LAB — HEPATIC FUNCTION PANEL
ALK PHOS: 54 U/L (ref 39–117)
ALT: 13 U/L (ref 0–53)
AST: 14 U/L (ref 0–37)
Albumin: 3.9 g/dL (ref 3.5–5.2)
BILIRUBIN INDIRECT: 0.3 mg/dL (ref 0.2–1.2)
BILIRUBIN TOTAL: 0.4 mg/dL (ref 0.2–1.2)
Bilirubin, Direct: 0.1 mg/dL (ref 0.0–0.3)
Total Protein: 6.9 g/dL (ref 6.0–8.3)

## 2014-01-21 LAB — BASIC METABOLIC PANEL
BUN: 22 mg/dL (ref 6–23)
CALCIUM: 9.4 mg/dL (ref 8.4–10.5)
CO2: 30 mEq/L (ref 19–32)
Chloride: 100 mEq/L (ref 96–112)
Creat: 1.26 mg/dL (ref 0.50–1.35)
Glucose, Bld: 81 mg/dL (ref 70–99)
Potassium: 4.8 mEq/L (ref 3.5–5.3)
SODIUM: 139 meq/L (ref 135–145)

## 2014-01-21 LAB — CBC WITH DIFFERENTIAL/PLATELET
BASOS PCT: 0 % (ref 0–1)
Basophils Absolute: 0 10*3/uL (ref 0.0–0.1)
EOS ABS: 0.3 10*3/uL (ref 0.0–0.7)
Eosinophils Relative: 5 % (ref 0–5)
HCT: 45.9 % (ref 39.0–52.0)
Hemoglobin: 16 g/dL (ref 13.0–17.0)
Lymphocytes Relative: 27 % (ref 12–46)
Lymphs Abs: 1.7 10*3/uL (ref 0.7–4.0)
MCH: 30 pg (ref 26.0–34.0)
MCHC: 34.9 g/dL (ref 30.0–36.0)
MCV: 86 fL (ref 78.0–100.0)
Monocytes Absolute: 0.9 10*3/uL (ref 0.1–1.0)
Monocytes Relative: 14 % — ABNORMAL HIGH (ref 3–12)
NEUTROS ABS: 3.4 10*3/uL (ref 1.7–7.7)
Neutrophils Relative %: 54 % (ref 43–77)
PLATELETS: 185 10*3/uL (ref 150–400)
RBC: 5.34 MIL/uL (ref 4.22–5.81)
RDW: 13.7 % (ref 11.5–15.5)
WBC: 6.3 10*3/uL (ref 4.0–10.5)

## 2014-01-28 ENCOUNTER — Encounter: Payer: Self-pay | Admitting: Family Medicine

## 2014-03-03 DIAGNOSIS — T859XXA Unspecified complication of internal prosthetic device, implant and graft, initial encounter: Secondary | ICD-10-CM | POA: Diagnosis not present

## 2014-03-03 DIAGNOSIS — Z961 Presence of intraocular lens: Secondary | ICD-10-CM | POA: Diagnosis not present

## 2014-03-08 ENCOUNTER — Encounter (HOSPITAL_COMMUNITY): Payer: Self-pay | Admitting: Pharmacy Technician

## 2014-03-10 DIAGNOSIS — T859XXA Unspecified complication of internal prosthetic device, implant and graft, initial encounter: Secondary | ICD-10-CM | POA: Diagnosis not present

## 2014-03-17 ENCOUNTER — Encounter: Payer: Self-pay | Admitting: Family Medicine

## 2014-03-17 ENCOUNTER — Ambulatory Visit (INDEPENDENT_AMBULATORY_CARE_PROVIDER_SITE_OTHER): Payer: Medicare Other | Admitting: Family Medicine

## 2014-03-17 VITALS — BP 128/72 | Ht 69.0 in | Wt 155.0 lb

## 2014-03-17 DIAGNOSIS — R5383 Other fatigue: Secondary | ICD-10-CM | POA: Diagnosis not present

## 2014-03-17 DIAGNOSIS — R739 Hyperglycemia, unspecified: Secondary | ICD-10-CM

## 2014-03-17 DIAGNOSIS — R7309 Other abnormal glucose: Secondary | ICD-10-CM

## 2014-03-17 DIAGNOSIS — E785 Hyperlipidemia, unspecified: Secondary | ICD-10-CM | POA: Diagnosis not present

## 2014-03-17 DIAGNOSIS — R5381 Other malaise: Secondary | ICD-10-CM

## 2014-03-17 DIAGNOSIS — Z23 Encounter for immunization: Secondary | ICD-10-CM

## 2014-03-17 NOTE — Progress Notes (Signed)
   Subjective:    Patient ID: Randy [name], male    DOB: 1929-02-25, 80 y.o.   MRN: [id number]  HPISurgical clearance for eye surgery with Dr. Geoffry Paradise.  He relates he is able to do housework does work outside he does not experience any chest pain or shortness of breath with it. He does exert himself to a level of 4-6 METS. No signs of cardiac disease.  Having fatigue but pt states it has been going on for awhile.  This is been intermittent for a while. No fevers associated with it no loss of weight vomiting appetite seem to Flu vaccine.    Review of Systems  Constitutional: Negative for activity change, appetite change and fatigue.  HENT: Negative for congestion.   Respiratory: Negative for cough.   Cardiovascular: Negative for chest pain.  Gastrointestinal: Negative for abdominal pain.  Endocrine: Negative for polydipsia and polyphagia.  Neurological: Negative for weakness.  Psychiatric/Behavioral: Negative for confusion.       Objective:   Physical Exam  Vitals reviewed. Constitutional: He appears well-nourished. No distress.  Cardiovascular: Normal rate, regular rhythm and normal heart sounds.   No murmur heard. Pulmonary/Chest: Effort normal and breath sounds normal. No respiratory distress.  Musculoskeletal: He exhibits no edema.  Lymphadenopathy:    He has no cervical adenopathy.  Neurological: He is alert.  Psychiatric: His behavior is normal.          Assessment & Plan:  Surgery-this person is very capable of going forward with the surgery. There is no significant surgical risk. He is in a low risk category.   Intermittent fatigue-we will go ahead and do some lab work await the results of this. I don't have a great explanation for his intermittent fatigue could be age related wait for lab work   Hyperlipidemia-will check labs await the results he is going to followup in mid October

## 2014-03-21 ENCOUNTER — Ambulatory Visit: Payer: Medicare Other | Admitting: Family Medicine

## 2014-03-21 DIAGNOSIS — E785 Hyperlipidemia, unspecified: Secondary | ICD-10-CM | POA: Diagnosis not present

## 2014-03-21 DIAGNOSIS — R5383 Other fatigue: Secondary | ICD-10-CM | POA: Diagnosis not present

## 2014-03-21 DIAGNOSIS — R5381 Other malaise: Secondary | ICD-10-CM | POA: Diagnosis not present

## 2014-03-21 DIAGNOSIS — R7309 Other abnormal glucose: Secondary | ICD-10-CM | POA: Diagnosis not present

## 2014-03-21 LAB — CBC WITH DIFFERENTIAL/PLATELET
Basophils Absolute: 0 10*3/uL (ref 0.0–0.1)
Basophils Relative: 0 % (ref 0–1)
EOS ABS: 0.3 10*3/uL (ref 0.0–0.7)
Eosinophils Relative: 6 % — ABNORMAL HIGH (ref 0–5)
HCT: 47.4 % (ref 39.0–52.0)
HEMOGLOBIN: 15.9 g/dL (ref 13.0–17.0)
Lymphocytes Relative: 31 % (ref 12–46)
Lymphs Abs: 1.5 10*3/uL (ref 0.7–4.0)
MCH: 30.1 pg (ref 26.0–34.0)
MCHC: 33.5 g/dL (ref 30.0–36.0)
MCV: 89.6 fL (ref 78.0–100.0)
MONOS PCT: 15 % — AB (ref 3–12)
Monocytes Absolute: 0.7 10*3/uL (ref 0.1–1.0)
NEUTROS PCT: 48 % (ref 43–77)
Neutro Abs: 2.3 10*3/uL (ref 1.7–7.7)
Platelets: 177 10*3/uL (ref 150–400)
RBC: 5.29 MIL/uL (ref 4.22–5.81)
RDW: 13.8 % (ref 11.5–15.5)
WBC: 4.8 10*3/uL (ref 4.0–10.5)

## 2014-03-22 LAB — LIPID PANEL
CHOL/HDL RATIO: 3 ratio
CHOLESTEROL: 160 mg/dL (ref 0–200)
HDL: 54 mg/dL (ref >39)
LDL Cholesterol: 87 mg/dL (ref 0–99)
Triglycerides: 93 mg/dL (ref <150)
VLDL: 19 mg/dL (ref 0–40)

## 2014-03-22 LAB — BASIC METABOLIC PANEL
BUN: 19 mg/dL (ref 6–23)
CO2: 30 mEq/L (ref 19–32)
Calcium: 9.3 mg/dL (ref 8.4–10.5)
Chloride: 101 mEq/L (ref 96–112)
Creat: 1.19 mg/dL (ref 0.50–1.35)
Glucose, Bld: 97 mg/dL (ref 70–99)
POTASSIUM: 5.1 meq/L (ref 3.5–5.3)
Sodium: 139 mEq/L (ref 135–145)

## 2014-03-22 LAB — HEMOGLOBIN A1C
HEMOGLOBIN A1C: 6.2 % — AB (ref <5.7)
Mean Plasma Glucose: 131 mg/dL — ABNORMAL HIGH (ref <117)

## 2014-03-22 LAB — TSH: TSH: 1.385 u[IU]/mL (ref 0.350–4.500)

## 2014-03-23 ENCOUNTER — Encounter (HOSPITAL_COMMUNITY)
Admission: RE | Admit: 2014-03-23 | Discharge: 2014-03-23 | Disposition: A | Discharge status: Home / Self Care | Payer: Medicare Other | Source: Ambulatory Visit | Attending: Ophthalmology | Admitting: Ophthalmology

## 2014-03-23 ENCOUNTER — Encounter (HOSPITAL_COMMUNITY): Payer: Self-pay

## 2014-03-23 DIAGNOSIS — T8529XA Other mechanical complication of intraocular lens, initial encounter: Secondary | ICD-10-CM | POA: Insufficient documentation

## 2014-03-23 DIAGNOSIS — Z01818 Encounter for other preprocedural examination: Secondary | ICD-10-CM | POA: Diagnosis not present

## 2014-03-23 NOTE — Patient Instructions (Signed)
     Randy [name]  03/23/2014   Your procedure is scheduled on:  03/31/2014  Report to [name] at  1200 PM.  Call this number if you have problems the morning of surgery: [phone]   Remember:   Do not eat food or drink liquids after midnight.   Take these medicines the morning of surgery with A SIP OF WATER: xanax, amlodipine, tramdol. Take your albuterol inhaler before you come.   Do not wear jewelry, make-up or nail polish.  Do not wear lotions, powders, or perfumes.   Do not shave 48 hours prior to surgery. Men may shave face and neck.  Do not bring valuables to the hospital.  [facility] is not responsible for any belongings or valuables.               Contacts, dentures or bridgework may not be worn into surgery.  Leave suitcase in the car. After surgery it may be brought to your room.  For patients admitted to the hospital, discharge time is determined by your treatment team.               Patients discharged the day of surgery will not be allowed to drive home.  Name and phone number of your driver: family  Special Instructions: N/A   Please read over the following fact sheets that you were given: Pain Booklet, Coughing and Deep Breathing, Surgical Site Infection Prevention, Anesthesia Post-op Instructions and Care and Recovery After Surgery PATIENT INSTRUCTIONS POST-ANESTHESIA  IMMEDIATELY FOLLOWING SURGERY:  Do not drive or operate machinery for the first twenty four hours after surgery.  Do not make any important decisions for twenty four hours after surgery or while taking narcotic pain medications or sedatives.  If you develop intractable nausea and vomiting or a severe headache please notify your doctor immediately.  FOLLOW-UP:  Please make an appointment with your surgeon as instructed. You do not need to follow up with anesthesia unless specifically instructed to do so.  WOUND CARE INSTRUCTIONS (if applicable):  Keep a dry clean dressing on the  anesthesia/puncture wound site if there is drainage.  Once the wound has quit draining you may leave it open to air.  Generally you should leave the bandage intact for twenty four hours unless there is drainage.  If the epidural site drains for more than 36-48 hours please call the anesthesia department.  QUESTIONS?:  Please feel free to call your physician or the hospital operator if you have any questions, and they will be happy to assist you.

## 2014-03-30 MED ORDER — CYCLOPENTOLATE-PHENYLEPHRINE OP SOLN OPTIME - NO CHARGE
OPHTHALMIC | Status: AC
  Filled 2014-03-30: qty 2

## 2014-03-30 MED ORDER — PHENYLEPHRINE HCL 2.5 % OP SOLN
OPHTHALMIC | Status: AC
  Filled 2014-03-30: qty 15

## 2014-03-30 MED ORDER — LIDOCAINE HCL (PF) 1 % IJ SOLN
INTRAMUSCULAR | Status: AC
  Filled 2014-03-30: qty 2

## 2014-03-30 MED ORDER — TETRACAINE HCL 0.5 % OP SOLN
OPHTHALMIC | Status: AC
  Filled 2014-03-30: qty 2

## 2014-03-30 MED ORDER — LIDOCAINE HCL 3.5 % OP GEL
OPHTHALMIC | Status: AC
  Filled 2014-03-30: qty 1

## 2014-03-30 MED ORDER — NEOMYCIN-POLYMYXIN-DEXAMETH 3.5-10000-0.1 OP SUSP
OPHTHALMIC | Status: AC
  Filled 2014-03-30: qty 5

## 2014-03-31 ENCOUNTER — Encounter (HOSPITAL_COMMUNITY): Payer: Medicare Other | Admitting: Anesthesiology

## 2014-03-31 ENCOUNTER — Encounter (HOSPITAL_COMMUNITY): Payer: Self-pay

## 2014-03-31 ENCOUNTER — Ambulatory Visit (HOSPITAL_COMMUNITY): Payer: Medicare Other | Admitting: Anesthesiology

## 2014-03-31 ENCOUNTER — Encounter (HOSPITAL_COMMUNITY)
Admission: RE | Disposition: A | Discharge status: Home / Self Care | Payer: Self-pay | Source: Ambulatory Visit | Attending: Ophthalmology

## 2014-03-31 ENCOUNTER — Ambulatory Visit (HOSPITAL_COMMUNITY)
Admission: RE | Admit: 2014-03-31 | Discharge: 2014-03-31 | Disposition: A | Discharge status: Home / Self Care | Payer: Medicare Other | Source: Ambulatory Visit | Attending: Ophthalmology | Admitting: Ophthalmology

## 2014-03-31 DIAGNOSIS — J449 Chronic obstructive pulmonary disease, unspecified: Secondary | ICD-10-CM | POA: Diagnosis not present

## 2014-03-31 DIAGNOSIS — T859XXA Unspecified complication of internal prosthetic device, implant and graft, initial encounter: Secondary | ICD-10-CM | POA: Diagnosis not present

## 2014-03-31 DIAGNOSIS — Z7951 Long term (current) use of inhaled steroids: Secondary | ICD-10-CM | POA: Insufficient documentation

## 2014-03-31 DIAGNOSIS — K219 Gastro-esophageal reflux disease without esophagitis: Secondary | ICD-10-CM | POA: Diagnosis not present

## 2014-03-31 DIAGNOSIS — F419 Anxiety disorder, unspecified: Secondary | ICD-10-CM | POA: Diagnosis not present

## 2014-03-31 DIAGNOSIS — Z79899 Other long term (current) drug therapy: Secondary | ICD-10-CM | POA: Diagnosis not present

## 2014-03-31 DIAGNOSIS — T8522XA Displacement of intraocular lens, initial encounter: Secondary | ICD-10-CM | POA: Insufficient documentation

## 2014-03-31 DIAGNOSIS — Z87891 Personal history of nicotine dependence: Secondary | ICD-10-CM | POA: Insufficient documentation

## 2014-03-31 DIAGNOSIS — I10 Essential (primary) hypertension: Secondary | ICD-10-CM | POA: Diagnosis not present

## 2014-03-31 DIAGNOSIS — H259 Unspecified age-related cataract: Secondary | ICD-10-CM | POA: Diagnosis not present

## 2014-03-31 HISTORY — PX: REPOSITION OF LENS: SHX6069

## 2014-03-31 SURGERY — REPOSITIONING, IOL
Anesthesia: Monitor Anesthesia Care | Laterality: Right

## 2014-03-31 MED ORDER — EPINEPHRINE HCL 1 MG/ML IJ SOLN
INTRAMUSCULAR | Status: AC
  Filled 2014-03-31: qty 1

## 2014-03-31 MED ORDER — FENTANYL CITRATE 0.05 MG/ML IJ SOLN
INTRAMUSCULAR | Status: AC
  Filled 2014-03-31: qty 2

## 2014-03-31 MED ORDER — LACTATED RINGERS IV SOLN
INTRAVENOUS | Status: DC
  Administered 2014-03-31: 13:00:00 via INTRAVENOUS

## 2014-03-31 MED ORDER — EPINEPHRINE HCL 1 MG/ML IJ SOLN
INTRAOCULAR | Status: DC | PRN
  Administered 2014-03-31: 14:00:00

## 2014-03-31 MED ORDER — NEOMYCIN-POLYMYXIN-DEXAMETH 3.5-10000-0.1 OP SUSP
OPHTHALMIC | Status: DC | PRN
  Administered 2014-03-31: 2 [drp] via OPHTHALMIC

## 2014-03-31 MED ORDER — MIDAZOLAM HCL 2 MG/2ML IJ SOLN
1.0000 mg | INTRAMUSCULAR | Status: DC | PRN
  Administered 2014-03-31: 2 mg via INTRAVENOUS

## 2014-03-31 MED ORDER — CYCLOPENTOLATE-PHENYLEPHRINE 0.2-1 % OP SOLN
1.0000 [drp] | OPHTHALMIC | Status: AC
  Administered 2014-03-31 (×3): 1 [drp] via OPHTHALMIC

## 2014-03-31 MED ORDER — FENTANYL CITRATE 0.05 MG/ML IJ SOLN
25.0000 ug | INTRAMUSCULAR | Status: AC
  Administered 2014-03-31: 25 ug via INTRAVENOUS

## 2014-03-31 MED ORDER — MIDAZOLAM HCL 5 MG/5ML IJ SOLN
INTRAMUSCULAR | Status: DC | PRN
  Administered 2014-03-31 (×2): 1 mg via INTRAVENOUS

## 2014-03-31 MED ORDER — LIDOCAINE HCL 3.5 % OP GEL: 1.0000 "application " | Freq: Once | OPHTHALMIC | Status: DC

## 2014-03-31 MED ORDER — TETRACAINE HCL 0.5 % OP SOLN
1.0000 [drp] | OPHTHALMIC | Status: AC
  Administered 2014-03-31 (×3): 1 [drp] via OPHTHALMIC

## 2014-03-31 MED ORDER — BSS IO SOLN
INTRAOCULAR | Status: DC | PRN
  Administered 2014-03-31: 15 mL

## 2014-03-31 MED ORDER — POVIDONE-IODINE 5 % OP SOLN
OPHTHALMIC | Status: DC | PRN
  Administered 2014-03-31: 1 via OPHTHALMIC

## 2014-03-31 MED ORDER — LIDOCAINE HCL (PF) 1 % IJ SOLN
INTRAOCULAR | Status: DC | PRN
  Administered 2014-03-31: 14:00:00 via OPHTHALMIC

## 2014-03-31 MED ORDER — MIDAZOLAM HCL 2 MG/2ML IJ SOLN
INTRAMUSCULAR | Status: AC
  Filled 2014-03-31: qty 2

## 2014-03-31 MED ORDER — PROVISC 10 MG/ML IO SOLN
INTRAOCULAR | Status: DC | PRN
  Administered 2014-03-31: 0.85 mL via INTRAOCULAR

## 2014-03-31 MED ORDER — PHENYLEPHRINE HCL 2.5 % OP SOLN
1.0000 [drp] | OPHTHALMIC | Status: AC
  Administered 2014-03-31 (×3): 1 [drp] via OPHTHALMIC

## 2014-03-31 MED ORDER — MIDAZOLAM HCL 5 MG/5ML IJ SOLN
INTRAMUSCULAR | Status: AC
  Filled 2014-03-31: qty 5

## 2014-03-31 MED ORDER — LACTATED RINGERS IV SOLN
INTRAVENOUS | Status: DC | PRN
  Administered 2014-03-31: 12:00:00 via INTRAVENOUS

## 2014-03-31 SURGICAL SUPPLY — 14 items
CLOTH BEACON ORANGE TIMEOUT ST (SAFETY) ×2 IMPLANT
EYE SHIELD UNIVERSAL CLEAR (GAUZE/BANDAGES/DRESSINGS) ×2 IMPLANT
GLOVE BIOGEL PI IND STRL 7.0 (GLOVE) IMPLANT
GLOVE BIOGEL PI IND STRL 7.5 (GLOVE) IMPLANT
GLOVE BIOGEL PI INDICATOR 7.0 (GLOVE) ×2
GLOVE BIOGEL PI INDICATOR 7.5 (GLOVE) ×2
PAD ARMBOARD 7.5X6 YLW CONV (MISCELLANEOUS) ×2 IMPLANT
PROC W NO LENS (INTRAOCULAR LENS) ×3
PROCESS W NO LENS (INTRAOCULAR LENS) IMPLANT
RETRACTOR IRIS FLEX 25G GRIESH (INSTRUMENTS) ×2 IMPLANT
SYRINGE LUER LOK 1CC (MISCELLANEOUS) ×2 IMPLANT
TAPE SURG TRANSPORE 1 IN (GAUZE/BANDAGES/DRESSINGS) IMPLANT
TAPE SURGICAL TRANSPORE 1 IN (GAUZE/BANDAGES/DRESSINGS) ×2
WATER STERILE IRR 250ML POUR (IV SOLUTION) ×2 IMPLANT

## 2014-03-31 NOTE — Anesthesia Preprocedure Evaluation (Signed)
Anesthesia Evaluation  Patient identified by MRN, date of birth, ID band Patient awake    Reviewed: Allergy & Precautions, H&P , NPO status , Patient's Chart, lab work & pertinent test results  Airway Mallampati: II TM Distance: <3 FB Neck ROM: Full    Dental no notable dental hx.    Pulmonary neg pulmonary ROS, shortness of breath, COPDformer smoker,  breath sounds clear to auscultation  Pulmonary exam normal       Cardiovascular hypertension, negative cardio ROS  Rhythm:Regular Rate:Normal     Neuro/Psych Anxiety negative neurological ROS     GI/Hepatic Neg liver ROS, GERD-  Medicated and Controlled,  Endo/Other  negative endocrine ROS  Renal/GU negative Renal ROS  negative genitourinary   Musculoskeletal negative musculoskeletal ROS (+)   Abdominal   Peds negative pediatric ROS (+)  Hematology negative hematology ROS (+)   Anesthesia Other Findings   Reproductive/Obstetrics negative OB ROS                           Anesthesia Physical Anesthesia Plan  ASA: III  Anesthesia Plan: MAC   Post-op Pain Management:    Induction: Intravenous  Airway Management Planned: Nasal Cannula  Additional Equipment:   Intra-op Plan:   Post-operative Plan:   Informed Consent: I have reviewed the patients History and Physical, chart, labs and discussed the procedure including the risks, benefits and alternatives for the proposed anesthesia with the patient or authorized representative who has indicated his/her understanding and acceptance.     Plan Discussed with:   Anesthesia Plan Comments:         Anesthesia Quick Evaluation

## 2014-03-31 NOTE — Brief Op Note (Signed)
03/31/2014  2:36 PM  PATIENT:  Randy [name]  80 y.o. male  PRE-OPERATIVE DIAGNOSIS:  dislocated lens right eye; Progressive vision loss affecting activities of daily living  POST-OPERATIVE DIAGNOSIS:  dislocated lens right eye; Progressive vision loss affecting activities of daily living  PROCEDURE:  Procedure(s): REPOSITIONING WITH IRIS SUTURE FIXATION OF DISLOCATED INTRAOCULAR LENS IMPLANT RIGHT EYE (Right)  SURGEON:  Surgeon(s) and Role:    * Tonny Branch, MD - Primary  PHYSICIAN ASSISTANT:   ASSISTANTS: none   ANESTHESIA:   Topical with IV Sedation  EBL:  Total I/O In: 700 [I.V.:700] Out: -   BLOOD ADMINISTERED: None  DRAINS: none   LOCAL MEDICATIONS USED:  LIDOCAINE   SPECIMEN:  No Specimen  DISPOSITION OF SPECIMEN:  N/A  COUNTS:  YES  TOURNIQUET:  * No tourniquets in log *  DICTATION: .Note written in EPIC  PLAN OF CARE: Discharge to home after PACU  PATIENT DISPOSITION:  PACU - hemodynamically stable.   Delay start of Pharmacological VTE agent (>24hrs) due to surgical blood loss or risk of bleeding: not applicable

## 2014-03-31 NOTE — Discharge Instructions (Signed)

## 2014-03-31 NOTE — Anesthesia Procedure Notes (Signed)
Procedure Name: MAC Date/Time: 03/31/2014 1:36 PM Performed by: Andree Elk, AMY A Pre-anesthesia Checklist: Patient identified, Timeout performed, Emergency Drugs available, Suction available and Patient being monitored Oxygen Delivery Method: Nasal cannula

## 2014-03-31 NOTE — Transfer of Care (Signed)
Immediate Anesthesia Transfer of Care Note  Patient: Randy [name]  Procedure(s) Performed: Procedure(s): REPOSITIONING OF INTRAOCULAR LENS IMPLANT RIGHT EYE (Right)  Patient Location: Short Stay  Anesthesia Type:MAC  Level of Consciousness: awake, alert , oriented and patient cooperative  Airway & Oxygen Therapy: Patient Spontanous Breathing  Post-op Assessment: Report given to PACU RN and Post -op Vital signs reviewed and stable  Post vital signs: Reviewed and stable  Complications: No apparent anesthesia complications

## 2014-03-31 NOTE — H&P (Signed)
I have reviewed the H&P, the patient was re-examined, and I have identified no interval changes in medical condition and plan of care since the history and physical of record  

## 2014-03-31 NOTE — Anesthesia Postprocedure Evaluation (Signed)
  Anesthesia Post-op Note  Patient: Randy [name]  Procedure(s) Performed: Procedure(s): REPOSITIONING OF INTRAOCULAR LENS IMPLANT RIGHT EYE (Right)  Patient Location: Short Stay  Anesthesia Type:MAC  Level of Consciousness: awake, alert , oriented and patient cooperative  Airway and Oxygen Therapy: Patient Spontanous Breathing  Post-op Pain: none  Post-op Assessment: Post-op Vital signs reviewed, Patient's Cardiovascular Status Stable, Respiratory Function Stable, Patent Airway, No signs of Nausea or vomiting and Pain level controlled  Post-op Vital Signs: Reviewed and stable  Last Vitals:  Filed Vitals:   03/31/14 1216  Pulse: 93  Temp: 36.7 C  Resp: 18    Complications: No apparent anesthesia complications

## 2014-04-01 ENCOUNTER — Encounter (HOSPITAL_COMMUNITY): Payer: Self-pay | Admitting: Ophthalmology

## 2014-04-05 ENCOUNTER — Other Ambulatory Visit: Payer: Self-pay | Admitting: Family Medicine

## 2014-04-05 NOTE — Telephone Encounter (Signed)
Refill this k +4 additional refills

## 2014-04-08 ENCOUNTER — Ambulatory Visit (INDEPENDENT_AMBULATORY_CARE_PROVIDER_SITE_OTHER): Payer: Medicare Other | Admitting: Family Medicine

## 2014-04-08 ENCOUNTER — Encounter: Payer: Self-pay | Admitting: Family Medicine

## 2014-04-08 VITALS — BP 132/88 | Ht 69.0 in | Wt 155.0 lb

## 2014-04-08 DIAGNOSIS — F411 Generalized anxiety disorder: Secondary | ICD-10-CM

## 2014-04-08 DIAGNOSIS — I499 Cardiac arrhythmia, unspecified: Secondary | ICD-10-CM

## 2014-04-08 DIAGNOSIS — I10 Essential (primary) hypertension: Secondary | ICD-10-CM

## 2014-04-08 DIAGNOSIS — J3089 Other allergic rhinitis: Secondary | ICD-10-CM

## 2014-04-08 DIAGNOSIS — I491 Atrial premature depolarization: Secondary | ICD-10-CM

## 2014-04-08 DIAGNOSIS — Z23 Encounter for immunization: Secondary | ICD-10-CM

## 2014-04-08 NOTE — Progress Notes (Signed)
   Subjective:    Patient ID: Randy [name], male    DOB: 18-Aug-1928, 80 y.o.   MRN: [id number]  Hyperlipidemia This is a chronic problem. Recent lipid tests were reviewed and are variable. There are no compliance problems.    Patient states that the medication for his joint pain is helping. And he is please.  Has a fair amount of joint pain and discomfort the tramadol helps. Denies any complications with the does not cause drowsiness.  States allergies under decent control with using Flonase The patient relates anxiety issues uses Xanax occasionally and denies abusing it States he does try to eat healthy.  Review of Systems States heart irregular at times lungs are not having any wheezing no vomiting no fevers. No sweats appetite overall fairly good    Objective:   Physical Exam  Neck no masses lungs are clear no crackles heart is regular except for some irregular heartbeats. Abdomen soft extremities no edema.  EKG was completed shows premature H. rule contractions. No atrial fib.      Assessment & Plan:  This patient does have some mild arthralgias were age-related he would be fine for him to use tramadol when necessary  Allergy issues Flonase on a regular basis  HTN good control continue amlodipine  Anxiety issues and experiences care of his wife who has severe COPD

## 2014-04-18 ENCOUNTER — Encounter: Payer: Self-pay | Admitting: Family Medicine

## 2014-05-09 NOTE — Op Note (Signed)
Date of Admission: 03/31/2014  Date of Surgery: 03/31/2014   Pre-Op Dx: Dislocated Intra Ocular Lens Implant, Right Eye  Post-Op Dx: Dislocated Intra Ocular Lens Implant, Right Eye, Dx. Code; T85.2  Surgeon: Tonny Branch, M.D.  Assistants: None  Anesthesia: Topical/local with MAC  Indications: Poor vision right eye several years following cataract surgery.  Surgery: Repositioning and iris fixation of dislocated intraocular lens implant.  Discription: The patient had dilating drops and viscous lidocaine placed into the Right eye in the pre-op holding area. After transfer to the operating room, a time out was performed. The patient was then prepped and draped. Beginning with a 75 degree blade paracentesis ports were made in all quadrants. The anterior chamber was then filled with 1% non-preserved lidocaine. This was followed by filling the anterior chamber with Provisc and placing Provisc behind the implant to keep it from moving posteriorly.  The posterior chamber intraocular lens was mobilized into the anterior chamber, keeping the haptics behind the iris. Miochol solution was then used to constrict the pupil A 10-O prolene suture was passed through the iris, posterior to the inferior haptic and externalized. A knot was tied using a Modified Seipser technique. (3) knots were made in the same location. A suture was placed around the superior haptic in the same manner. The optic was then prolapsed back into the posterior chamber. The IOL was well-centered. The knots were then cut with Vanass scissors. Stromal hydration of the  paracentesis ports was performed with BSS on a Fine canula. The wounds were tested for leak which was negative. The patient tolerated the procedure well. There were no operative complications. The patient was then transferred to the recovery room in stable condition.  Complications: None  Specimen: None  EBL: None  Prosthetic device: None.

## 2014-05-10 ENCOUNTER — Ambulatory Visit (INDEPENDENT_AMBULATORY_CARE_PROVIDER_SITE_OTHER): Payer: Medicare Other | Admitting: Urology

## 2014-05-10 DIAGNOSIS — N320 Bladder-neck obstruction: Secondary | ICD-10-CM

## 2014-05-10 DIAGNOSIS — C679 Malignant neoplasm of bladder, unspecified: Secondary | ICD-10-CM | POA: Diagnosis not present

## 2014-05-10 DIAGNOSIS — N401 Enlarged prostate with lower urinary tract symptoms: Secondary | ICD-10-CM | POA: Diagnosis not present

## 2014-05-23 ENCOUNTER — Other Ambulatory Visit: Payer: Self-pay | Admitting: *Deleted

## 2014-05-23 ENCOUNTER — Telehealth: Payer: Self-pay | Admitting: Family Medicine

## 2014-05-23 MED ORDER — TRAMADOL HCL 50 MG PO TABS: 50.0000 mg | ORAL_TABLET | Freq: Two times a day (BID) | ORAL | Status: DC

## 2014-05-23 MED ORDER — TAMSULOSIN HCL 0.4 MG PO CAPS: 0.4000 mg | ORAL_CAPSULE | Freq: Two times a day (BID) | ORAL | Status: DC

## 2014-05-23 MED ORDER — FLUTICASONE PROPIONATE 50 MCG/ACT NA SUSP: NASAL | Status: DC

## 2014-05-23 NOTE — Telephone Encounter (Signed)
May have refills on all this and 5 additional

## 2014-05-23 NOTE — Telephone Encounter (Signed)
Last seen 04/08/2014. Can he have refills on tramadol.

## 2014-05-23 NOTE — Telephone Encounter (Signed)
meds sent to pharm. Pt notified on voicemail.  

## 2014-05-23 NOTE — Telephone Encounter (Signed)
Patient needs Rx for  Fluticasone 50 MCG/ACT nasal spray, tamsulosin .4 MG caps  Tramadol 50 mg tab [place].

## 2014-06-28 ENCOUNTER — Other Ambulatory Visit: Payer: Self-pay | Admitting: *Deleted

## 2014-06-28 MED ORDER — ALPRAZOLAM 0.5 MG PO TABS: ORAL_TABLET | ORAL | Status: DC

## 2014-07-08 ENCOUNTER — Other Ambulatory Visit: Payer: Self-pay | Admitting: *Deleted

## 2014-07-08 MED ORDER — AMLODIPINE BESYLATE 2.5 MG PO TABS: 2.5000 mg | ORAL_TABLET | Freq: Every day | ORAL | Status: DC

## 2014-08-10 DIAGNOSIS — Z029 Encounter for administrative examinations, unspecified: Secondary | ICD-10-CM

## 2014-08-17 ENCOUNTER — Ambulatory Visit (INDEPENDENT_AMBULATORY_CARE_PROVIDER_SITE_OTHER): Payer: Medicare Other | Admitting: Family Medicine

## 2014-08-17 ENCOUNTER — Encounter: Payer: Self-pay | Admitting: Family Medicine

## 2014-08-17 VITALS — BP 128/76 | Ht 69.0 in | Wt 158.0 lb

## 2014-08-17 DIAGNOSIS — M5412 Radiculopathy, cervical region: Secondary | ICD-10-CM

## 2014-08-17 DIAGNOSIS — I10 Essential (primary) hypertension: Secondary | ICD-10-CM

## 2014-08-17 DIAGNOSIS — M2550 Pain in unspecified joint: Secondary | ICD-10-CM | POA: Diagnosis not present

## 2014-08-17 DIAGNOSIS — N400 Enlarged prostate without lower urinary tract symptoms: Secondary | ICD-10-CM

## 2014-08-17 DIAGNOSIS — R7309 Other abnormal glucose: Secondary | ICD-10-CM

## 2014-08-17 DIAGNOSIS — R7303 Prediabetes: Secondary | ICD-10-CM

## 2014-08-17 DIAGNOSIS — I491 Atrial premature depolarization: Secondary | ICD-10-CM | POA: Diagnosis not present

## 2014-08-17 LAB — POCT GLYCOSYLATED HEMOGLOBIN (HGB A1C): HEMOGLOBIN A1C: 5.5

## 2014-08-17 MED ORDER — TRAMADOL HCL 50 MG PO TABS: 50.0000 mg | ORAL_TABLET | Freq: Three times a day (TID) | ORAL | Status: DC | PRN

## 2014-08-17 NOTE — Progress Notes (Signed)
   Subjective:    Patient ID: Randy [name], male    DOB: 1929/02/04, 81 y.o.   MRN: [id number]  HPI Patient has concerns about arthritis. When raising arm there is pain & bringing them down there is pain with a tingly sensation. He relates this is minor comes and goes. He states he can chew and swallow food without difficulty Urinating at nighttime urinating during the day without difficulty Denies chest tightness pressure pain shortness breath nausea vomiting diarrhea States his breathing is been doing okay He does relate arthralgias intermittently mainly in the elbows shoulders hands needs  Review of Systems  Constitutional: Negative for activity change, appetite change and fatigue.  HENT: Negative for congestion.   Respiratory: Negative for cough.   Cardiovascular: Negative for chest pain.  Gastrointestinal: Negative for abdominal pain.  Endocrine: Negative for polydipsia and polyphagia.  Neurological: Negative for weakness.  Psychiatric/Behavioral: Negative for confusion.       Objective:   Physical Exam  Constitutional: He appears well-nourished. No distress.  Cardiovascular: Normal rate, regular rhythm and normal heart sounds.   No murmur heard. Pulmonary/Chest: Effort normal and breath sounds normal. No respiratory distress.  Musculoskeletal: He exhibits no edema.  Lymphadenopathy:    He has no cervical adenopathy.  Neurological: He is alert.  Psychiatric: His behavior is normal.  Vitals reviewed.  on physical exam occasional irregular beat heard but for the most part is very regular patient has had EKG in the past which showed premature atrial contractions  He is going through some grief from the loss of his wife but he denies being depressed    Assessment & Plan:  Hyperglycemia prediabetes actually much better control continue current measures Baseline anxiety issues uses Xanax sporadically denies overusing it Arthritis tramadol is helping him he uses it 2-3 times  per day BPH stable medication helps continue current meds Neuropathy intermittent in the arms probably related to cervical stenosis his reflexes and strength are good I don't recommend x-rays or MRI currently Follow-up in June sooner if need be 25 minutes spent with patient Premature atrial contractions stable

## 2014-09-14 DIAGNOSIS — H20011 Primary iridocyclitis, right eye: Secondary | ICD-10-CM | POA: Diagnosis not present

## 2014-10-12 ENCOUNTER — Other Ambulatory Visit: Payer: Self-pay | Admitting: Family Medicine

## 2014-10-12 NOTE — Telephone Encounter (Signed)
May have this +4 refills 

## 2014-10-27 DIAGNOSIS — Z961 Presence of intraocular lens: Secondary | ICD-10-CM | POA: Diagnosis not present

## 2014-10-27 DIAGNOSIS — H532 Diplopia: Secondary | ICD-10-CM | POA: Diagnosis not present

## 2014-11-26 ENCOUNTER — Other Ambulatory Visit: Payer: Self-pay | Admitting: Family Medicine

## 2014-12-09 ENCOUNTER — Ambulatory Visit (INDEPENDENT_AMBULATORY_CARE_PROVIDER_SITE_OTHER): Payer: Medicare Other | Admitting: Family Medicine

## 2014-12-09 ENCOUNTER — Encounter: Payer: Self-pay | Admitting: Family Medicine

## 2014-12-09 VITALS — BP 138/86 | Ht 69.0 in | Wt 158.0 lb

## 2014-12-09 DIAGNOSIS — D692 Other nonthrombocytopenic purpura: Secondary | ICD-10-CM | POA: Diagnosis not present

## 2014-12-09 DIAGNOSIS — F411 Generalized anxiety disorder: Secondary | ICD-10-CM

## 2014-12-09 DIAGNOSIS — I10 Essential (primary) hypertension: Secondary | ICD-10-CM | POA: Diagnosis not present

## 2014-12-09 DIAGNOSIS — M2550 Pain in unspecified joint: Secondary | ICD-10-CM

## 2014-12-09 DIAGNOSIS — N400 Enlarged prostate without lower urinary tract symptoms: Secondary | ICD-10-CM

## 2014-12-09 MED ORDER — TAMSULOSIN HCL 0.4 MG PO CAPS: 0.4000 mg | ORAL_CAPSULE | Freq: Two times a day (BID) | ORAL | Status: DC

## 2014-12-09 MED ORDER — TRAZODONE HCL 50 MG PO TABS: 25.0000 mg | ORAL_TABLET | Freq: Every evening | ORAL | Status: DC | PRN

## 2014-12-09 NOTE — Progress Notes (Addendum)
   Subjective:    Patient ID: Randy [name], male    DOB: 11-30-1928, 81 y.o.   MRN: [id number]  Hypertension This is a chronic problem. The current episode started more than 1 year ago. Pertinent negatives include no chest pain. There are no compliance problems (pt walks around house and stays active. eats pretty healthy).    Pt states he has been having muscle aches and just not feeling well. Stays tired a lot.  Sleeping some during the day dont snore that he knows Denies sleepiness with.  driving  pain in left arm. Starts in deltoid. Radiates into the forearm,Started 1 -2 weeks ago. Pain worse when picking up something or lifting arm. Taking tramadol 50mg  one tid. Uses this bcz of aching in the body He states moods are ok Lost his wife a few months ago He denies being depressed but he does state at times he gets lonely Does get out to go to church Going [facility] the [facility] Family comes by to check Occasionally lonely  Appetite fair 25 minutes spent with patient discussing multiple different issues.  Review of Systems  Constitutional: Negative for activity change, appetite change and fatigue.  HENT: Negative for congestion.   Respiratory: Negative for cough.   Cardiovascular: Negative for chest pain.  Gastrointestinal: Negative for abdominal pain.  Endocrine: Negative for polydipsia and polyphagia.  Neurological: Negative for weakness.  Psychiatric/Behavioral: Negative for confusion.       Objective:   Physical Exam  Constitutional: He appears well-nourished. No distress.  Cardiovascular: Normal rate, regular rhythm and normal heart sounds.   No murmur heard. Pulmonary/Chest: Effort normal and breath sounds normal. No respiratory distress.  Musculoskeletal: He exhibits no edema.  Lymphadenopathy:    He has no cervical adenopathy.  Neurological: He is alert.  Psychiatric: His behavior is normal.  Vitals reviewed.   On physical exam has purpura on 2 spots on his  arm.      Assessment & Plan:  Stress/mild anxiousness-he may use Xanax half tablet in the morning another half around 3 PM in may use another happened early in the evening if necessary a half near bedtime, patient was cautioned that he could have some drowsiness in the morning  We will add trazodone to see if that help with sleep as well as some of his symptoms 50 mg daily at bedtime  HTN decent control continue current measures  BPH under good control with med medicines  Body aches if this persists we will run some additional testing possibly CK possibly sed rate. For now no lab work recheck patient in 4 weeks to see how trazodone is helping out  Senile purpura I don't find any evidence of any underlying issues otherwise. Patient was reassured

## 2014-12-20 ENCOUNTER — Ambulatory Visit (INDEPENDENT_AMBULATORY_CARE_PROVIDER_SITE_OTHER): Payer: Medicare Other | Admitting: Urology

## 2014-12-20 DIAGNOSIS — D090 Carcinoma in situ of bladder: Secondary | ICD-10-CM | POA: Diagnosis not present

## 2014-12-28 ENCOUNTER — Encounter (HOSPITAL_COMMUNITY): Payer: Self-pay | Admitting: *Deleted

## 2014-12-28 ENCOUNTER — Emergency Department (HOSPITAL_COMMUNITY): Payer: Medicare Other

## 2014-12-28 ENCOUNTER — Inpatient Hospital Stay (HOSPITAL_COMMUNITY): Payer: Medicare Other

## 2014-12-28 ENCOUNTER — Inpatient Hospital Stay (HOSPITAL_COMMUNITY)
Admission: EM | Admit: 2014-12-28 | Discharge: 2014-12-30 | DRG: 312 | Disposition: A | Discharge status: Home / Self Care | Payer: Medicare Other | Attending: Internal Medicine | Admitting: Internal Medicine

## 2014-12-28 DIAGNOSIS — R55 Syncope and collapse: Secondary | ICD-10-CM | POA: Diagnosis not present

## 2014-12-28 DIAGNOSIS — Z9079 Acquired absence of other genital organ(s): Secondary | ICD-10-CM | POA: Diagnosis present

## 2014-12-28 DIAGNOSIS — M1990 Unspecified osteoarthritis, unspecified site: Secondary | ICD-10-CM | POA: Diagnosis present

## 2014-12-28 DIAGNOSIS — Z87891 Personal history of nicotine dependence: Secondary | ICD-10-CM

## 2014-12-28 DIAGNOSIS — R0602 Shortness of breath: Secondary | ICD-10-CM | POA: Diagnosis not present

## 2014-12-28 DIAGNOSIS — G8929 Other chronic pain: Secondary | ICD-10-CM | POA: Diagnosis present

## 2014-12-28 DIAGNOSIS — Z9889 Other specified postprocedural states: Secondary | ICD-10-CM

## 2014-12-28 DIAGNOSIS — I639 Cerebral infarction, unspecified: Secondary | ICD-10-CM | POA: Diagnosis not present

## 2014-12-28 DIAGNOSIS — M47812 Spondylosis without myelopathy or radiculopathy, cervical region: Secondary | ICD-10-CM | POA: Diagnosis present

## 2014-12-28 DIAGNOSIS — Z66 Do not resuscitate: Secondary | ICD-10-CM | POA: Diagnosis present

## 2014-12-28 DIAGNOSIS — N400 Enlarged prostate without lower urinary tract symptoms: Secondary | ICD-10-CM | POA: Diagnosis not present

## 2014-12-28 DIAGNOSIS — Z79899 Other long term (current) drug therapy: Secondary | ICD-10-CM

## 2014-12-28 DIAGNOSIS — I10 Essential (primary) hypertension: Secondary | ICD-10-CM | POA: Diagnosis not present

## 2014-12-28 DIAGNOSIS — R1313 Dysphagia, pharyngeal phase: Secondary | ICD-10-CM | POA: Diagnosis present

## 2014-12-28 DIAGNOSIS — J449 Chronic obstructive pulmonary disease, unspecified: Secondary | ICD-10-CM | POA: Diagnosis present

## 2014-12-28 DIAGNOSIS — R3911 Hesitancy of micturition: Secondary | ICD-10-CM | POA: Diagnosis present

## 2014-12-28 DIAGNOSIS — M47892 Other spondylosis, cervical region: Secondary | ICD-10-CM | POA: Diagnosis present

## 2014-12-28 DIAGNOSIS — F411 Generalized anxiety disorder: Secondary | ICD-10-CM | POA: Diagnosis present

## 2014-12-28 DIAGNOSIS — R1310 Dysphagia, unspecified: Secondary | ICD-10-CM

## 2014-12-28 DIAGNOSIS — N320 Bladder-neck obstruction: Secondary | ICD-10-CM | POA: Diagnosis present

## 2014-12-28 DIAGNOSIS — Z9049 Acquired absence of other specified parts of digestive tract: Secondary | ICD-10-CM | POA: Diagnosis present

## 2014-12-28 DIAGNOSIS — G4700 Insomnia, unspecified: Secondary | ICD-10-CM | POA: Diagnosis present

## 2014-12-28 DIAGNOSIS — K224 Dyskinesia of esophagus: Secondary | ICD-10-CM | POA: Diagnosis present

## 2014-12-28 DIAGNOSIS — K219 Gastro-esophageal reflux disease without esophagitis: Secondary | ICD-10-CM | POA: Diagnosis present

## 2014-12-28 DIAGNOSIS — F0390 Unspecified dementia without behavioral disturbance: Secondary | ICD-10-CM | POA: Diagnosis present

## 2014-12-28 DIAGNOSIS — K449 Diaphragmatic hernia without obstruction or gangrene: Secondary | ICD-10-CM | POA: Diagnosis present

## 2014-12-28 DIAGNOSIS — N401 Enlarged prostate with lower urinary tract symptoms: Secondary | ICD-10-CM | POA: Diagnosis present

## 2014-12-28 DIAGNOSIS — I6523 Occlusion and stenosis of bilateral carotid arteries: Secondary | ICD-10-CM | POA: Diagnosis not present

## 2014-12-28 DIAGNOSIS — F40240 Claustrophobia: Secondary | ICD-10-CM | POA: Diagnosis present

## 2014-12-28 DIAGNOSIS — R4182 Altered mental status, unspecified: Secondary | ICD-10-CM | POA: Diagnosis not present

## 2014-12-28 DIAGNOSIS — R269 Unspecified abnormalities of gait and mobility: Secondary | ICD-10-CM | POA: Diagnosis not present

## 2014-12-28 DIAGNOSIS — R569 Unspecified convulsions: Secondary | ICD-10-CM | POA: Diagnosis not present

## 2014-12-28 DIAGNOSIS — R110 Nausea: Secondary | ICD-10-CM | POA: Diagnosis not present

## 2014-12-28 LAB — URINALYSIS, ROUTINE W REFLEX MICROSCOPIC
BILIRUBIN URINE: NEGATIVE
Glucose, UA: NEGATIVE mg/dL
Hgb urine dipstick: NEGATIVE
Ketones, ur: NEGATIVE mg/dL
Leukocytes, UA: NEGATIVE
Nitrite: NEGATIVE
PH: 7 (ref 5.0–8.0)
Protein, ur: NEGATIVE mg/dL
SPECIFIC GRAVITY, URINE: 1.015 (ref 1.005–1.030)
Urobilinogen, UA: 0.2 mg/dL (ref 0.0–1.0)

## 2014-12-28 LAB — BASIC METABOLIC PANEL
Anion gap: 8 (ref 5–15)
BUN: 18 mg/dL (ref 6–20)
CHLORIDE: 99 mmol/L — AB (ref 101–111)
CO2: 31 mmol/L (ref 22–32)
Calcium: 9.2 mg/dL (ref 8.9–10.3)
Creatinine, Ser: 1.22 mg/dL (ref 0.61–1.24)
GFR calc non Af Amer: 53 mL/min — ABNORMAL LOW (ref >60)
GLUCOSE: 132 mg/dL — AB (ref 65–99)
Potassium: 4.6 mmol/L (ref 3.5–5.1)
Sodium: 138 mmol/L (ref 135–145)

## 2014-12-28 LAB — CBC WITH DIFFERENTIAL/PLATELET
BASOS ABS: 0 10*3/uL (ref 0.0–0.1)
BASOS PCT: 0 % (ref 0–1)
EOS ABS: 0.1 10*3/uL (ref 0.0–0.7)
EOS PCT: 1 % (ref 0–5)
HCT: 48.8 % (ref 39.0–52.0)
Hemoglobin: 16.6 g/dL (ref 13.0–17.0)
Lymphocytes Relative: 12 % (ref 12–46)
Lymphs Abs: 0.8 10*3/uL (ref 0.7–4.0)
MCH: 30.5 pg (ref 26.0–34.0)
MCHC: 34 g/dL (ref 30.0–36.0)
MCV: 89.5 fL (ref 78.0–100.0)
Monocytes Absolute: 0.6 10*3/uL (ref 0.1–1.0)
Monocytes Relative: 10 % (ref 3–12)
NEUTROS PCT: 77 % (ref 43–77)
Neutro Abs: 4.7 10*3/uL (ref 1.7–7.7)
PLATELETS: 151 10*3/uL (ref 150–400)
RBC: 5.45 MIL/uL (ref 4.22–5.81)
RDW: 13.2 % (ref 11.5–15.5)
WBC: 6.1 10*3/uL (ref 4.0–10.5)

## 2014-12-28 LAB — TROPONIN I

## 2014-12-28 LAB — APTT: aPTT: 31 seconds (ref 24–37)

## 2014-12-28 LAB — PROTIME-INR
INR: 0.86 (ref 0.00–1.49)
PROTHROMBIN TIME: 12 s (ref 11.6–15.2)

## 2014-12-28 LAB — TSH: TSH: 1.043 u[IU]/mL (ref 0.350–4.500)

## 2014-12-28 MED ORDER — TRAZODONE HCL 50 MG PO TABS: 25.0000 mg | ORAL_TABLET | Freq: Every evening | ORAL | Status: DC | PRN

## 2014-12-28 MED ORDER — ALPRAZOLAM 0.5 MG PO TABS
0.5000 mg | ORAL_TABLET | Freq: Once | ORAL | Status: AC
  Administered 2014-12-28: 0.5 mg via ORAL
  Filled 2014-12-28: qty 1

## 2014-12-28 MED ORDER — PANTOPRAZOLE SODIUM 40 MG PO TBEC
40.0000 mg | DELAYED_RELEASE_TABLET | Freq: Every day | ORAL | Status: DC
  Administered 2014-12-28 – 2014-12-29 (×2): 40 mg via ORAL
  Filled 2014-12-28 (×2): qty 1

## 2014-12-28 MED ORDER — IOHEXOL 350 MG/ML SOLN
100.0000 mL | Freq: Once | INTRAVENOUS | Status: AC | PRN
  Administered 2014-12-28: 75 mL via INTRAVENOUS

## 2014-12-28 MED ORDER — ALPRAZOLAM 0.5 MG PO TABS
0.5000 mg | ORAL_TABLET | Freq: Two times a day (BID) | ORAL | Status: DC | PRN
  Administered 2014-12-29: 0.5 mg via ORAL
  Filled 2014-12-28: qty 1

## 2014-12-28 MED ORDER — STROKE: EARLY STAGES OF RECOVERY BOOK
Freq: Once | Status: AC
  Administered 2014-12-28: 19:00:00
  Filled 2014-12-28: qty 1

## 2014-12-28 MED ORDER — TRAMADOL HCL 50 MG PO TABS
50.0000 mg | ORAL_TABLET | Freq: Three times a day (TID) | ORAL | Status: DC | PRN
  Administered 2014-12-29: 50 mg via ORAL
  Filled 2014-12-28: qty 1

## 2014-12-28 MED ORDER — TAMSULOSIN HCL 0.4 MG PO CAPS
0.4000 mg | ORAL_CAPSULE | Freq: Two times a day (BID) | ORAL | Status: DC
  Administered 2014-12-29: 0.4 mg via ORAL
  Filled 2014-12-28: qty 1

## 2014-12-28 MED ORDER — LORAZEPAM 2 MG/ML IJ SOLN
2.0000 mg | Freq: Once | INTRAMUSCULAR | Status: AC
  Administered 2014-12-28: 2 mg via INTRAVENOUS
  Filled 2014-12-28: qty 1

## 2014-12-28 MED ORDER — HYDRALAZINE HCL 20 MG/ML IJ SOLN
5.0000 mg | INTRAMUSCULAR | Status: DC | PRN
Start: 2014-12-28 — End: 2014-12-30

## 2014-12-28 NOTE — Consult Note (Signed)
Randy A. Merlene Laughter, MD     [email or web address]          Randy [name] is an 81 y.o. male.   ASSESSMENT/PLAN: 1. Recurrent spells of the abdominal discomfort, dizziness and gait impairment of unclear etiology. The episodes seem to happen when the patient is up and about suggestive of orthostatic hypotension. Orthostatic vitals will therefore suggested at least 3 times per Day. 2. Episodes of confusion possibly onset early dementia. Dementia labs have been ordered. An EEG will also be obtained. 3. Hypertension. 4. COPD.  The patient is a 81 year old white male who has had episodes of abdominal discomfort, nausea, dizziness and gait impairment when he wakes up in the morning time. The spells have been ongoing for the last few weeks. They tend to occur in the morning on awakening and lasts about 30 minutes. The patient decides see medical attention yesterday because the spell went on for a few hours. The patient does not report having alteration of consciousness with these spells or confusion. The patient does not report dysarthria and dysphagia with these spells although he has been having dysarthria and dysphasia of unclear etiology. Again, he denies that the spells occur with dysarthria and dysphagia. He denies any alteration of consciousness or confusion with the spells. He has been having some disorientation to time which he happens without these spells. He also seemed to have been having some short-term memory impairment. The patient denies chest pain, headaches, focal numbness or weakness. He had does have some chronic shortness of breath. No GU symptoms are reported. The review of systems otherwise negative.  GENERAL: Pleasant thin man in no acute distress.  HEENT: Supple. Atraumatic normocephalic.   ABDOMEN: soft  EXTREMITIES: No edema   BACK: Normal.  SKIN: Normal by inspection.    MENTAL STATUS: Alert and oriented. Speech, language and cognition are generally  intact. Judgment and insight normal.   CRANIAL NERVES: Pupils are equal, round and reactive to light and accommodation; extra ocular movements are full, there is no significant nystagmus; visual fields are full; upper and lower facial muscles are normal in strength and symmetric, there is no flattening of the nasolabial folds; tongue is midline; uvula is midline; shoulder elevation is normal.  MOTOR: Normal tone, bulk and strength; no pronator drift.  COORDINATION: Left finger to nose is normal, right finger to nose is normal, No rest tremor; no intention tremor; no postural tremor; no bradykinesia.  REFLEXES: Deep tendon reflexes are symmetrical and normal. Babinski reflexes are flexor bilaterally.   SENSATION: Normal to light touch.      THE HEAD CT SCAN IS REVIEWED IN PERSON. THERE IS MILD GLOBAL ATROPHY. OTHERWISE NOTHING ACUTE IS SEEN.    Blood pressure 149/86, pulse 97, temperature 98.1 F (36.7 C), temperature source Oral, resp. rate 19, height '5\' 9"'  (1.753 m), weight 71.804 kg (158 lb 4.8 oz), SpO2 99 %.  Past Medical History  Diagnosis Date  . History of BPH   . DJD (degenerative joint disease)     cervical  . GERD (gastroesophageal reflux disease)     takes prilosec intermittently  . Anxiety     claustraphobia  . BNC (bladder neck contracture)   . Dysuria OCCASIONAL  . Urinary hesitancy   . Cervical spondylosis   . Hiatal hernia   . COPD (chronic obstructive pulmonary disease)   . Impaired fasting glucose   . Claustrophobia   . Hypertension   . Complication of anesthesia     claustraphobia  Past Surgical History  Procedure Laterality Date  . Retinal detachment surgery  1988  . Cardiovascular stress test  11-08-2009  DR ROTHBART    LOW RISK MYOVIEW/ NO ISCHEMIA/ LVEF 64%  . Transurethral resection of bladder tumor  01/06/2012    Procedure: TRANSURETHRAL RESECTION OF BLADDER TUMOR (TURBT);  Surgeon: Franchot Gallo, MD;  Location: [facility];  Service: Urology;  Laterality: N/A;  . Transurethral resection of prostate    . Laparoscopic repair large hiatal hernia w/ mesh and nissen fundoplication  [id number]  . Cholecystectomy  1984    OPEN  . Cataract extraction w/ intraocular lens  implant, bilateral    . Transurethral resection of bladder neck  05/01/2012    Procedure: TRANSURETHRAL RESECTION OF BLADDER NECK;  Surgeon: Franchot Gallo, MD;  Location: [facility];  Service: Urology;  Laterality: N/A;  [place]   . Collapsed lung  1965  . Transurethral resection of prostate    . Prostate biopsy N/A 11/17/2012    Procedure: BIOPSY TRANSRECTAL ULTRASONIC PROSTATE (TUBP);  Surgeon: Franchot Gallo, MD;  Location: [facility];  Service: Urology;  Laterality: N/A;  45 MINS 1. TRANSRECTAL ULTRASOUND OF PROSTATE WITH BIOPSY -will need ultrasound tech 2.CYSTOSCOPY, BLADDER BIOPSY - need resectoscope- not Gyrus [id number] Medicare -[id number] BCBS - [id number]  . Cystoscopy with biopsy N/A 11/17/2012    Procedure: CYSTOSCOPY WITH BIOPSY;  Surgeon: Franchot Gallo, MD;  Location: [facility];  Service: Urology;  Laterality: N/A;  . Reposition of lens Right 03/31/2014    Procedure: REPOSITIONING OF INTRAOCULAR LENS IMPLANT RIGHT EYE;  Surgeon: Tonny Branch, MD;  Location: [facility];  Service: Ophthalmology;  Laterality: Right;    Family History  Problem Relation Age of Onset  . Stroke Mother   . Kidney failure Father   . Heart disease Brother     Social History:  reports that he quit smoking about 28 years ago. He has never used smokeless tobacco. He reports that he does not drink alcohol or use illicit drugs.  Allergies: No Known Allergies  Medications: Prior to Admission medications   Medication Sig Start Date End Date Taking? Authorizing Provider  ALPRAZolam (XANAX) 0.5 MG tablet TAKE 1/2 TO 1 TABLET TWICE DAILY. 10/13/14  Yes Kathyrn Drown, MD  amLODipine (NORVASC)  2.5 MG tablet Take 1 tablet (2.5 mg total) by mouth daily. 07/08/14  Yes Kathyrn Drown, MD  fluticasone (FLONASE) 50 MCG/ACT nasal spray USE 2 SPRAYS IN NOSTRIL DAILY 05/23/14  Yes Kathyrn Drown, MD  tamsulosin (FLOMAX) 0.4 MG CAPS capsule Take 1 capsule (0.4 mg total) by mouth 2 (two) times daily. 12/09/14  Yes Kathyrn Drown, MD  traMADol (ULTRAM) 50 MG tablet Take 1 tablet (50 mg total) by mouth 3 (three) times daily as needed. Patient taking differently: Take 50 mg by mouth 3 (three) times daily as needed for moderate pain.  08/17/14  Yes Kathyrn Drown, MD  traZODone (DESYREL) 50 MG tablet Take 0.5-1 tablets (25-50 mg total) by mouth at bedtime as needed for sleep. 12/09/14  Yes Kathyrn Drown, MD    Scheduled Meds: .  stroke: mapping our early stages of recovery book   Does not apply Once  . pantoprazole  40 mg Oral Daily  . tamsulosin  0.4 mg Oral BID   Continuous Infusions:  PRN Meds:.ALPRAZolam, hydrALAZINE, traMADol, traZODone     Results for orders placed or performed during the hospital encounter of 12/28/14 (  from the past 48 hour(s))  CBC with Differential     Status: None   Collection Time: 12/28/14 11:00 AM  Result Value Ref Range   WBC 6.1 4.0 - 10.5 K/uL   RBC 5.45 4.22 - 5.81 MIL/uL   Hemoglobin 16.6 13.0 - 17.0 g/dL   HCT 48.8 39.0 - 52.0 %   MCV 89.5 78.0 - 100.0 fL   MCH 30.5 26.0 - 34.0 pg   MCHC 34.0 30.0 - 36.0 g/dL   RDW 13.2 11.5 - 15.5 %   Platelets 151 150 - 400 K/uL   Neutrophils Relative % 77 43 - 77 %   Neutro Abs 4.7 1.7 - 7.7 K/uL   Lymphocytes Relative 12 12 - 46 %   Lymphs Abs 0.8 0.7 - 4.0 K/uL   Monocytes Relative 10 3 - 12 %   Monocytes Absolute 0.6 0.1 - 1.0 K/uL   Eosinophils Relative 1 0 - 5 %   Eosinophils Absolute 0.1 0.0 - 0.7 K/uL   Basophils Relative 0 0 - 1 %   Basophils Absolute 0.0 0.0 - 0.1 K/uL  Basic metabolic panel     Status: Abnormal   Collection Time: 12/28/14 11:00 AM  Result Value Ref Range   Sodium 138 135 - 145  mmol/L   Potassium 4.6 3.5 - 5.1 mmol/L   Chloride 99 (L) 101 - 111 mmol/L   CO2 31 22 - 32 mmol/L   Glucose, Bld 132 (H) 65 - 99 mg/dL   BUN 18 6 - 20 mg/dL   Creatinine, Ser 1.22 0.61 - 1.24 mg/dL   Calcium 9.2 8.9 - 10.3 mg/dL   GFR calc non Af Amer 53 (L) >60 mL/min   GFR calc Af Amer >60 >60 mL/min    Comment: (NOTE) The eGFR has been calculated using the CKD EPI equation. This calculation has not been validated in all clinical situations. eGFR's persistently <60 mL/min signify possible Chronic Kidney Disease.    Anion gap 8 5 - 15  Troponin I     Status: None   Collection Time: 12/28/14 11:00 AM  Result Value Ref Range   Troponin I <0.03 <0.031 ng/mL    Comment:        NO INDICATION OF MYOCARDIAL INJURY.   Protime-INR     Status: None   Collection Time: 12/28/14 11:00 AM  Result Value Ref Range   Prothrombin Time 12.0 11.6 - 15.2 seconds   INR 0.86 0.00 - 1.49  APTT     Status: None   Collection Time: 12/28/14 11:00 AM  Result Value Ref Range   aPTT 31 24 - 37 seconds  Urinalysis, Routine w reflex microscopic (not at [facility])     Status: None   Collection Time: 12/28/14 11:21 AM  Result Value Ref Range   Color, Urine YELLOW YELLOW   APPearance CLEAR CLEAR   Specific Gravity, Urine 1.015 1.005 - 1.030   pH 7.0 5.0 - 8.0   Glucose, UA NEGATIVE NEGATIVE mg/dL   Hgb urine dipstick NEGATIVE NEGATIVE   Bilirubin Urine NEGATIVE NEGATIVE   Ketones, ur NEGATIVE NEGATIVE mg/dL   Protein, ur NEGATIVE NEGATIVE mg/dL   Urobilinogen, UA 0.2 0.0 - 1.0 mg/dL   Nitrite NEGATIVE NEGATIVE   Leukocytes, UA NEGATIVE NEGATIVE    Comment: MICROSCOPIC NOT DONE ON URINES WITH NEGATIVE PROTEIN, BLOOD, LEUKOCYTES, NITRITE, OR GLUCOSE <1000 mg/dL.    Studies/Results:  HEAD AND NECK CTA Calvarium and skull base: No fracture or destructive lesion. Mastoids and  middle ears are grossly clear.  Paranasal sinuses: Imaged portions are clear.  Orbits: Previous ocular surgery with  low-attenuation collection superior to the RIGHT globe probably representing air. This appearance is similar to 2012.  Brain: No evidence of acute abnormality, including acute infarct, hemorrhage, hydrocephalus, or mass lesion. Mild atrophy. Slight hypoattenuation of white matter consistent with chronic microvascular ischemic change. There is no significant change from CT head 2012.  CTA NECK  Aortic arch: Standard branching. Imaged portion shows no evidence of aneurysm or dissection. No significant stenosis of the major arch vessel origins.  Right carotid system: Minor calcific disease at the bifurcation. No evidence of dissection, stenosis (50% or greater) or occlusion.  Left carotid system: Minor calcific disease at the bifurcation. No evidence of dissection, stenosis (50% or greater) or occlusion.  Vertebral arteries: Codominant. No evidence of dissection, stenosis (50% or greater) or occlusion.  Nonvascular soft tissues: Biapical pleural thickening and air cyst formation. No pulmonary nodule. Cervical spondylosis without worrisome osseous lesion.  CTA HEAD  Anterior circulation: No significant stenosis, proximal occlusion, aneurysm, or vascular malformation.  Posterior circulation: No significant stenosis, proximal occlusion, aneurysm, or vascular malformation.  Venous sinuses: As permitted by contrast timing, patent.  Anatomic variants: Fetal RIGHT PCA.  Delayed phase: No abnormal intracranial enhancement.  IMPRESSION: No intracranial or extracranial flow reducing lesion.  Within limits for evaluation on CT, no visible acute infarction. No intracranial hemorrhage.  No evidence for vertebrobasilar insufficiency.   Javaya [place] A. Merlene [name], M.D.  Diplomate, Tax adviser of Psychiatry and Neurology ( Neurology). 12/28/2014, 6:49 PM

## 2014-12-28 NOTE — ED Notes (Signed)
Pt states that felt like he was going to pass out earlier today then had some SOB but denies at present, with mild nausea, denies any pain or CP

## 2014-12-28 NOTE — H&P (Signed)
[facility] History and Physical  Randy [name] GLO:[id number] DOB: [birth year] DOA: 12/28/2014  Referring physician: Dr Lacinda Axon - [facility] PCP: Sallee Lange, MD   Chief Complaint: near syncope  HPI: Randy [name] is a 81 y.o. male  Patient reports a two-week history of intermittent episodes of body aches, dizziness, lightheadedness. These episodes typically occur in the morning when patient awakes and last for approximately 30 minutes. Resolve with rest. This morning patient woke with his usual symptoms but they were significantly worse, did not resolve spontaneously, and were associated with new symptoms of shortness of breath, and nausea. Patient states he felt unstable on his feet. Denies any vertigo. Patient has not tried anything for the symptoms. Patient also reports a two-week history of intermittent dysphagia for solids and liquids and occasional sensation of fullness in his throat. Patient reports approximately 3-4 episodes of dysphagia during this period of time. Denies any hemoptysis, melena, unintentional weight loss, fevers, night sweats.   Review of Systems:  Constitutional:  No weight loss, night sweats, Fevers, chills, fatigue.  HEENT:  No headaches, Tooth/dental problems,Sore throat,  No sneezing, itching, ear ache, nasal congestion, post nasal drip,  Cardio-vascular:  No chest pain, Orthopnea, PND, swelling in lower extremities, anasarca, dizziness, palpitations  GI:  No heartburn, indigestion, abdominal pain, nausea, vomiting, diarrhea, change in bowel habits, loss of appetite  Resp:   No shortness of breath with exertion or at rest. No excess mucus, no productive cough, No non-productive cough, No coughing up of blood.No change in color of mucus.No wheezing.No chest wall deformity  Skin:  no rash or lesions.  GU:  no dysuria, change in color of urine, no urgency or frequency. No flank pain.  Musculoskeletal:   No joint pain or swelling. No decreased range of motion. No  back pain.  Psych:  No change in mood or affect. No depression or anxiety. No memory loss.   Past Medical History  Diagnosis Date  . History of BPH   . DJD (degenerative joint disease)     cervical  . GERD (gastroesophageal reflux disease)     takes prilosec intermittently  . Anxiety     claustraphobia  . Complication of anesthesia     claustraphobia  . BNC (bladder neck contracture)   . Dysuria OCCASIONAL  . Urinary hesitancy   . Cervical spondylosis   . Hiatal hernia   . COPD (chronic obstructive pulmonary disease)   . Impaired fasting glucose   . Claustrophobia   . Hypertension    Past Surgical History  Procedure Laterality Date  . Retinal detachment surgery  1988  . Cardiovascular stress test  11-08-2009  DR ROTHBART    LOW RISK MYOVIEW/ NO ISCHEMIA/ LVEF 64%  . Transurethral resection of bladder tumor  01/06/2012    Procedure: TRANSURETHRAL RESECTION OF BLADDER TUMOR (TURBT);  Surgeon: Franchot Gallo, MD;  Location: [facility];  Service: Urology;  Laterality: N/A;  . Transurethral resection of prostate    . Laparoscopic repair large hiatal hernia w/ mesh and nissen fundoplication  [id number]  . Cholecystectomy  1984    OPEN  . Cataract extraction w/ intraocular lens  implant, bilateral    . Transurethral resection of bladder neck  05/01/2012    Procedure: TRANSURETHRAL RESECTION OF BLADDER NECK;  Surgeon: Franchot Gallo, MD;  Location: [facility];  Service: Urology;  Laterality: N/A;  [place]   . Collapsed lung  1965  . Transurethral resection of  prostate    . Prostate biopsy N/A 11/17/2012    Procedure: BIOPSY TRANSRECTAL ULTRASONIC PROSTATE (TUBP);  Surgeon: Franchot Gallo, MD;  Location: [facility];  Service: Urology;  Laterality: N/A;  45 MINS 1. TRANSRECTAL ULTRASOUND OF PROSTATE WITH BIOPSY -will need ultrasound tech 2.CYSTOSCOPY, BLADDER BIOPSY - need resectoscope- not  Gyrus [id number] Medicare -[id number] BCBS - [id number]  . Cystoscopy with biopsy N/A 11/17/2012    Procedure: CYSTOSCOPY WITH BIOPSY;  Surgeon: Franchot Gallo, MD;  Location: [facility];  Service: Urology;  Laterality: N/A;  . Reposition of lens Right 03/31/2014    Procedure: REPOSITIONING OF INTRAOCULAR LENS IMPLANT RIGHT EYE;  Surgeon: Tonny Branch, MD;  Location: [facility];  Service: Ophthalmology;  Laterality: Right;   Social History:  reports that he quit smoking about 28 years ago. He has never used smokeless tobacco. He reports that he does not drink alcohol or use illicit drugs.  No Known Allergies  Family History  Problem Relation Age of Onset  . Stroke Mother   . Kidney failure Father   . Heart disease Brother      Prior to Admission medications   Medication Sig Start Date End Date Taking? Authorizing Provider  ALPRAZolam (XANAX) 0.5 MG tablet TAKE 1/2 TO 1 TABLET TWICE DAILY. 10/13/14  Yes Kathyrn Drown, MD  amLODipine (NORVASC) 2.5 MG tablet Take 1 tablet (2.5 mg total) by mouth daily. 07/08/14  Yes Kathyrn Drown, MD  fluticasone (FLONASE) 50 MCG/ACT nasal spray USE 2 SPRAYS IN NOSTRIL DAILY 05/23/14  Yes Kathyrn Drown, MD  tamsulosin (FLOMAX) 0.4 MG CAPS capsule Take 1 capsule (0.4 mg total) by mouth 2 (two) times daily. 12/09/14  Yes Kathyrn Drown, MD  traMADol (ULTRAM) 50 MG tablet Take 1 tablet (50 mg total) by mouth 3 (three) times daily as needed. Patient taking differently: Take 50 mg by mouth 3 (three) times daily as needed for moderate pain.  08/17/14  Yes Kathyrn Drown, MD  traZODone (DESYREL) 50 MG tablet Take 0.5-1 tablets (25-50 mg total) by mouth at bedtime as needed for sleep. 12/09/14  Yes Kathyrn Drown, MD   Physical Exam: Filed Vitals:   12/28/14 1400 12/28/14 1430 12/28/14 1500 12/28/14 1530  BP: 134/98 143/80 138/101 148/102  Pulse: 96 99 94 96  Temp:      TempSrc:      Resp: 16 17 17 14   Height:      Weight:      SpO2: 92% 94% 92% 94%    Wt Readings  from Last 3 Encounters:  12/28/14 71.668 kg (158 lb)  12/09/14 71.668 kg (158 lb)  08/17/14 71.668 kg (158 lb)    General:  Appears calm and comfortable Eyes:  PERRL, normal lids, irises & conjunctiva ENT:  grossly normal hearing, lips & tongue Neck:  no LAD, masses or thyromegaly Cardiovascular:  RRR, faint heart sounds, II/VI systolic murmur. No LE edema. Respiratory:  CTA bilaterally, no w/r/r. Normal respiratory effort. Abdomen:  soft, ntnd Skin:  no rash or induration seen on limited exam Musculoskeletal:  grossly normal tone BUE/BLE Psychiatric:  grossly normal mood and affect, speech fluent and appropriate Neurologic: Cranial nerves II through XII intact, no dysmetria, moves all extremities and coordinated fashion, Romberg negative          Labs on Admission:  Basic Metabolic Panel:  Recent Labs Lab 12/28/14 1100  NA 138  K 4.6  CL 99*  CO2 31  GLUCOSE 132*  BUN 18  CREATININE  1.22  CALCIUM 9.2   Liver Function Tests: No results for input(s): AST, ALT, ALKPHOS, BILITOT, PROT, ALBUMIN in the last 168 hours. No results for input(s): LIPASE, AMYLASE in the last 168 hours. No results for input(s): AMMONIA in the last 168 hours. CBC:  Recent Labs Lab 12/28/14 1100  WBC 6.1  NEUTROABS 4.7  HGB 16.6  HCT 48.8  MCV 89.5  PLT 151   Cardiac Enzymes:  Recent Labs Lab 12/28/14 1100  TROPONINI <0.03    BNP (last 3 results) No results for input(s): BNP in the last 8760 hours.  ProBNP (last 3 results) No results for input(s): PROBNP in the last 8760 hours.  CBG: No results for input(s): GLUCAP in the last 168 hours.  Radiological Exams on Admission: Ct Head Wo Contrast  12/28/2014   CLINICAL DATA:  Nausea and near syncope  EXAM: CT HEAD WITHOUT CONTRAST  TECHNIQUE: Contiguous axial images were obtained from the base of the skull through the vertex without intravenous contrast.  COMPARISON:  March 22, 2011  FINDINGS: Motion artifact makes this study  somewhat less than optimal. There is mild diffuse atrophy which is stable. There is no intracranial mass, hemorrhage, extra-axial fluid collection, or midline shift. Gray-white compartments are normal. No acute infarct is evident. The bony calvarium appears intact. The mastoid air cells are clear. There is a small benign osteoma in the right ethmoid sinus region. There is stable decreased attenuation in the superior right globe, stable.  IMPRESSION: Mild diffuse atrophy. No intracranial mass, hemorrhage, or focal gray - white compartment lesions/acute appearing infarct. Focal area of decreased attenuation in the superior right globe is a stable finding.   Electronically Signed   By: Lowella Grip III M.D.   On: 12/28/2014 11:43   Dg Chest Portable 1 View  12/28/2014   CLINICAL DATA:  Shortness of Breath  EXAM: PORTABLE CHEST - 1 VIEW  COMPARISON:  January 06, 2012  FINDINGS: There is stable elevation of the right hemidiaphragm. There is no edema or consolidation. The heart size is upper normal with pulmonary vascularity within normal limits. No adenopathy. There are surgical clips in the right upper quadrant region. No bone lesions.  IMPRESSION: Stable elevation right hemidiaphragm.  No edema or consolidation.   Electronically Signed   By: Lowella Grip III M.D.   On: 12/28/2014 11:27     Assessment/Plan Principal Problem:   Stroke Active Problems:   GERD   Osteoarthritis   BPH (benign prostatic hyperplasia)   Essential hypertension, benign   Anxiety state   Dysphagia   Insomnia   Claustrophobia   Stroke: CT noting White compartment acute infarct. Outside window for treatment as last nml prior to pt going to bed last night. Still w/ mild symptoms. Did not tolerate MRI due to claustrophobia. Dr Merlene Laughter notified and will round on pt. - Tele - neuro checks - permissive HTN - bedside swallow - PT/OT - CTA head and neck - MRI - Echo - HLD, A1c  Dysphagia: intermittent and to solids  and liquids. Possibly related to stroke vs esophageal dysmotility vs GERD vs malignancy vs other. - Stroke workup as above - Bedside swallow - Start PPI - consider GI eval.   BPH status post prostatectomy: Patient with residual difficulty with urination - Continue Flomax  Anxiety: -  continue Xanax   Chronic MSK pain: - continue tramadol  Insomnia: - Continue trazodone  HTN: Normotensive. Permit permissive hypertension due to stroke - Hold home Norvasc  -Hydralazine when  necessary SBP greater than 180   Code Status: FULL: DVT Prophylaxis: Hep Family Communication: Daughter Disposition Plan: Pending improvement and workup  MERRELL, DAVID J, MD Family Medicine [facility] [email or web address] Password [email or web address]

## 2014-12-28 NOTE — ED Provider Notes (Signed)
CSN: [id number]     Arrival date & time 12/28/14  1044 History  This chart was scribed for Nat Christen, MD by Terressa Koyanagi, ED Scribe. This patient was seen in room APA10/APA10 and the patient's care was started at 11:02 AM.   Chief Complaint  Patient presents with  . Near Syncope   The history is provided by the patient and a relative.   PCP: Sallee Lange, MD HPI Comments: Randy [name] is a 81 y.o. male, accompanied by his daughter, with PMHx noted below including COPD, episodes of near syncope, who presents to the Emergency Department complaining of an episode of near syncope with associated lightheadedness, SOB and nausea onset this morning. Pt also complains of intermittent trouble swallowing solids onset recently; pt states it feels like "food gets stuck in my throat" when ingesting solids. Pt denies dizziness, gait problems due to lightheadedness (noting he was able to walk around his house this morning without stumbling or falling; pt's daughter, however, states pt was unsteady on his feet this morning), chest pain, urinary Sx.  Past Medical History  Diagnosis Date  . History of BPH   . DJD (degenerative joint disease)     cervical  . GERD (gastroesophageal reflux disease)     takes prilosec intermittently  . Anxiety     claustraphobia  . Complication of anesthesia     claustraphobia  . BNC (bladder neck contracture)   . Dysuria OCCASIONAL  . Urinary hesitancy   . Cervical spondylosis   . Hiatal hernia   . COPD (chronic obstructive pulmonary disease)   . Impaired fasting glucose   . Claustrophobia   . Hypertension    Past Surgical History  Procedure Laterality Date  . Retinal detachment surgery  1988  . Cardiovascular stress test  11-08-2009  DR ROTHBART    LOW RISK MYOVIEW/ NO ISCHEMIA/ LVEF 64%  . Transurethral resection of bladder tumor  01/06/2012    Procedure: TRANSURETHRAL RESECTION OF BLADDER TUMOR (TURBT);  Surgeon: Franchot Gallo, MD;  Location: [facility];  Service: Urology;  Laterality: N/A;  . Transurethral resection of prostate    . Laparoscopic repair large hiatal hernia w/ mesh and nissen fundoplication  [id number]  . Cholecystectomy  1984    OPEN  . Cataract extraction w/ intraocular lens  implant, bilateral    . Transurethral resection of bladder neck  05/01/2012    Procedure: TRANSURETHRAL RESECTION OF BLADDER NECK;  Surgeon: Franchot Gallo, MD;  Location: [facility];  Service: Urology;  Laterality: N/A;  [place]   . Collapsed lung  1965  . Transurethral resection of prostate    . Prostate biopsy N/A 11/17/2012    Procedure: BIOPSY TRANSRECTAL ULTRASONIC PROSTATE (TUBP);  Surgeon: Franchot Gallo, MD;  Location: [facility];  Service: Urology;  Laterality: N/A;  45 MINS 1. TRANSRECTAL ULTRASOUND OF PROSTATE WITH BIOPSY -will need ultrasound tech 2.CYSTOSCOPY, BLADDER BIOPSY - need resectoscope- not [employer] [id number] Medicare -[id number] BCBS - [id number]  . Cystoscopy with biopsy N/A 11/17/2012    Procedure: CYSTOSCOPY WITH BIOPSY;  Surgeon: Franchot Gallo, MD;  Location: [facility];  Service: Urology;  Laterality: N/A;  . Reposition of lens Right 03/31/2014    Procedure: REPOSITIONING OF INTRAOCULAR LENS IMPLANT RIGHT EYE;  Surgeon: Tonny Branch, MD;  Location: [facility];  Service: Ophthalmology;  Laterality: Right;   Family History  Problem Relation Age of Onset  . Stroke Mother   . Kidney failure Father   .  Heart disease Brother    History  Substance Use Topics  . Smoking status: Former Smoker -- 70 years    Quit date: 12/26/1986  . Smokeless tobacco: Never Used  . Alcohol Use: No    Review of Systems  Constitutional: Negative for fever and chills.  HENT: Positive for trouble swallowing.   Respiratory: Positive for shortness of breath.   Cardiovascular: Negative for chest pain.  Gastrointestinal: Positive for nausea. Negative for vomiting.   Genitourinary: Negative for dysuria, urgency, frequency, hematuria, decreased urine volume and difficulty urinating.  Neurological: Positive for syncope (near syncope) and light-headedness. Negative for dizziness.   A complete 10 system review of systems was obtained and all systems are negative except as noted in the HPI and PMH.     Allergies  Review of patient's allergies indicates no known allergies.  Home Medications   Prior to Admission medications   Medication Sig Start Date End Date Taking? Authorizing Provider  ALPRAZolam (XANAX) 0.5 MG tablet TAKE 1/2 TO 1 TABLET TWICE DAILY. 10/13/14  Yes Kathyrn Drown, MD  amLODipine (NORVASC) 2.5 MG tablet Take 1 tablet (2.5 mg total) by mouth daily. 07/08/14  Yes Kathyrn Drown, MD  fluticasone (FLONASE) 50 MCG/ACT nasal spray USE 2 SPRAYS IN NOSTRIL DAILY 05/23/14  Yes Kathyrn Drown, MD  tamsulosin (FLOMAX) 0.4 MG CAPS capsule Take 1 capsule (0.4 mg total) by mouth 2 (two) times daily. 12/09/14  Yes Kathyrn Drown, MD  traMADol (ULTRAM) 50 MG tablet Take 1 tablet (50 mg total) by mouth 3 (three) times daily as needed. Patient taking differently: Take 50 mg by mouth 3 (three) times daily as needed for moderate pain.  08/17/14  Yes Kathyrn Drown, MD  traZODone (DESYREL) 50 MG tablet Take 0.5-1 tablets (25-50 mg total) by mouth at bedtime as needed for sleep. 12/09/14  Yes Kathyrn Drown, MD   Triage Vitals: BP 158/94 mmHg  Pulse 101  Temp(Src) 97.9 F (36.6 C) (Oral)  Resp 14  Ht 5\' 9"  (1.753 m)  Wt 158 lb (71.668 kg)  BMI 23.32 kg/m2  SpO2 96% Physical Exam  Constitutional: He is oriented to person, place, and time. He appears well-developed and well-nourished.  HENT:  Head: Normocephalic and atraumatic.  Eyes: Conjunctivae and EOM are normal. Pupils are equal, round, and reactive to light.  Neck: Normal range of motion. Neck supple.  Cardiovascular: Normal rate and regular rhythm.   Pulmonary/Chest: Effort normal and breath sounds  normal.  Abdominal: Soft. Bowel sounds are normal.  Musculoskeletal: Normal range of motion.  Neurological: He is alert and oriented to person, place, and time.  Moving all extremities well.   Skin: Skin is warm and dry.  Psychiatric: He has a normal mood and affect. His behavior is normal.  Nursing note and vitals reviewed.   ED Course  Procedures (including critical care time) DIAGNOSTIC STUDIES: Oxygen Saturation is 96% on RA, nl by my interpretation.    COORDINATION OF CARE: 11:08 AM-Discussed treatment plan which includes imaging with pt at bedside and pt agreed to plan.  12:36 PM: Recheck. Pt resting and states he is feeling a bit better. Discussed MRI with patient.  Labs Review Labs Reviewed  BASIC METABOLIC PANEL - Abnormal; Notable for the following:    Chloride 99 (*)    Glucose, Bld 132 (*)    GFR calc non Af Amer 53 (*)    All other components within normal limits  CBC WITH DIFFERENTIAL/PLATELET  TROPONIN I  PROTIME-INR  APTT  URINALYSIS, ROUTINE W REFLEX MICROSCOPIC (NOT AT [facility])    Imaging Review Ct Head Wo Contrast  12/28/2014   CLINICAL DATA:  Nausea and near syncope  EXAM: CT HEAD WITHOUT CONTRAST  TECHNIQUE: Contiguous axial images were obtained from the base of the skull through the vertex without intravenous contrast.  COMPARISON:  March 22, 2011  FINDINGS: Motion artifact makes this study somewhat less than optimal. There is mild diffuse atrophy which is stable. There is no intracranial mass, hemorrhage, extra-axial fluid collection, or midline shift. Gray-white compartments are normal. No acute infarct is evident. The bony calvarium appears intact. The mastoid air cells are clear. There is a small benign osteoma in the right ethmoid sinus region. There is stable decreased attenuation in the superior right globe, stable.  IMPRESSION: Mild diffuse atrophy. No intracranial mass, hemorrhage, or focal gray - white compartment lesions/acute appearing infarct.  Focal area of decreased attenuation in the superior right globe is a stable finding.   Electronically Signed   By: Lowella Grip III M.D.   On: 12/28/2014 11:43   Dg Chest Portable 1 View  12/28/2014   CLINICAL DATA:  Shortness of Breath  EXAM: PORTABLE CHEST - 1 VIEW  COMPARISON:  January 06, 2012  FINDINGS: There is stable elevation of the right hemidiaphragm. There is no edema or consolidation. The heart size is upper normal with pulmonary vascularity within normal limits. No adenopathy. There are surgical clips in the right upper quadrant region. No bone lesions.  IMPRESSION: Stable elevation right hemidiaphragm.  No edema or consolidation.   Electronically Signed   By: Lowella Grip III M.D.   On: 12/28/2014 11:27     EKG Interpretation   Date/Time:  Wednesday December 28 2014 11:03:00 EDT Ventricular Rate:  104 PR Interval:  196 QRS Duration: 80 QT Interval:  371 QTC Calculation: 488 R Axis:   49 Text Interpretation:  Sinus arrhythmia Ventricular premature complex  Anterior infarct, old Borderline T abnormalities, inferior leads Baseline  wander in lead(s) V2 V3 V4 V5 Confirmed by Kelli Egolf  MD, Fatimata Talsma ([id number]) on  12/28/2014 11:20:05 AM      MDM   Final diagnoses:  Near syncope    Patient presents with near syncopal spells. CT scan shows questionable acute infarct versus white compartment lesions. Will admit to general medicine.  I personally performed the services described in this documentation, which was scribed in my presence. The recorded information has been reviewed and is accurate.    Nat Christen, MD 12/28/14 [phone]

## 2014-12-28 NOTE — ED Notes (Signed)
MD at bedside. 

## 2014-12-28 NOTE — Progress Notes (Signed)
Pt states he desires to be a DNR. DNR status explained to both pt and daughter. Hospitalist notified of pt's request.

## 2014-12-29 ENCOUNTER — Inpatient Hospital Stay (HOSPITAL_COMMUNITY): Payer: Medicare Other

## 2014-12-29 DIAGNOSIS — R55 Syncope and collapse: Principal | ICD-10-CM

## 2014-12-29 DIAGNOSIS — I10 Essential (primary) hypertension: Secondary | ICD-10-CM

## 2014-12-29 DIAGNOSIS — N400 Enlarged prostate without lower urinary tract symptoms: Secondary | ICD-10-CM

## 2014-12-29 DIAGNOSIS — I639 Cerebral infarction, unspecified: Secondary | ICD-10-CM

## 2014-12-29 DIAGNOSIS — F411 Generalized anxiety disorder: Secondary | ICD-10-CM

## 2014-12-29 LAB — LIPID PANEL
Cholesterol: 145 mg/dL (ref 0–200)
HDL: 47 mg/dL (ref >40)
LDL Cholesterol: 84 mg/dL (ref 0–99)
TRIGLYCERIDES: 72 mg/dL (ref <150)
Total CHOL/HDL Ratio: 3.1 RATIO
VLDL: 14 mg/dL (ref 0–40)

## 2014-12-29 LAB — VITAMIN B12: Vitamin B-12: 472 pg/mL (ref 180–914)

## 2014-12-29 MED ORDER — ENOXAPARIN SODIUM 40 MG/0.4ML ~~LOC~~ SOLN
40.0000 mg | SUBCUTANEOUS | Status: DC
  Administered 2014-12-29: 40 mg via SUBCUTANEOUS
  Filled 2014-12-29: qty 0.4

## 2014-12-29 MED ORDER — PANTOPRAZOLE SODIUM 40 MG PO TBEC
40.0000 mg | DELAYED_RELEASE_TABLET | Freq: Every day | ORAL | Status: DC
  Administered 2014-12-30: 40 mg via ORAL
  Filled 2014-12-29: qty 1

## 2014-12-29 MED ORDER — TRAZODONE HCL 50 MG PO TABS
25.0000 mg | ORAL_TABLET | Freq: Every evening | ORAL | Status: DC | PRN
  Administered 2014-12-29: 25 mg via ORAL
  Filled 2014-12-29: qty 1

## 2014-12-29 MED ORDER — TRAMADOL HCL 50 MG PO TABS
50.0000 mg | ORAL_TABLET | Freq: Three times a day (TID) | ORAL | Status: DC | PRN
  Administered 2014-12-29: 50 mg via ORAL
  Filled 2014-12-29: qty 1

## 2014-12-29 MED ORDER — SODIUM CHLORIDE 0.9 % IV SOLN
INTRAVENOUS | Status: DC
  Administered 2014-12-29 – 2014-12-30 (×2): via INTRAVENOUS

## 2014-12-29 MED ORDER — ALPRAZOLAM 0.5 MG PO TABS
0.5000 mg | ORAL_TABLET | Freq: Two times a day (BID) | ORAL | Status: DC
  Administered 2014-12-29 – 2014-12-30 (×2): 0.5 mg via ORAL
  Filled 2014-12-29 (×2): qty 1

## 2014-12-29 MED ORDER — TAMSULOSIN HCL 0.4 MG PO CAPS
0.4000 mg | ORAL_CAPSULE | Freq: Two times a day (BID) | ORAL | Status: DC
  Administered 2014-12-30: 0.4 mg via ORAL
  Filled 2014-12-29: qty 1

## 2014-12-29 MED ORDER — PANTOPRAZOLE SODIUM 40 MG PO TBEC
40.0000 mg | DELAYED_RELEASE_TABLET | Freq: Every day | ORAL | Status: DC
Start: 2014-12-29 — End: 2015-01-16

## 2014-12-29 MED ORDER — METOPROLOL TARTRATE 25 MG PO TABS
12.5000 mg | ORAL_TABLET | Freq: Two times a day (BID) | ORAL | Status: DC
  Administered 2014-12-29 – 2014-12-30 (×2): 12.5 mg via ORAL
  Filled 2014-12-29 (×2): qty 1

## 2014-12-29 MED ORDER — ALPRAZOLAM 0.5 MG PO TABS: 0.5000 mg | ORAL_TABLET | Freq: Two times a day (BID) | ORAL | Status: DC

## 2014-12-29 NOTE — Care Management Note (Signed)
Case Management Note  Patient Details  Name: Randy [name] MRN: [id number] Date of Birth: [birth year]  Subjective/Objective:                  Pt admitted from home with CVA. Pt lives alone and has 2 daughters who are very active in the care of the pt. Pt has been independent with ADL's and is still driving.   Action/Plan: PT recommends outpt PT and a cane at discharge. Will fax outpt referral form to [facility] center and give pt script for cane at discharge.  Expected Discharge Date:                  Expected Discharge Plan:  Home/Self Care  In-House Referral:  NA  Discharge planning Services  CM Consult  Post Acute Care Choice:  Durable Medical Equipment Choice offered to:  Patient  DME Arranged:    DME Agency:     HH Arranged:    [place] Agency:     Status of Service:  In process, will continue to follow  Medicare Important Message Given:    Date Medicare IM Given:    Medicare IM give by:    Date Additional Medicare IM Given:    Additional Medicare Important Message give by:     If discussed at [place] of Stay Meetings, dates discussed:    Additional Comments:  Joylene Draft, RN 12/29/2014, 1:44 PM

## 2014-12-29 NOTE — Discharge Summary (Signed)
Physician Discharge Summary  Shannan Garfinkel ZOX:[id number] DOB: [birth year] DOA: 12/28/2014  PCP: Sallee Lange, MD  Admit date: 12/28/2014 Discharge date: 12/29/2014  Time spent: 40 minutes  Recommendations for Outpatient Follow-up:  1. Follow up with PCP 1-2 weeks for evaluation of dementia, functional status effect of benzos 2. Follow up with Dr Merlene Laughter follow EEG results 3. Follow up with GI if dysphagia does not improve with reflux precautions.    Discharge Diagnoses:  Principal Problem:   Stroke Active Problems:   GERD   Osteoarthritis   BPH (benign prostatic hyperplasia)   Essential hypertension, benign   Anxiety state   Dysphagia   Insomnia   Claustrophobia   Discharge Condition: stable  Diet recommendation: heart healthy thin liquid  Filed Weights   12/28/14 1109 12/28/14 1716  Weight: 71.668 kg (158 lb) 71.804 kg (158 lb 4.8 oz)    History of present illness:  Patient reported a two-week history of intermittent episodes of body aches, dizziness, lightheadedness at presentation to ED on 12/28/14. These episodes typically occured in the morning when patient awakened and last for approximately 30 minutes. Resolved with rest. On  Morning of presentation patient woke with his usual symptoms but they were significantly worse, did not resolve spontaneously, and were associated with new symptoms of shortness of breath, and nausea. Patient stated he felt unstable on his feet. Denied any vertigo. Patient had not tried anything for the symptoms. Patient also reported a two-week history of intermittent dysphagia for solids and liquids and occasional sensation of fullness in his throat. Patient reported approximately 3-4 episodes of dysphagia during this period of time. Denied any hemoptysis, melena, unintentional weight loss, fevers, night sweats.  Hospital Course:  Stroke: CT noting White compartment acute infarct. CT angio negative for infarct. Echo Systolic function was vigorous.  Theestimated ejection fraction was in the range of 65% to 70%. Wall motion was normal; there were no regional wall motionabnormalities. Doppler parameters are consistent with abnormalleft ventricular relaxation (grade 1 diastolic dysfunction).  EKG without acute changes. Patient outside window for treatment as last nml prior to pt going to bed night prior. Did not tolerate MRI due to claustrophobia. Dr Merlene Laughter evaluated. Recommended EEG and will follow OP.l  No events on tele. Swallow recommending thin liquid with reflux precautions. OP  PT/OT arranged. Lipid panel and A1c unremarkable.   Dysphagia: intermittent and to solids and liquids. likely related esophageal dysmotility in setting of GERD and hiatal hernia s/p mesh. Continue PPI. Swallow eval recommends reflux precautions and follow up GI if symptoms worsen.   BPH status post prostatectomy: Patient with residual difficulty with urination - Continue Flomax  Anxiety: - continue Xanax   Chronic MSK pain: - continue tramadol  Insomnia: - Continue trazodone  HTN: Normotensive.    Procedures:  Echo as above  Consultations:  Dr Merlene Laughter  Discharge Exam: Filed Vitals:   12/29/14 1145  BP: 144/90  Pulse: 108  Temp: 98.4 F (36.9 C)  Resp: 18    General: well nourished appears well Cardiovascular: RRR no MGR No LE edema Respiratory: normal effort BS clear  Neuro: alert oriented cranial nerve II -XII intact  Discharge Instructions    Current Discharge Medication List    START taking these medications   Details  pantoprazole (PROTONIX) 40 MG tablet Take 1 tablet (40 mg total) by mouth daily. Qty: 30 tablet, Refills: 1      CONTINUE these medications which have NOT CHANGED   Details  ALPRAZolam (XANAX) 0.5 MG tablet  TAKE 1/2 TO 1 TABLET TWICE DAILY. Qty: 60 tablet, Refills: 4    amLODipine (NORVASC) 2.5 MG tablet Take 1 tablet (2.5 mg total) by mouth daily. Qty: 90 tablet, Refills: 1    fluticasone  (FLONASE) 50 MCG/ACT nasal spray USE 2 SPRAYS IN NOSTRIL DAILY Qty: 16 g, Refills: 5    tamsulosin (FLOMAX) 0.4 MG CAPS capsule Take 1 capsule (0.4 mg total) by mouth 2 (two) times daily. Qty: 60 capsule, Refills: 12    traMADol (ULTRAM) 50 MG tablet Take 1 tablet (50 mg total) by mouth 3 (three) times daily as needed. Qty: 90 tablet, Refills: 5    traZODone (DESYREL) 50 MG tablet Take 0.5-1 tablets (25-50 mg total) by mouth at bedtime as needed for sleep. Qty: 30 tablet, Refills: 3       No Known Allergies    The results of significant diagnostics from this hospitalization (including imaging, microbiology, ancillary and laboratory) are listed below for reference.    Significant Diagnostic Studies: Ct Angio Head W/cm &/or Wo Cm  12/28/2014   CLINICAL DATA:  Nausea with presyncope. No known injury. Two week history of intermittent dizziness and lightheadedness. Symptoms became worse yesterday.  EXAM: CT ANGIOGRAPHY HEAD AND NECK  TECHNIQUE: Multidetector CT imaging of the head and neck was performed using the standard protocol during bolus administration of intravenous contrast. Multiplanar CT image reconstructions and MIPs were obtained to evaluate the vascular anatomy. Carotid stenosis measurements (when applicable) are obtained utilizing NASCET criteria, using the distal internal carotid diameter as the denominator.  CONTRAST:  69mL OMNIPAQUE IOHEXOL 350 MG/ML SOLN  COMPARISON:  CT head earlier today.  Also CT head 03/22/2011.  FINDINGS: CT HEAD  Calvarium and skull base: No fracture or destructive lesion. Mastoids and middle ears are grossly clear.  Paranasal sinuses: Imaged portions are clear.  Orbits: Previous ocular surgery with low-attenuation collection superior to the RIGHT globe probably representing air. This appearance is similar to 2012.  Brain: No evidence of acute abnormality, including acute infarct, hemorrhage, hydrocephalus, or mass lesion. Mild atrophy. Slight hypoattenuation  of white matter consistent with chronic microvascular ischemic change. There is no significant change from CT head 2012.  CTA NECK  Aortic arch: Standard branching. Imaged portion shows no evidence of aneurysm or dissection. No significant stenosis of the major arch vessel origins.  Right carotid system: Minor calcific disease at the bifurcation. No evidence of dissection, stenosis (50% or greater) or occlusion.  Left carotid system: Minor calcific disease at the bifurcation. No evidence of dissection, stenosis (50% or greater) or occlusion.  Vertebral arteries: Codominant. No evidence of dissection, stenosis (50% or greater) or occlusion.  Nonvascular soft tissues: Biapical pleural thickening and air cyst formation. No pulmonary nodule. Cervical spondylosis without worrisome osseous lesion.  CTA HEAD  Anterior circulation: No significant stenosis, proximal occlusion, aneurysm, or vascular malformation.  Posterior circulation: No significant stenosis, proximal occlusion, aneurysm, or vascular malformation.  Venous sinuses: As permitted by contrast timing, patent.  Anatomic variants: Fetal RIGHT PCA.  Delayed phase:   No abnormal intracranial enhancement.  IMPRESSION: No intracranial or extracranial flow reducing lesion.  Within limits for evaluation on CT, no visible acute infarction. No intracranial hemorrhage.  No evidence for vertebrobasilar insufficiency.   Electronically Signed   By: Staci Righter M.D.   On: 12/28/2014 17:13   Ct Head Wo Contrast  12/28/2014   CLINICAL DATA:  Nausea and near syncope  EXAM: CT HEAD WITHOUT CONTRAST  TECHNIQUE: Contiguous axial images were obtained  from the base of the skull through the vertex without intravenous contrast.  COMPARISON:  March 22, 2011  FINDINGS: Motion artifact makes this study somewhat less than optimal. There is mild diffuse atrophy which is stable. There is no intracranial mass, hemorrhage, extra-axial fluid collection, or midline shift. Gray-white  compartments are normal. No acute infarct is evident. The bony calvarium appears intact. The mastoid air cells are clear. There is a small benign osteoma in the right ethmoid sinus region. There is stable decreased attenuation in the superior right globe, stable.  IMPRESSION: Mild diffuse atrophy. No intracranial mass, hemorrhage, or focal gray - white compartment lesions/acute appearing infarct. Focal area of decreased attenuation in the superior right globe is a stable finding.   Electronically Signed   By: Lowella Grip III M.D.   On: 12/28/2014 11:43   Ct Angio Neck W/cm &/or Wo/cm  12/28/2014   CLINICAL DATA:  Nausea with presyncope. No known injury. Two week history of intermittent dizziness and lightheadedness. Symptoms became worse yesterday.  EXAM: CT ANGIOGRAPHY HEAD AND NECK  TECHNIQUE: Multidetector CT imaging of the head and neck was performed using the standard protocol during bolus administration of intravenous contrast. Multiplanar CT image reconstructions and MIPs were obtained to evaluate the vascular anatomy. Carotid stenosis measurements (when applicable) are obtained utilizing NASCET criteria, using the distal internal carotid diameter as the denominator.  CONTRAST:  3mL OMNIPAQUE IOHEXOL 350 MG/ML SOLN  COMPARISON:  CT head earlier today.  Also CT head 03/22/2011.  FINDINGS: CT HEAD  Calvarium and skull base: No fracture or destructive lesion. Mastoids and middle ears are grossly clear.  Paranasal sinuses: Imaged portions are clear.  Orbits: Previous ocular surgery with low-attenuation collection superior to the RIGHT globe probably representing air. This appearance is similar to 2012.  Brain: No evidence of acute abnormality, including acute infarct, hemorrhage, hydrocephalus, or mass lesion. Mild atrophy. Slight hypoattenuation of white matter consistent with chronic microvascular ischemic change. There is no significant change from CT head 2012.  CTA NECK  Aortic arch: Standard  branching. Imaged portion shows no evidence of aneurysm or dissection. No significant stenosis of the major arch vessel origins.  Right carotid system: Minor calcific disease at the bifurcation. No evidence of dissection, stenosis (50% or greater) or occlusion.  Left carotid system: Minor calcific disease at the bifurcation. No evidence of dissection, stenosis (50% or greater) or occlusion.  Vertebral arteries: Codominant. No evidence of dissection, stenosis (50% or greater) or occlusion.  Nonvascular soft tissues: Biapical pleural thickening and air cyst formation. No pulmonary nodule. Cervical spondylosis without worrisome osseous lesion.  CTA HEAD  Anterior circulation: No significant stenosis, proximal occlusion, aneurysm, or vascular malformation.  Posterior circulation: No significant stenosis, proximal occlusion, aneurysm, or vascular malformation.  Venous sinuses: As permitted by contrast timing, patent.  Anatomic variants: Fetal RIGHT PCA.  Delayed phase:   No abnormal intracranial enhancement.  IMPRESSION: No intracranial or extracranial flow reducing lesion.  Within limits for evaluation on CT, no visible acute infarction. No intracranial hemorrhage.  No evidence for vertebrobasilar insufficiency.   Electronically Signed   By: Staci Righter M.D.   On: 12/28/2014 17:13   Dg Chest Portable 1 View  12/28/2014   CLINICAL DATA:  Shortness of Breath  EXAM: PORTABLE CHEST - 1 VIEW  COMPARISON:  January 06, 2012  FINDINGS: There is stable elevation of the right hemidiaphragm. There is no edema or consolidation. The heart size is upper normal with pulmonary vascularity within normal limits. No  adenopathy. There are surgical clips in the right upper quadrant region. No bone lesions.  IMPRESSION: Stable elevation right hemidiaphragm.  No edema or consolidation.   Electronically Signed   By: Lowella Grip III M.D.   On: 12/28/2014 11:27    Microbiology: No results found for this or any previous visit (from the  past 240 hour(s)).   Labs: Basic Metabolic Panel:  Recent Labs Lab 12/28/14 1100  NA 138  K 4.6  CL 99*  CO2 31  GLUCOSE 132*  BUN 18  CREATININE 1.22  CALCIUM 9.2   Liver Function Tests: No results for input(s): AST, ALT, ALKPHOS, BILITOT, PROT, ALBUMIN in the last 168 hours. No results for input(s): LIPASE, AMYLASE in the last 168 hours. No results for input(s): AMMONIA in the last 168 hours. CBC:  Recent Labs Lab 12/28/14 1100  WBC 6.1  NEUTROABS 4.7  HGB 16.6  HCT 48.8  MCV 89.5  PLT 151   Cardiac Enzymes:  Recent Labs Lab 12/28/14 1100  TROPONINI <0.03   BNP: BNP (last 3 results) No results for input(s): BNP in the last 8760 hours.  ProBNP (last 3 results) No results for input(s): PROBNP in the last 8760 hours.  CBG: No results for input(s): GLUCAP in the last 168 hours.     SignedRadene Gunning  [facility] 12/29/2014, 3:10 PM

## 2014-12-29 NOTE — Evaluation (Signed)
Physical Therapy Evaluation Patient Details Name: Randy [name] MRN: [id number] DOB: 03/30/1929 Today's Date: 12/29/2014   History of Present Illness  Patient reports a two-week history of intermittent episodes of body aches, dizziness, lightheadedness. These episodes typically occur in the morning when patient awakes and last for approximately 30 minutes. Resolve with rest. This morning patient woke with his usual symptoms but they were significantly worse, did not resolve spontaneously, and were associated with new symptoms of shortness of breath, and nausea. Patient states he felt unstable on his feet. Denies any vertigo. Patient has not tried anything for the symptoms. Patient also reports a two-week history of intermittent dysphagia for solids and liquids and occasional sensation of fullness in his throat. Patient reports approximately 3-4 episodes of dysphagia during this period of time. Denies any hemoptysis, melena, unintentional weight loss, fevers, night sweats.  Clinical Impression  Very pleasant elderly gentleman presenting with reduced functional activity tolerance and impaired balance at this time; he reports that while he has not had any falls per say, he has had some close calls at home due to his unsteadiness. Patient appears to be generally at his baseline level of mobility and function today. After discharge from this facility, patient will benefit from participating in skilled OP PT services; also recommended a single point cane for use at home due to patient's unsteadiness. No further skilled PT needs during his current hospital stay.     Follow Up Recommendations Outpatient PT    Equipment Recommendations  Cane    Recommendations for Other Services       Precautions / Restrictions Precautions Precautions: Fall Restrictions Weight Bearing Restrictions: No      Mobility  Bed Mobility Overal bed mobility: Independent                Transfers Overall transfer  level: Independent Equipment used: None                Ambulation/Gait Ambulation/Gait assistance: Independent Ambulation Distance (Feet): 300 Feet Assistive device: None Gait Pattern/deviations: Step-through pattern;Trunk flexed;Narrow base of support   Gait velocity interpretation: Below normal speed for age/gender General Gait Details: some general unsteadiesns noted during gait which did increase as patient fatigued   Stairs            Wheelchair Mobility    Modified Rankin (Stroke Patients Only)       Balance Overall balance assessment: Needs assistance Sitting-balance support: No upper extremity supported Sitting balance-Leahy Scale: Good     Standing balance support: No upper extremity supported Standing balance-Leahy Scale: Fair   Single Leg Stance - Right Leg: 5 Single Leg Stance - Left Leg: 1 Tandem Stance - Right Leg:  (unable ) Tandem Stance - Left Leg:  (unable )                     Pertinent Vitals/Pain Pain Assessment: No/denies pain    Home Living Family/patient expects to be discharged to:: Private residence Living Arrangements: Alone Available Help at Discharge: Family;Available PRN/intermittently Type of Home: House Home Access: Stairs to enter   [employer] of Steps: 2 with rail    Home Equipment: Walker - 2 wheels;Shower seat;Grab bars - tub/shower;Hand held shower head      Prior Function Level of Independence: Independent               Hand Dominance   Dominant Hand: Right    Extremity/Trunk Assessment   Upper Extremity Assessment: Defer to OT evaluation  Lower Extremity Assessment: Generalized weakness      Cervical / Trunk Assessment: Normal  Communication   Communication: No difficulties  Cognition Arousal/Alertness: Awake/alert Behavior During Therapy: WFL for tasks assessed/performed Overall Cognitive Status: Within Functional Limits for tasks assessed                       General Comments General comments (skin integrity, edema, etc.): very pleasant gentleman who's main concern is his unsteadiness when upright, also displays reduced functional activity tolerance     Exercises        Assessment/Plan    PT Assessment All further PT needs can be met in the next venue of care  PT Diagnosis Difficulty walking;Abnormality of gait;Other (comment) (impaired balance and functional activity tolerance )   PT Problem List Decreased strength;Decreased coordination;Decreased activity tolerance;Decreased balance;Decreased mobility  PT Treatment Interventions     PT Goals (Current goals can be found in the Care Plan section) Acute Rehab PT Goals Patient Stated Goal: to get better balance  PT Goal Formulation: With patient Time For Goal Achievement: 01/12/15 Potential to Achieve Goals: Good    Frequency     Barriers to discharge        Co-evaluation               End of Session Equipment Utilized During Treatment: Gait belt Activity Tolerance: Patient tolerated treatment well Patient left: in bed;with call bell/phone within reach           Time: 0843-0900 PT Time Calculation (min) (ACUTE ONLY): 17 min   Charges:   PT Evaluation $Initial PT Evaluation Tier I: 1 Procedure     PT G Codes:          PT, DPT [phone]   

## 2014-12-29 NOTE — Progress Notes (Signed)
Pharmacy Consult: Lovenox for VTE prophylaxis.  Patient Measurements: Height: 5\' 9"  (175.3 cm) Weight: 158 lb 4.8 oz (71.804 kg) IBW/kg (Calculated) : 70.7 Body mass index is 23.37 kg/(m^2).   VITALS Filed Vitals:   12/29/14 0745  BP: 160/79  Pulse: 102  Temp: 98.1 F (36.7 C)  Resp: 18    INR Last Three Days: Recent Labs     12/28/14  1100  INR  0.86    CBC:    Component Value Date/Time   WBC 6.1 12/28/2014 1100   RBC 5.45 12/28/2014 1100   HGB 16.6 12/28/2014 1100   HCT 48.8 12/28/2014 1100   PLT 151 12/28/2014 1100   MCV 89.5 12/28/2014 1100   MCH 30.5 12/28/2014 1100   MCHC 34.0 12/28/2014 1100   RDW 13.2 12/28/2014 1100   LYMPHSABS 0.8 12/28/2014 1100   MONOABS 0.6 12/28/2014 1100   EOSABS 0.1 12/28/2014 1100   BASOSABS 0.0 12/28/2014 1100    RENAL FUNCTION: Estimated Creatinine Clearance: 45.1 mL/min (by C-G formula based on Cr of 1.22).  Assessment: Dose stable for age, weight, renal function and indication.  Plan: Lovenox 40mg  SQ daily. Weekly creatinine Sign off.  Pricilla Larsson, [facility] 12/29/2014 10:26 AM

## 2014-12-29 NOTE — Evaluation (Signed)
Clinical/Bedside Swallow Evaluation Patient Details  Name: Randy [name] MRN: [id number] Date of Birth: [birth year]  Today's Date: 12/29/2014 Time: SLP Start Time (ACUTE ONLY): 7494 SLP Stop Time (ACUTE ONLY): 1018 SLP Time Calculation (min) (ACUTE ONLY): 27 min  Past Medical History:  Past Medical History  Diagnosis Date  . History of BPH   . DJD (degenerative joint disease)     cervical  . GERD (gastroesophageal reflux disease)     takes prilosec intermittently  . Anxiety     claustraphobia  . BNC (bladder neck contracture)   . Dysuria OCCASIONAL  . Urinary hesitancy   . Cervical spondylosis   . Hiatal hernia   . COPD (chronic obstructive pulmonary disease)   . Impaired fasting glucose   . Claustrophobia   . Hypertension   . Complication of anesthesia     claustraphobia   Past Surgical History:  Past Surgical History  Procedure Laterality Date  . Retinal detachment surgery  1988  . Cardiovascular stress test  11-08-2009  DR ROTHBART    LOW RISK MYOVIEW/ NO ISCHEMIA/ LVEF 64%  . Transurethral resection of bladder tumor  01/06/2012    Procedure: TRANSURETHRAL RESECTION OF BLADDER TUMOR (TURBT);  Surgeon: Franchot Gallo, MD;  Location: [facility];  Service: Urology;  Laterality: N/A;  . Transurethral resection of prostate    . Laparoscopic repair large hiatal hernia w/ mesh and nissen fundoplication  [id number]  . Cholecystectomy  1984    OPEN  . Cataract extraction w/ intraocular lens  implant, bilateral    . Transurethral resection of bladder neck  05/01/2012    Procedure: TRANSURETHRAL RESECTION OF BLADDER NECK;  Surgeon: Franchot Gallo, MD;  Location: [facility];  Service: Urology;  Laterality: N/A;  [name]   . Collapsed lung  1965  . Transurethral resection of prostate    . Prostate biopsy N/A 11/17/2012    Procedure: BIOPSY TRANSRECTAL ULTRASONIC PROSTATE (TUBP);  Surgeon: Franchot Gallo, MD;  Location: [facility];  Service: Urology;  Laterality: N/A;  45 MINS 1. TRANSRECTAL ULTRASOUND OF PROSTATE WITH BIOPSY -will need ultrasound tech 2.CYSTOSCOPY, BLADDER BIOPSY - need resectoscope- not Gyrus [id number] Medicare -[id number] BCBS - [id number]  . Cystoscopy with biopsy N/A 11/17/2012    Procedure: CYSTOSCOPY WITH BIOPSY;  Surgeon: Franchot Gallo, MD;  Location: [facility];  Service: Urology;  Laterality: N/A;  . Reposition of lens Right 03/31/2014    Procedure: REPOSITIONING OF INTRAOCULAR LENS IMPLANT RIGHT EYE;  Surgeon: Tonny Branch, MD;  Location: [facility];  Service: Ophthalmology;  Laterality: Right;   HPI:  Patient reports a two-week history of intermittent episodes of body aches, dizziness, lightheadedness. These episodes typically occur in the morning when patient awakes and last for approximately 30 minutes. Resolve with rest. This morning patient woke with his usual symptoms but they were significantly worse, did not resolve spontaneously, and were associated with new symptoms of shortness of breath, and nausea. Patient states he felt unstable on his feet. Denies any vertigo. Patient has not tried anything for the symptoms. Patient also reports a two-week history of intermittent dysphagia for solids and liquids and occasional sensation of fullness in his throat. Patient reports approximately 3-4 episodes of dysphagia during this period of time. Denies any hemoptysis, melena, unintentional weight loss, fevers, night sweats.   Assessment / Plan / Recommendation Clinical Impression  Mr. Blissett's oral motor examination is WFL; no weakness noted. Pt does have mild hoarse vocal quality  which he reports is atypical for him. Pt assessed with puree, thin, and regular textures which he initially tolerated well, but did have some coughing a few minutes after eating. Vocal quality mildly wet post po trials. Pt denies history of esophageal dysphagia (however chart review later reveals  extensive hiatal hernia with repair in 2011). Given pt's complaints of intermittent dysphagia over the last few weeks with both solids and liquids, hoarse vocal quality, and coughing after po trials, recommend MBSS to objectively evaluate swallow and identify strategies as appropriate. Family and pt in agreement with plan. Will complete MBSS this date. Results to follow.    Aspiration Risk  Mild    Diet Recommendation  (pending MBSS)   Medication Administration: Whole meds with liquid    Other  Recommendations     Follow Up Recommendations       Frequency and Duration        Pertinent Vitals/Pain VSS    SLP Swallow Goals   Pt will demonstrate safe and efficient consumption of least restrictive diet with use of strategies as needed.    Swallow Study Prior Functional Status       General Date of Onset: 12/28/14 Other Pertinent Information: Patient reports a two-week history of intermittent episodes of body aches, dizziness, lightheadedness. These episodes typically occur in the morning when patient awakes and last for approximately 30 minutes. Resolve with rest. This morning patient woke with his usual symptoms but they were significantly worse, did not resolve spontaneously, and were associated with new symptoms of shortness of breath, and nausea. Patient states he felt unstable on his feet. Denies any vertigo. Patient has not tried anything for the symptoms. Patient also reports a two-week history of intermittent dysphagia for solids and liquids and occasional sensation of fullness in his throat. Patient reports approximately 3-4 episodes of dysphagia during this period of time. Denies any hemoptysis, melena, unintentional weight loss, fevers, night sweats. Type of Study: Bedside swallow evaluation Previous Swallow Assessment: N/A Diet Prior to this Study: NPO Temperature Spikes Noted: No Respiratory Status: Room air History of Recent Intubation: No Behavior/Cognition:  Alert;Cooperative;Pleasant mood Oral Cavity - Dentition:  (dentures) Self-Feeding Abilities: Able to feed self Patient Positioning: Upright in chair/Tumbleform Baseline Vocal Quality: Hoarse Volitional Cough: Strong Volitional Swallow: Able to elicit    Oral/Motor/Sensory Function Overall Oral Motor/Sensory Function: Appears within functional limits for tasks assessed   Ice Chips Ice chips: Within functional limits   Thin Liquid Thin Liquid: Impaired Presentation: Cup;Self Fed;Straw Pharyngeal  Phase Impairments: Cough - Delayed    Nectar Thick Nectar Thick Liquid: Not tested   Honey Thick Honey Thick Liquid: Not tested   Puree Puree: Within functional limits Presentation: Self Fed;Spoon   Solid   Thank you,  Genene Churn, CCC-SLP [phone]     Solid: Impaired Presentation: Self Fed Pharyngeal Phase Impairments: Cough - Delayed       Idamay Hosein 12/29/2014, 12:11 PM

## 2014-12-29 NOTE — Evaluation (Signed)
Occupational Therapy Evaluation Patient Details Name: Randy [name] MRN: [id number] DOB: [birth year] Today's Date: 12/29/2014    History of Present Illness Patient is an 81 y/o male who reports a two-week history of intermittent episodes of body aches, dizziness, lightheadedness. These episodes typically occur in the morning when patient awakes and last for approximately 30 minutes. Resolve with rest. This morning patient woke with his usual symptoms but they were significantly worse, did not resolve spontaneously, and were associated with new symptoms of shortness of breath, and nausea. Patient states he felt unstable on his feet. Denies any vertigo. Patient has not tried anything for the symptoms. Patient also reports a two-week history of intermittent dysphagia for solids and liquids and occasional sensation of fullness in his throat. Patient reports approximately 3-4 episodes of dysphagia during this period of time. Denies any hemoptysis, melena, unintentional weight loss, fevers, night sweats.   Clinical Impression   PTA pt lived at home alone, family lives within 15 mile radius. Pt reports independence in all B/IADL tasks, limits driving to town only. Pt is independent in bed mobility and mod I in LB dressing tasks. Pt demonstrates BUE range of motion WNL, and good strength (4+/5). Pt is at baseline with B/IADL tasks, no further occupational therapy services required at this time.      Follow Up Recommendations  No OT follow up    Equipment Recommendations  None recommended by OT       Precautions / Restrictions Precautions Precautions: None Restrictions Weight Bearing Restrictions: No      Mobility Bed Mobility Overal bed mobility: Independent                        ADL Overall ADL's : Modified independent                     Lower Body Dressing: Modified independent                       Vision Vision Assessment?: Yes Eye Alignment: Within  Functional Limits Ocular Range of Motion: Within Functional Limits Alignment/Gaze Preference: Within Defined Limits Tracking/Visual Pursuits: Able to track stimulus in all quads without difficulty Saccades: Within functional limits Convergence: Within functional limits Visual Fields: No apparent deficits          Pertinent Vitals/Pain Pain Assessment: No/denies pain     Hand Dominance Right   Extremity/Trunk Assessment Upper Extremity Assessment Upper Extremity Assessment: Overall WFL for tasks assessed   Lower Extremity Assessment Lower Extremity Assessment: Defer to PT evaluation       Communication Communication Communication: No difficulties   Cognition Arousal/Alertness: Awake/alert Behavior During Therapy: WFL for tasks assessed/performed Overall Cognitive Status: Within Functional Limits for tasks assessed                                Home Living Family/patient expects to be discharged to:: Private residence Living Arrangements: Alone Available Help at Discharge: Family;Available PRN/intermittently Type of Home: House             Bathroom Shower/Tub: Walk-in Psychologist, prison and probation services: Standard     Home Equipment: Environmental consultant - 2 wheels;Shower seat;Grab bars - tub/shower;Hand held shower head          Prior Functioning/Environment Level of Independence: Independent              End of Session  Activity Tolerance: Patient tolerated treatment well Patient left: in bed;with call bell/phone within reach;Other (comment) (PT in room)   Time: 2787-1836 OT Time Calculation (min): 16 min Charges:  OT General Charges $OT Visit: 1 Procedure OT Evaluation $Initial OT Evaluation Tier I: 1 Procedure  Guadelupe Sabin, OTR/L  [phone]  12/29/2014, 9:01 AM

## 2014-12-29 NOTE — Progress Notes (Addendum)
Speech Language Pathology MBSS   Patient Details Name: Randy [name] MRN: [id number] DOB: 1928-11-19 Today's Date: 12/29/2014 Time: 5053-9767 SLP Time Calculation (min) (ACUTE ONLY): 26 min   MBSS completed this date. Full results are now found in the Imaging section.  [name] was seen for MBSS this date after BSE showed soft signs of aspiration. Pt assessed with barium tinged thin liquid, puree, regular textures, and barium tablet in the lateral position seated upright in the Hausted chair. Pt with mild pharyngeal phase dysphagia characterized by mild premature spillage with large cup and straw sips thin with swallow trigger after spilling towards pyriforms resulting in variable flash penetration of thins without aspiration and min residuals noted in lateral channels and pyriforms with thin. Mild residuals with puree texture near CP segment. Pt appeared to have greater residuals in the right pyriform, but residuals cleared with head turn to left or right after the swallow. Pt did present with wet vocal quality, but no barium noted in laryngeal vestibule. During esophageal sweep, SLP noted what appeared to be a hiatal hernia which barium tablet was retained in despite liquid wash. When pt was given puree and thin liquid in effort to clear barium tablet, tertiary contractions noted above hiatal hernia, but esophagus did eventually clear (except for the barium tablet). Pt was asked if he had ever been told that he had a hiatal hernia and responded "no", however another daughter present stated that he did in fact have a procedure to repair a large hiatal hernia "a few years ago" in [place]. SLP delved further in the chart to find that pt did have laparoscopic repair of large hiatal hernia with biologic mesh reinforcement and Nissen fundoplication 3/41/9379 with Dr. Excell Seltzer in [place]. Recommend regular diet with thin liquids with implementation of standard aspiration and reflux  precautions (reviewed with pt and family), swallow 2x for each sip/bite, and consider frequent smaller meals rather than large meals. Pt also to take small sips of liquids (straw ok with small sips). No further SLP services indicated at this time. Pt and his daughters were advised to monitor for any exacerbation of symptoms (increased coughing, significant early satiety, weight loss) and consider follow up with Dr. Excell Seltzer. Chest CT may also be able to show hiatal hernia. Once completed in 2013 did show small hiatal hernia. Nissen fundoplications can "slip", but unable to tell with modified barium swallow study and radiologist not available.  Thank you,  Genene Churn, [place]  [place] 12/29/2014, 3:26 PM

## 2014-12-30 ENCOUNTER — Inpatient Hospital Stay (HOSPITAL_COMMUNITY)
Admit: 2014-12-30 | Discharge: 2014-12-30 | Disposition: A | Discharge status: Home / Self Care | Payer: Medicare Other | Attending: Neurology | Admitting: Neurology

## 2014-12-30 DIAGNOSIS — R1310 Dysphagia, unspecified: Secondary | ICD-10-CM

## 2014-12-30 LAB — HOMOCYSTEINE: HOMOCYSTEINE-NORM: 14.9 umol/L (ref 0.0–15.0)

## 2014-12-30 LAB — HEMOGLOBIN A1C
Hgb A1c MFr Bld: 6.1 % — ABNORMAL HIGH (ref 4.8–5.6)
Mean Plasma Glucose: 128 mg/dL

## 2014-12-30 LAB — BASIC METABOLIC PANEL
Anion gap: 6 (ref 5–15)
BUN: 13 mg/dL (ref 6–20)
CO2: 31 mmol/L (ref 22–32)
Calcium: 8.5 mg/dL — ABNORMAL LOW (ref 8.9–10.3)
Chloride: 104 mmol/L (ref 101–111)
Creatinine, Ser: 1.06 mg/dL (ref 0.61–1.24)
GFR calc Af Amer: >60 mL/min (ref >60)
Glucose, Bld: 105 mg/dL — ABNORMAL HIGH (ref 65–99)
POTASSIUM: 4.1 mmol/L (ref 3.5–5.1)
SODIUM: 141 mmol/L (ref 135–145)

## 2014-12-30 MED ORDER — METOPROLOL TARTRATE 25 MG PO TABS: 12.5000 mg | ORAL_TABLET | Freq: Two times a day (BID) | ORAL | Status: DC

## 2014-12-30 MED ORDER — STROKE: EARLY STAGES OF RECOVERY BOOK
Freq: Once | Status: DC
  Filled 2014-12-30: qty 1

## 2014-12-30 NOTE — Procedures (Signed)
  [place]on A. Merlene Laughter, MD     [email or web address]           HISTORY: The patient is a 81 year old who presents with episodes of confusion. The study is being done to evaluate for possible seizures.   MEDICATIONS: Scheduled Meds: .  stroke: mapping our early stages of recovery book   Does not apply Once  . ALPRAZolam  0.5 mg Oral BID  . enoxaparin (LOVENOX) injection  40 mg Subcutaneous Q24H  . metoprolol tartrate  12.5 mg Oral BID  . pantoprazole  40 mg Oral Daily  . tamsulosin  0.4 mg Oral BID   Continuous Infusions: . sodium chloride 75 mL/hr at 12/30/14 0545   PRN Meds:.hydrALAZINE, traMADol, traZODone  Prior to Admission medications   Medication Sig Start Date End Date Taking? Authorizing Provider  ALPRAZolam (XANAX) 0.5 MG tablet TAKE 1/2 TO 1 TABLET TWICE DAILY. 10/13/14  Yes Kathyrn Drown, MD  amLODipine (NORVASC) 2.5 MG tablet Take 1 tablet (2.5 mg total) by mouth daily. 07/08/14  Yes Kathyrn Drown, MD  fluticasone (FLONASE) 50 MCG/ACT nasal spray USE 2 SPRAYS IN NOSTRIL DAILY 05/23/14  Yes Kathyrn Drown, MD  tamsulosin (FLOMAX) 0.4 MG CAPS capsule Take 1 capsule (0.4 mg total) by mouth 2 (two) times daily. 12/09/14  Yes Kathyrn Drown, MD  traMADol (ULTRAM) 50 MG tablet Take 1 tablet (50 mg total) by mouth 3 (three) times daily as needed. Patient taking differently: Take 50 mg by mouth 3 (three) times daily as needed for moderate pain.  08/17/14  Yes Kathyrn Drown, MD  traZODone (DESYREL) 50 MG tablet Take 0.5-1 tablets (25-50 mg total) by mouth at bedtime as needed for sleep. 12/09/14  Yes Kathyrn Drown, MD  metoprolol tartrate (LOPRESSOR) 25 MG tablet Take 0.5 tablets (12.5 mg total) by mouth 2 (two) times daily. 12/30/14   Radene Gunning, NP  pantoprazole (PROTONIX) 40 MG tablet Take 1 tablet (40 mg total) by mouth daily. 12/29/14   Radene Gunning, NP      ANALYSIS: A 16 channel recording using standard 10 20 measurements is conducted for 22 minutes.  There is a well formed posterior of 11 to 12 hz which attenuates with eye opening. There is activity observe and frontal areas. Awake and drowsy activities are observed. Photic stimulation and hyperventilation are not carried out. There are no focal slowing. There are no lateral slowing. There is no epileptiform activity is observed.    IMPRESSION: This is a normal recording and drowsy states.       Jasemine Nawaz A. Merlene Laughter, M.D.  Diplomate, Tax adviser of Psychiatry and Neurology ( Neurology).

## 2014-12-30 NOTE — Progress Notes (Signed)
EEG Completed; Results Pending  

## 2014-12-30 NOTE — Discharge Summary (Signed)
Physician Discharge Summary  Randy [name] DJM:[id number] DOB: 1928-08-23 DOA: 12/28/2014  PCP: Sallee Lange, MD  Admit date: 12/28/2014 Discharge date: 12/30/2014  Time spent: 40 minutes  Recommendations for Outpatient Follow-up:  1. Follow up with PCP 1-2 weeks for evaluation of dementia, functional status effect of benzos and BP control as medications changed.  2. Follow up with Dr Merlene Laughter follow EEG results 3. Follow up with GI if dysphagia does not improve with reflux precautions.  Discharge Diagnoses:  Principal Problem:   Near syncope Active Problems:   GERD   Osteoarthritis   BPH (benign prostatic hyperplasia)   Essential hypertension, benign   Anxiety state   Dysphagia   Insomnia   Claustrophobia   Discharge Condition: stable  Diet recommendation: heart healthy thin  Filed Weights   12/28/14 1109 12/28/14 1716  Weight: 71.668 kg (158 lb) 71.804 kg (158 lb 4.8 oz)    History of present illness:  Patient reported a two-week history of intermittent episodes of body aches, dizziness, lightheadedness at presentation to ED on 12/28/14. These episodes typically occured in the morning when patient awakened and last for approximately 30 minutes. Resolved with rest. On Morning of presentation patient woke with his usual symptoms but they were significantly worse, did not resolve spontaneously, and were associated with new symptoms of shortness of breath, and nausea. Patient stated he felt unstable on his feet. Denied any vertigo. Patient had not tried anything for the symptoms. Patient also reported a two-week history of intermittent dysphagia for solids and liquids and occasional sensation of fullness in his throat. Patient reported approximately 3-4 episodes of dysphagia during this period of time. Denied any hemoptysis, melena, unintentional weight loss, fevers, night sweats.   Hospital Course:  Near syncope: CT noting White compartment acute infarct. CT angio negative for  infarct. Echo Systolic function was vigorous. Theestimated ejection fraction was in the range of 65% to 70%. Wallmotion was normal; there were no regional wall motionabnormalities. Doppler parameters are consistent with abnormalleft ventricular relaxation (grade 1 diastolic dysfunction). EKG without acute changes. Did not tolerate MRI due to claustrophobia. Dr Merlene Laughter evaluated. Recommended EEG and will follow OP.l No events on tele. Swallow recommending thin liquid with reflux precautions. OP PT/OT arranged. Lipid panel and A1c unremarkable.   Dysphagia: intermittent and to solids and liquids. likely related esophageal dysmotility in setting of GERD and hiatal hernia s/p mesh. Continue PPI. Swallow eval recommends reflux precautions and follow up GI if symptoms worsen.   BPH status post prostatectomy: Patient with residual difficulty with urination - Continue Flomax  Anxiety: - continue Xanax   Chronic MSK pain: - continue tramadol  Insomnia: - Continue trazodone  HTN: poor control. Home meds include norvasc. BB provided and norvasc discontinued at discharge. BP improved control. follow up OP  Procedures: Wall thickness was at the upper limits of normal. Systolic function was vigorous. The estimated ejection fraction was in the range of 65% to 70%. Wall motion was normal; there were no regional wall motion abnormalities. Doppler parameters are consistent with abnormal left ventricular relaxation (grade 1 diastolic dysfunction).  Consultations:  neurology  Discharge Exam: Filed Vitals:   12/30/14 0847  BP: 143/77  Pulse: 99  Temp: 97.8 F (36.6 C)  Resp: 16    General: appears well  Cardiovascular: RRR no MGR No Le edema Respiratory: normal effort BS clear bilaterally no wheeze  Discharge Instructions    Current Discharge Medication List    START taking these medications   Details  metoprolol  tartrate (LOPRESSOR) 25 MG tablet Take 0.5 tablets  (12.5 mg total) by mouth 2 (two) times daily. Qty: 60 tablet, Refills: 1    pantoprazole (PROTONIX) 40 MG tablet Take 1 tablet (40 mg total) by mouth daily. Qty: 30 tablet, Refills: 1      CONTINUE these medications which have NOT CHANGED   Details  ALPRAZolam (XANAX) 0.5 MG tablet TAKE 1/2 TO 1 TABLET TWICE DAILY. Qty: 60 tablet, Refills: 4    fluticasone (FLONASE) 50 MCG/ACT nasal spray USE 2 SPRAYS IN NOSTRIL DAILY Qty: 16 g, Refills: 5    tamsulosin (FLOMAX) 0.4 MG CAPS capsule Take 1 capsule (0.4 mg total) by mouth 2 (two) times daily. Qty: 60 capsule, Refills: 12    traMADol (ULTRAM) 50 MG tablet Take 1 tablet (50 mg total) by mouth 3 (three) times daily as needed. Qty: 90 tablet, Refills: 5    traZODone (DESYREL) 50 MG tablet Take 0.5-1 tablets (25-50 mg total) by mouth at bedtime as needed for sleep. Qty: 30 tablet, Refills: 3      STOP taking these medications     amLODipine (NORVASC) 2.5 MG tablet        No Known Allergies    The results of significant diagnostics from this hospitalization (including imaging, microbiology, ancillary and laboratory) are listed below for reference.    Significant Diagnostic Studies: Ct Angio Head W/cm &/or Wo Cm  12/28/2014   CLINICAL DATA:  Nausea with presyncope. No known injury. Two week history of intermittent dizziness and lightheadedness. Symptoms became worse yesterday.  EXAM: CT ANGIOGRAPHY HEAD AND NECK  TECHNIQUE: Multidetector CT imaging of the head and neck was performed using the standard protocol during bolus administration of intravenous contrast. Multiplanar CT image reconstructions and MIPs were obtained to evaluate the vascular anatomy. Carotid stenosis measurements (when applicable) are obtained utilizing NASCET criteria, using the distal internal carotid diameter as the denominator.  CONTRAST:  80mL OMNIPAQUE IOHEXOL 350 MG/ML SOLN  COMPARISON:  CT head earlier today.  Also CT head 03/22/2011.  FINDINGS: CT HEAD   Calvarium and skull base: No fracture or destructive lesion. Mastoids and middle ears are grossly clear.  Paranasal sinuses: Imaged portions are clear.  Orbits: Previous ocular surgery with low-attenuation collection superior to the RIGHT globe probably representing air. This appearance is similar to 2012.  Brain: No evidence of acute abnormality, including acute infarct, hemorrhage, hydrocephalus, or mass lesion. Mild atrophy. Slight hypoattenuation of white matter consistent with chronic microvascular ischemic change. There is no significant change from CT head 2012.  CTA NECK  Aortic arch: Standard branching. Imaged portion shows no evidence of aneurysm or dissection. No significant stenosis of the major arch vessel origins.  Right carotid system: Minor calcific disease at the bifurcation. No evidence of dissection, stenosis (50% or greater) or occlusion.  Left carotid system: Minor calcific disease at the bifurcation. No evidence of dissection, stenosis (50% or greater) or occlusion.  Vertebral arteries: Codominant. No evidence of dissection, stenosis (50% or greater) or occlusion.  Nonvascular soft tissues: Biapical pleural thickening and air cyst formation. No pulmonary nodule. Cervical spondylosis without worrisome osseous lesion.  CTA HEAD  Anterior circulation: No significant stenosis, proximal occlusion, aneurysm, or vascular malformation.  Posterior circulation: No significant stenosis, proximal occlusion, aneurysm, or vascular malformation.  Venous sinuses: As permitted by contrast timing, patent.  Anatomic variants: Fetal RIGHT PCA.  Delayed phase:   No abnormal intracranial enhancement.  IMPRESSION: No intracranial or extracranial flow reducing lesion.  Within limits for  evaluation on CT, no visible acute infarction. No intracranial hemorrhage.  No evidence for vertebrobasilar insufficiency.   Electronically Signed   By: Staci Righter M.D.   On: 12/28/2014 17:13   Ct Head Wo Contrast  12/28/2014    CLINICAL DATA:  Nausea and near syncope  EXAM: CT HEAD WITHOUT CONTRAST  TECHNIQUE: Contiguous axial images were obtained from the base of the skull through the vertex without intravenous contrast.  COMPARISON:  March 22, 2011  FINDINGS: Motion artifact makes this study somewhat less than optimal. There is mild diffuse atrophy which is stable. There is no intracranial mass, hemorrhage, extra-axial fluid collection, or midline shift. Gray-white compartments are normal. No acute infarct is evident. The bony calvarium appears intact. The mastoid air cells are clear. There is a small benign osteoma in the right ethmoid sinus region. There is stable decreased attenuation in the superior right globe, stable.  IMPRESSION: Mild diffuse atrophy. No intracranial mass, hemorrhage, or focal gray - white compartment lesions/acute appearing infarct. Focal area of decreased attenuation in the superior right globe is a stable finding.   Electronically Signed   By: Lowella Grip III M.D.   On: 12/28/2014 11:43   Ct Angio Neck W/cm &/or Wo/cm  12/28/2014   CLINICAL DATA:  Nausea with presyncope. No known injury. Two week history of intermittent dizziness and lightheadedness. Symptoms became worse yesterday.  EXAM: CT ANGIOGRAPHY HEAD AND NECK  TECHNIQUE: Multidetector CT imaging of the head and neck was performed using the standard protocol during bolus administration of intravenous contrast. Multiplanar CT image reconstructions and MIPs were obtained to evaluate the vascular anatomy. Carotid stenosis measurements (when applicable) are obtained utilizing NASCET criteria, using the distal internal carotid diameter as the denominator.  CONTRAST:  18mL OMNIPAQUE IOHEXOL 350 MG/ML SOLN  COMPARISON:  CT head earlier today.  Also CT head 03/22/2011.  FINDINGS: CT HEAD  Calvarium and skull base: No fracture or destructive lesion. Mastoids and middle ears are grossly clear.  Paranasal sinuses: Imaged portions are clear.  Orbits:  Previous ocular surgery with low-attenuation collection superior to the RIGHT globe probably representing air. This appearance is similar to 2012.  Brain: No evidence of acute abnormality, including acute infarct, hemorrhage, hydrocephalus, or mass lesion. Mild atrophy. Slight hypoattenuation of white matter consistent with chronic microvascular ischemic change. There is no significant change from CT head 2012.  CTA NECK  Aortic arch: Standard branching. Imaged portion shows no evidence of aneurysm or dissection. No significant stenosis of the major arch vessel origins.  Right carotid system: Minor calcific disease at the bifurcation. No evidence of dissection, stenosis (50% or greater) or occlusion.  Left carotid system: Minor calcific disease at the bifurcation. No evidence of dissection, stenosis (50% or greater) or occlusion.  Vertebral arteries: Codominant. No evidence of dissection, stenosis (50% or greater) or occlusion.  Nonvascular soft tissues: Biapical pleural thickening and air cyst formation. No pulmonary nodule. Cervical spondylosis without worrisome osseous lesion.  CTA HEAD  Anterior circulation: No significant stenosis, proximal occlusion, aneurysm, or vascular malformation.  Posterior circulation: No significant stenosis, proximal occlusion, aneurysm, or vascular malformation.  Venous sinuses: As permitted by contrast timing, patent.  Anatomic variants: Fetal RIGHT PCA.  Delayed phase:   No abnormal intracranial enhancement.  IMPRESSION: No intracranial or extracranial flow reducing lesion.  Within limits for evaluation on CT, no visible acute infarction. No intracranial hemorrhage.  No evidence for vertebrobasilar insufficiency.   Electronically Signed   By: Roderic Ovens.D.  On: 12/28/2014 17:13   Dg Chest Portable 1 View  12/28/2014   CLINICAL DATA:  Shortness of Breath  EXAM: PORTABLE CHEST - 1 VIEW  COMPARISON:  January 06, 2012  FINDINGS: There is stable elevation of the right  hemidiaphragm. There is no edema or consolidation. The heart size is upper normal with pulmonary vascularity within normal limits. No adenopathy. There are surgical clips in the right upper quadrant region. No bone lesions.  IMPRESSION: Stable elevation right hemidiaphragm.  No edema or consolidation.   Electronically Signed   By: Lowella Grip III M.D.   On: 12/28/2014 11:27   Dg Swallowing Func-speech Pathology  12/29/2014    Objective Swallowing Evaluation:   Modified Barium Swallow Study Patient Details  Name: Randy [name] MRN: [id number] Date of Birth: May 30, 1929  Today's Date: 12/29/2014 Time: SLP Start Time (ACUTE ONLY): 1210-SLP Stop Time (ACUTE ONLY): 1236 SLP Time Calculation (min) (ACUTE ONLY): 26 min  Past Medical History:  Past Medical History  Diagnosis Date  . History of BPH   . DJD (degenerative joint disease)     cervical  . GERD (gastroesophageal reflux disease)     takes prilosec intermittently  . Anxiety     claustraphobia  . BNC (bladder neck contracture)   . Dysuria OCCASIONAL  . Urinary hesitancy   . Cervical spondylosis   . Hiatal hernia   . COPD (chronic obstructive pulmonary disease)   . Impaired fasting glucose   . Claustrophobia   . Hypertension   . Complication of anesthesia     claustraphobia   Past Surgical History:  Past Surgical History  Procedure Laterality Date  . Retinal detachment surgery  1988  . Cardiovascular stress test  11-08-2009  DR ROTHBART    LOW RISK MYOVIEW/ NO ISCHEMIA/ LVEF 64%  . Transurethral resection of bladder tumor  01/06/2012    Procedure: TRANSURETHRAL RESECTION OF BLADDER TUMOR (TURBT);  Surgeon:  Franchot Gallo, MD;  Location: [facility];  Service:  Urology;  Laterality: N/A;  . Transurethral resection of prostate    . Laparoscopic repair large hiatal hernia w/ mesh and nissen  fundoplication  30-16-0109  . Cholecystectomy  1984    OPEN  . Cataract extraction w/ intraocular lens  implant, bilateral    . Transurethral resection of bladder  neck  05/01/2012    Procedure: TRANSURETHRAL RESECTION OF BLADDER NECK;  Surgeon: Franchot Gallo, MD;  Location: [facility];  Service: Urology;   Laterality: N/A;  [place]   . Collapsed lung  1965  . Transurethral resection of prostate    . Prostate biopsy N/A 11/17/2012    Procedure: BIOPSY TRANSRECTAL ULTRASONIC PROSTATE (TUBP);  Surgeon:  Franchot Gallo, MD;  Location: [facility];  Service: Urology;  Laterality:  N/A;  45 MINS 1. TRANSRECTAL ULTRASOUND OF PROSTATE WITH BIOPSY -will need ultrasound  tech 2.CYSTOSCOPY, BLADDER BIOPSY - need resectoscope- not Gyrus [id number] Medicare -[id number] BCBS - [id number]  . Cystoscopy with biopsy N/A 11/17/2012    Procedure: CYSTOSCOPY WITH BIOPSY;  Surgeon: Franchot Gallo, MD;   Location: [facility];  Service: Urology;  Laterality: N/A;  . Reposition of lens Right 03/31/2014    Procedure: REPOSITIONING OF INTRAOCULAR LENS IMPLANT RIGHT EYE;   Surgeon: Tonny Branch, MD;  Location: [facility];  Service: Ophthalmology;   Laterality: Right;   HPI:  Other Pertinent Information: Patient reports a two-week history of  intermittent episodes of body aches, dizziness, lightheadedness. These  episodes typically occur in the morning when patient awakes and last for  approximately 30 minutes. Resolve with rest. This morning patient woke  with his usual symptoms but they were significantly worse, did not resolve  spontaneously, and were associated with new symptoms of shortness of  breath, and nausea. Patient states he felt unstable on his feet. Denies  any vertigo. Patient has not tried anything for the symptoms. Patient also  reports a two-week history of intermittent dysphagia for solids and  liquids and occasional sensation of fullness in his throat. Patient  reports approximately 3-4 episodes of dysphagia during this period of  time. Denies any hemoptysis, melena, unintentional weight loss, fevers,  night sweats.  No Data  Recorded  Assessment / Plan / Recommendation CHL IP CLINICAL IMPRESSIONS 12/29/2014  Therapy Diagnosis Mild pharyngeal phase dysphagia;Suspected primary  esophageal dysphagia  Clinical Impression Mr. Jermon Chalfant was seen for MBSS this date after BSE  showed soft signs of aspiration. Pt assessed with barium tinged thin  liquid, puree, regular textures, and barium tablet in the lateral position  seated upright in the Ha[place] chair. Pt with mild pharyngeal phase  dysphagia characterized by mild premature spillage with large cup and  straw sips thin with swallow trigger after spilling towards pyriforms  resulting in variable flash penetration of thins without aspiration and  min residuals noted in lateral channels and pyriforms with thin. Mild  residuals with puree texture near CP segment. Pt appeared to have greater  residuals in the right pyriform, but residuals cleared with head turn to  left or right after the swallow. Pt did present with wet vocal quality,  but no barium noted in laryngeal vestibule. During esophageal sweep, SLP  noted what appeared to be a hiatal hernia which barium tablet was retained  in despite liquid wash. When pt was given puree and thin liquid in effort  to clear barium tablet, tertiary contractions noted above hiatal hernia,  but esophagus did eventually clear (except for the barium tablet). Pt was  asked if he had ever been told that he had a hiatal hernia and responded  "no", however another daughter present stated that he did in fact have a  procedure to repair a large hiatal hernia "a few years ago" in [place].  SLP delved further in the chart to find that pt did have laparoscopic  repair of large hiatal hernia with biologic mesh reinforcement and Nissen  fundoplication 8/93/8101 with Dr. Excell Seltzer in [place].  Recommend regular diet with thin liquids with implementation of standard  aspiration and reflux precautions (reviewed with pt and family), swallow  2x for each  sip/bite, and consider frequent smaller meals rather than  large meals. Pt also to take small sips of liquids (straw ok with small  sips). No further SLP services indicated at this time. Pt and his  daughters were advised to monitor for any exacerbation of symptoms  (increased coughing, significant early satiety, weight loss) and consider  follow up with Dr. Excell Seltzer. Chest CT may also be able to show hiatal  hernia. Once completed in 2013 did show small hiatal hernia. Nissen  fundoplications can "slip", but unable to tell with modified barium  swallow study and radiologist not available.      CHL IP TREATMENT RECOMMENDATION 12/29/2014  Treatment Recommendations No treatment recommended at this time     CHL IP DIET RECOMMENDATION 12/29/2014  SLP Diet Recommendations Thin  Liquid Administration via (None)  Medication Administration Whole meds with liquid  Compensations Multiple dry swallows after each bite/sip  Postural Changes and/or Swallow Maneuvers (None)     CHL IP OTHER RECOMMENDATIONS 12/29/2014  Recommended Consults Consider GI evaluation  Oral Care Recommendations Oral care BID  Other Recommendations Clarify dietary restrictions     No flowsheet data found.   No flowsheet data found.   Pertinent Vitals/Pain VSS    SLP Swallow Goals No flowsheet data found.  No flowsheet data found.    Thank you,  Genene Churn, [place]                  [place] 12/29/2014, 3:20 PM     Microbiology: No results found for this or any previous visit (from the past 240 hour(s)).   Labs: Basic Metabolic Panel:  Recent Labs Lab 12/28/14 1100 12/30/14 0524  NA 138 141  K 4.6 4.1  CL 99* 104  CO2 31 31  GLUCOSE 132* 105*  BUN 18 13  CREATININE 1.22 1.06  CALCIUM 9.2 8.5*   Liver Function Tests: No results for input(s): AST, ALT, ALKPHOS, BILITOT, PROT, ALBUMIN in the last 168 hours. No results for input(s): LIPASE, AMYLASE in the last 168 hours. No results for input(s): AMMONIA in the last 168  hours. CBC:  Recent Labs Lab 12/28/14 1100  WBC 6.1  NEUTROABS 4.7  HGB 16.6  HCT 48.8  MCV 89.5  PLT 151   Cardiac Enzymes:  Recent Labs Lab 12/28/14 1100  TROPONINI <0.03   BNP: BNP (last 3 results) No results for input(s): BNP in the last 8760 hours.  ProBNP (last 3 results) No results for input(s): PROBNP in the last 8760 hours.  CBG: No results for input(s): GLUCAP in the last 168 hours.     SignedRadene Gunning  [facility] 12/30/2014, 10:39 AM

## 2014-12-30 NOTE — Care Management Note (Addendum)
Case Management Note  Patient Details  Name: Randy [name] MRN: [id number] Date of Birth: [birth year]  Expected Discharge Date:                  Expected Discharge Plan:  Home/Self Care  In-House Referral:  NA  Discharge planning Services  CM Consult  Post Acute Care Choice:  Durable Medical Equipment Choice offered to:  Patient  DME Arranged:    DME Agency:     HH Arranged:    [place] Agency:     Status of Service:  Completed, signed off.   Medicare Important Message Given:    Date Medicare IM Given:    Medicare IM give by:    Date Additional Medicare IM Given:    Additional Medicare Important Message give by:     If discussed at [place] of Stay Meetings, dates discussed:    Additional Comments: Pt discharging home today with OP PT. Referral has been faxed to [facility] rehab. Pt given order for cane. No further CM needs.  Sherald Barge, RN 12/30/2014, 10:49 AM

## 2015-01-10 DIAGNOSIS — R2681 Unsteadiness on feet: Secondary | ICD-10-CM | POA: Diagnosis not present

## 2015-01-10 DIAGNOSIS — R1310 Dysphagia, unspecified: Secondary | ICD-10-CM | POA: Diagnosis not present

## 2015-01-10 DIAGNOSIS — I10 Essential (primary) hypertension: Secondary | ICD-10-CM | POA: Diagnosis not present

## 2015-01-10 DIAGNOSIS — I951 Orthostatic hypotension: Secondary | ICD-10-CM | POA: Diagnosis not present

## 2015-01-11 ENCOUNTER — Encounter: Payer: Self-pay | Admitting: Family Medicine

## 2015-01-11 ENCOUNTER — Ambulatory Visit (INDEPENDENT_AMBULATORY_CARE_PROVIDER_SITE_OTHER): Payer: Medicare Other | Admitting: Family Medicine

## 2015-01-11 VITALS — BP 126/82 | Ht 69.0 in | Wt 158.1 lb

## 2015-01-11 DIAGNOSIS — R002 Palpitations: Secondary | ICD-10-CM

## 2015-01-11 DIAGNOSIS — I10 Essential (primary) hypertension: Secondary | ICD-10-CM

## 2015-01-11 DIAGNOSIS — I639 Cerebral infarction, unspecified: Secondary | ICD-10-CM | POA: Diagnosis not present

## 2015-01-11 MED ORDER — TAMSULOSIN HCL 0.4 MG PO CAPS: 0.4000 mg | ORAL_CAPSULE | Freq: Every day | ORAL | Status: DC

## 2015-01-11 MED ORDER — ZOLPIDEM TARTRATE 5 MG PO TABS: 5.0000 mg | ORAL_TABLET | Freq: Every evening | ORAL | Status: DC | PRN

## 2015-01-11 NOTE — Progress Notes (Signed)
   Subjective:    Patient ID: Randy [name], male    DOB: February 19, 1929, 81 y.o.   MRN: [id number]  Dizziness This is a new problem. The current episode started 1 to 4 weeks ago. The problem has been gradually improving. Pertinent negatives include no abdominal pain, chest pain, congestion, coughing, diaphoresis, fatigue or fever.    Patient being seen today for a hospital stay at [facility] on 6/29- 7/1. Patient states that he intial went to the ER for vertigo like symptoms. Patient has several changes to medications from outside Doctor to discuss this visit.Patient states no concerns.  Review of Systems  Constitutional: Negative for fever, diaphoresis and fatigue.  HENT: Negative for congestion.   Respiratory: Negative for cough and choking.   Cardiovascular: Positive for palpitations. Negative for chest pain.  Gastrointestinal: Negative for abdominal pain.  Neurological: Positive for dizziness.       Objective:   Physical Exam  Constitutional: He appears well-nourished. No distress.  Cardiovascular: Normal rate and normal heart sounds.   No murmur heard. Pulmonary/Chest: Effort normal and breath sounds normal. No respiratory distress.  Musculoskeletal: He exhibits no edema.  Lymphadenopathy:    He has no cervical adenopathy.  Neurological: He is alert.  Psychiatric: His behavior is normal.  Vitals reviewed.         Assessment & Plan:  1. Palpitations His EKG does show a few extra PACs but does not show any pathologic conditionif this gets worse after stopping metoprolol may need to reinitiate a lower dose neurologist recommended stopping metoprolol and trazodone because they felt that may have caused some of the symptoms at put him in the hospital  2. Essential hypertension, benign Blood pressure under good control continue current measures patient is follow-up in 4-6 weeks for recheck

## 2015-01-16 ENCOUNTER — Ambulatory Visit (INDEPENDENT_AMBULATORY_CARE_PROVIDER_SITE_OTHER): Payer: Medicare Other | Admitting: Family Medicine

## 2015-01-16 ENCOUNTER — Encounter: Payer: Self-pay | Admitting: Gastroenterology

## 2015-01-16 ENCOUNTER — Encounter: Payer: Self-pay | Admitting: Family Medicine

## 2015-01-16 VITALS — BP 138/86 | Ht 69.0 in | Wt 153.0 lb

## 2015-01-16 DIAGNOSIS — K222 Esophageal obstruction: Secondary | ICD-10-CM

## 2015-01-16 DIAGNOSIS — I951 Orthostatic hypotension: Secondary | ICD-10-CM

## 2015-01-16 DIAGNOSIS — I639 Cerebral infarction, unspecified: Secondary | ICD-10-CM

## 2015-01-16 DIAGNOSIS — R634 Abnormal weight loss: Secondary | ICD-10-CM | POA: Diagnosis not present

## 2015-01-16 DIAGNOSIS — N400 Enlarged prostate without lower urinary tract symptoms: Secondary | ICD-10-CM | POA: Diagnosis not present

## 2015-01-16 NOTE — Patient Instructions (Signed)

## 2015-01-16 NOTE — Progress Notes (Signed)
   Subjective:    Patient ID: Randy [name], male    DOB: 21-May-1929, 81 y.o.   MRN: [id number]  HPIpt arrives for follow up with daughter Randy [name]  Pt needs flomax increased to 2 qhs.   Pt concerned about blood pressure. Pt states he felt better while taking metoprolol med. Was taking one half bid while in the hospital. Pt took last of metoprolol yesterday.   Patient denies being anxious does not feel that that is his problem. He was recently at urology they told him everything with his prostate was doing good  Patient does relate at times feel like things get stuck in his throat more when he swallows. He has lost a little bit of weight. He thinks that the weight loss is related to all that he is been through recently. It should be noted that he also lost his wife within the past 6 months  Review of Systems Patient describes himself as feeling somewhat dizzy. He was in the hospital recently visited quite a bit of testing they took him off of trazodone metoprolol and reduced his Flomax he states since then he has not been having much success at urinating. He is requesting that the medication be changed back    Objective:   Physical Exam Lungs are clear slight tachycardia abdomen is soft extremities no edema HEENT is benign Blood pressure was taken laying sitting standing there is an appreciable drop in blood pressure between laying and standing       Assessment & Plan:  1. Esophageal stricture I believe this patient is developing an esophageal stricture in her palms and more so with solids and with pills he also has lost a little bit overweight although some could be stress related will refer to gastroenterology for further evaluation and possible EGD  2. Loss of weight Some weight loss but this could be related to stress issues recheck in 4 weeks  3. Orthostatic hypotension Blood pressure does drop when he goes from laying sitting standing I do not believe that he should be on 2 pills  of Flomax every night but he states when he uses one he has difficult time urinating. I would recommend that he be seen again by [facility] and they see if there is anything different they could use other than 2 pills of Flomax. Possibility he may need surgery.  4. BPH (benign prostatic hyperplasia) Please see discussion above.  Today's visit was 30 minutes greater than half in discussion

## 2015-01-20 ENCOUNTER — Encounter: Payer: Self-pay | Admitting: Family Medicine

## 2015-01-24 ENCOUNTER — Ambulatory Visit (INDEPENDENT_AMBULATORY_CARE_PROVIDER_SITE_OTHER): Payer: Medicare Other | Admitting: Urology

## 2015-01-24 DIAGNOSIS — N401 Enlarged prostate with lower urinary tract symptoms: Secondary | ICD-10-CM | POA: Diagnosis not present

## 2015-01-24 DIAGNOSIS — N320 Bladder-neck obstruction: Secondary | ICD-10-CM | POA: Diagnosis not present

## 2015-02-01 ENCOUNTER — Ambulatory Visit (HOSPITAL_COMMUNITY): Payer: Medicare Other | Admitting: Physical Therapy

## 2015-02-02 ENCOUNTER — Ambulatory Visit (INDEPENDENT_AMBULATORY_CARE_PROVIDER_SITE_OTHER): Payer: Medicare Other | Admitting: Gastroenterology

## 2015-02-02 ENCOUNTER — Telehealth: Payer: Self-pay | Admitting: Gastroenterology

## 2015-02-02 ENCOUNTER — Encounter: Payer: Self-pay | Admitting: Gastroenterology

## 2015-02-02 VITALS — BP 116/60 | HR 82 | Ht 68.5 in | Wt 156.0 lb

## 2015-02-02 DIAGNOSIS — R1310 Dysphagia, unspecified: Secondary | ICD-10-CM

## 2015-02-02 MED ORDER — OMEPRAZOLE 40 MG PO CPDR: 40.0000 mg | DELAYED_RELEASE_CAPSULE | Freq: Every day | ORAL | Status: DC

## 2015-02-02 NOTE — Progress Notes (Signed)
_                                                                                                                History of Present Illness:  Randy [name] is a pleasant 81 year old white male referred at the request of Dr. Wolfgang Phoenix for evaluation of dysphagia.  He complains of dysphagia to solids.  Is occasional pyrosis.  He went a swallow evaluation that showed minimal aspiration.  No specific recommendations were made.  Weight has been stable.  He probably had a colonoscopy 4-5 years ago which was unremarkable.  Past Medical History  Diagnosis Date  . History of BPH   . DJD (degenerative joint disease)     cervical  . GERD (gastroesophageal reflux disease)     takes prilosec intermittently  . Anxiety     claustraphobia  . BNC (bladder neck contracture)   . Dysuria OCCASIONAL  . Urinary hesitancy   . Cervical spondylosis   . Hiatal hernia   . COPD (chronic obstructive pulmonary disease)   . Impaired fasting glucose   . Claustrophobia   . Hypertension   . Complication of anesthesia     claustraphobia  . Hx of bladder cancer   . Status post dilation of esophageal narrowing    Past Surgical History  Procedure Laterality Date  . Retinal detachment surgery  1988  . Cardiovascular stress test  11-08-2009  DR ROTHBART    LOW RISK MYOVIEW/ NO ISCHEMIA/ LVEF 64%  . Transurethral resection of bladder tumor  01/06/2012    Procedure: TRANSURETHRAL RESECTION OF BLADDER TUMOR (TURBT);  Surgeon: Franchot Gallo, MD;  Location: [facility];  Service: Urology;  Laterality: N/A;  . Transurethral resection of prostate    . Laparoscopic repair large hiatal hernia w/ mesh and nissen fundoplication  [id number]  . Cholecystectomy  1984    OPEN  . Cataract extraction w/ intraocular lens  implant, bilateral    . Transurethral resection of bladder neck  05/01/2012    Procedure: TRANSURETHRAL RESECTION OF BLADDER NECK;  Surgeon: Franchot Gallo, MD;  Location: [facility];  Service: Urology;  Laterality: N/A;  [name]   . Collapsed lung  1965  . Transurethral resection of prostate    . Prostate biopsy N/A 11/17/2012    Procedure: BIOPSY TRANSRECTAL ULTRASONIC PROSTATE (TUBP);  Surgeon: Franchot Gallo, MD;  Location: [facility];  Service: Urology;  Laterality: N/A;  45 MINS 1. TRANSRECTAL ULTRASOUND OF PROSTATE WITH BIOPSY -will need ultrasound tech 2.CYSTOSCOPY, BLADDER BIOPSY - need resectoscope- not Gyrus [id number] Medicare -[id number] BCBS - [id number]  . Cystoscopy with biopsy N/A 11/17/2012    Procedure: CYSTOSCOPY WITH BIOPSY;  Surgeon: Franchot Gallo, MD;  Location: [facility];  Service: Urology;  Laterality: N/A;  . Reposition of lens Right 03/31/2014    Procedure: REPOSITIONING OF INTRAOCULAR LENS IMPLANT RIGHT EYE;  Surgeon: Tonny Branch, MD;  Location: [facility];  Service: Ophthalmology;  Laterality: Right;   family history  includes Heart disease in his brother; Kidney failure in his father; Stroke in his mother. Current Outpatient Prescriptions  Medication Sig Dispense Refill  . ALPRAZolam (XANAX) 0.5 MG tablet TAKE 1/2 TO 1 TABLET TWICE DAILY. 60 tablet 4  . fluticasone (FLONASE) 50 MCG/ACT nasal spray USE 2 SPRAYS IN NOSTRIL DAILY 16 g 5  . tamsulosin (FLOMAX) 0.4 MG CAPS capsule Take 1 capsule (0.4 mg total) by mouth at bedtime. 60 capsule 5  . traMADol (ULTRAM) 50 MG tablet Take 1 tablet (50 mg total) by mouth 3 (three) times daily as needed. (Patient taking differently: Take 50 mg by mouth 3 (three) times daily as needed for moderate pain. ) 90 tablet 5   No current facility-administered medications for this visit.   Allergies as of 02/02/2015 - Review Complete 02/02/2015  Allergen Reaction Noted  . Trazodone and nefazodone Other (See Comments) 01/11/2015    reports that he quit smoking about 28 years ago. His smoking use included Cigarettes. He has a 40 pack-year smoking history.  He has never used smokeless tobacco. He reports that he does not drink alcohol or use illicit drugs.   Review of Systems: He complains of episodic episodes of dizziness or lightheadedness Pertinent positive and negative review of systems were noted in the above HPI section. All other review of systems were otherwise negative.  Vital signs were reviewed in today's medical record Physical Exam: General: Well developed , well nourished, no acute distress Skin: anicteric Head: Normocephalic and atraumatic Eyes:  sclerae anicteric, EOMI Ears: Normal auditory acuity Mouth: No deformity or lesions Neck: Supple, no masses or thyromegaly Lymph Nodes: no lymphadenopathy Lungs: Clear throughout to auscultation Heart: Regular rate and rhythm; no murmurs, rubs or bruits Gastroinestinal: Soft, non tender and non distended. No masses, hepatosplenomegaly or hernias noted. Normal Bowel sounds Rectal:deferred Musculoskeletal: Symmetrical with no gross deformities  Skin: No lesions on visible extremities Pulses:  Normal pulses noted Extremities: No clubbing, cyanosis, edema or deformities noted Neurological: Alert oriented x 4, grossly nonfocal Cervical Nodes:  No significant cervical adenopathy Inguinal Nodes: No significant inguinal adenopathy Psychological:  Alert and cooperative. Normal mood and affect  See Assessment and Plan under Problem List

## 2015-02-02 NOTE — Assessment & Plan Note (Signed)
Dysphagia to solids-suspect peptic esophageal stricture.  He also has a history of GERD.    Recommendations #1 EGD with dilation as indicated #2 begin omeprazole 20 mg daily  CC Dr.Luking

## 2015-02-02 NOTE — Telephone Encounter (Signed)
Called pt and L/m that med was sent

## 2015-02-02 NOTE — Patient Instructions (Signed)

## 2015-02-10 ENCOUNTER — Ambulatory Visit (INDEPENDENT_AMBULATORY_CARE_PROVIDER_SITE_OTHER): Payer: Medicare Other | Admitting: Family Medicine

## 2015-02-10 ENCOUNTER — Encounter: Payer: Self-pay | Admitting: Family Medicine

## 2015-02-10 VITALS — BP 108/72 | Ht 69.0 in | Wt 157.2 lb

## 2015-02-10 DIAGNOSIS — R002 Palpitations: Secondary | ICD-10-CM

## 2015-02-10 DIAGNOSIS — I951 Orthostatic hypotension: Secondary | ICD-10-CM

## 2015-02-10 DIAGNOSIS — N400 Enlarged prostate without lower urinary tract symptoms: Secondary | ICD-10-CM

## 2015-02-10 DIAGNOSIS — I639 Cerebral infarction, unspecified: Secondary | ICD-10-CM

## 2015-02-10 MED ORDER — TRAMADOL HCL 50 MG PO TABS: 50.0000 mg | ORAL_TABLET | Freq: Three times a day (TID) | ORAL | Status: DC | PRN

## 2015-02-10 MED ORDER — TAMSULOSIN HCL 0.4 MG PO CAPS: 0.8000 mg | ORAL_CAPSULE | Freq: Every day | ORAL | Status: DC

## 2015-02-10 MED ORDER — ALPRAZOLAM 0.5 MG PO TABS: ORAL_TABLET | ORAL | Status: DC

## 2015-02-10 MED ORDER — METOPROLOL TARTRATE 25 MG PO TABS: ORAL_TABLET | ORAL | Status: DC

## 2015-02-10 NOTE — Progress Notes (Signed)
   Subjective:    Patient ID: Randy [name], male    DOB: 17-Mar-1929, 81 y.o.   MRN: [id number]  HPI  Patient arrives for a follow up on weight loss. Patient has gained 4 lbs but his appetite still not like before. Patient having EGD and stretching procedure 03/23/15. Also working with urology about flomax.  this patient does state when he does not take Flomax 2 tablets nightly he has problems with urination he tried cutting back the medicine but was unable to the urologist told him that they could do a procedure that would help him urinate the patient also relates that the metoprolol half tablet a day seems to help his palpitations helps him feel better. He also wanted permission to take omeprazole to help his stomach and reflux issues we gave him this permission today he also discussed the EGD that is coming up. In addition to this he stated that pain medications seem to be helping him he denies abusing it. Review of Systems  Constitutional: Negative for fever and fatigue.  HENT: Negative for congestion.   Cardiovascular: Negative for chest pain.  Gastrointestinal: Negative for nausea, vomiting and abdominal distention.  Genitourinary: Positive for difficulty urinating.       Objective:   Physical Exam  Constitutional: He appears well-nourished. No distress.  Cardiovascular: Normal rate, regular rhythm and normal heart sounds.   No murmur heard. Pulmonary/Chest: Effort normal and breath sounds normal. No respiratory distress.  Musculoskeletal: He exhibits no edema.  Lymphadenopathy:    He has no cervical adenopathy.  Neurological: He is alert.  Psychiatric: His behavior is normal.  Vitals reviewed.         Assessment & Plan:   very interesting patient  BPH- this patient would benefit from a urologic procedure in order to help him get to the point where he can cut down on the amount of Flomax is taking this patient had orthostatic hypotension related to excessive medication    Orthostatic hypotension he was cautioned to a be careful so he does not potentially fall out  Dysphagia he will be getting procedure from gastroenterology hopefully this will help him  His weight is actually doing better  patient has palpitations he is using metoprolol half tablet currently I think that would be fine to continue   occasional anxiety issues Xanax when necessary  joint pains and discomfort uses tramadol seems to do well with this   regular and 25 minutes was spent with patient discussing multiple issues answering multiple questions greater than half in discussion

## 2015-02-28 ENCOUNTER — Ambulatory Visit (INDEPENDENT_AMBULATORY_CARE_PROVIDER_SITE_OTHER): Payer: Medicare Other | Admitting: Urology

## 2015-02-28 DIAGNOSIS — N400 Enlarged prostate without lower urinary tract symptoms: Secondary | ICD-10-CM | POA: Diagnosis not present

## 2015-02-28 DIAGNOSIS — N320 Bladder-neck obstruction: Secondary | ICD-10-CM | POA: Diagnosis not present

## 2015-02-28 DIAGNOSIS — D090 Carcinoma in situ of bladder: Secondary | ICD-10-CM | POA: Diagnosis not present

## 2015-03-23 ENCOUNTER — Ambulatory Visit (AMBULATORY_SURGERY_CENTER): Payer: Medicare Other | Admitting: Gastroenterology

## 2015-03-23 ENCOUNTER — Encounter: Payer: Self-pay | Admitting: Gastroenterology

## 2015-03-23 VITALS — BP 129/70 | HR 80 | Temp 98.0°F | Resp 18 | Ht 68.0 in | Wt 156.0 lb

## 2015-03-23 DIAGNOSIS — R1310 Dysphagia, unspecified: Secondary | ICD-10-CM | POA: Diagnosis not present

## 2015-03-23 DIAGNOSIS — K222 Esophageal obstruction: Secondary | ICD-10-CM

## 2015-03-23 DIAGNOSIS — I10 Essential (primary) hypertension: Secondary | ICD-10-CM | POA: Diagnosis not present

## 2015-03-23 DIAGNOSIS — J449 Chronic obstructive pulmonary disease, unspecified: Secondary | ICD-10-CM | POA: Diagnosis not present

## 2015-03-23 MED ORDER — SODIUM CHLORIDE 0.9 % IV SOLN: 500.0000 mL | INTRAVENOUS | Status: DC

## 2015-03-23 NOTE — Patient Instructions (Signed)
YOU HAD AN ENDOSCOPIC PROCEDURE TODAY AT [facility]:   Refer to the procedure report that was given to you for any specific questions about what was found during the examination.  If the procedure report does not answer your questions, please call your gastroenterologist to clarify.  If you requested that your care partner not be given the details of your procedure findings, then the procedure report has been included in a sealed envelope for you to review at your convenience later.  YOU SHOULD EXPECT: Some feelings of bloating in the abdomen. Passage of more gas than usual.  Walking can help get rid of the air that was put into your GI tract during the procedure and reduce the bloating. If you had a lower endoscopy (such as a colonoscopy or flexible sigmoidoscopy) you may notice spotting of blood in your stool or on the toilet paper. If you underwent a bowel prep for your procedure, you may not have a normal bowel movement for a few days.  Please Note:  You might notice some irritation and congestion in your nose or some drainage.  This is from the oxygen used during your procedure.  There is no need for concern and it should clear up in a day or so.  SYMPTOMS TO REPORT IMMEDIATELY:    Following upper endoscopy (EGD)  Vomiting of blood or coffee ground material  New chest pain or pain under the shoulder blades  Painful or persistently difficult swallowing  New shortness of breath  Fever of 100F or higher  Black, tarry-looking stools  For urgent or emergent issues, a gastroenterologist can be reached at any hour by calling [phone].   DIET:Follow Dilation Diet  ACTIVITY:  You should plan to take it easy for the rest of today and you should NOT DRIVE or use heavy machinery until tomorrow (because of the sedation medicines used during the test).    FOLLOW UP: Our staff will call the number listed on your records the next business day following your procedure to check  on you and address any questions or concerns that you may have regarding the information given to you following your procedure. If we do not reach you, we will leave a message.  However, if you are feeling well and you are not experiencing any problems, there is no need to return our call.  We will assume that you have returned to your regular daily activities without incident.  If any biopsies were taken you will be contacted by phone or by letter within the next 1-3 weeks.  Please call us at [phone] if you have not heard about the biopsies in 3 weeks.    SIGNATURES/CONFIDENTIALITY: You and/or your care partner have signed paperwork which will be entered into your electronic medical record.  These signatures attest to the fact that that the information above on your After Visit Summary has been reviewed and is understood.  Full responsibility of the confidentiality of this discharge information lies with you and/or your care-partner.

## 2015-03-23 NOTE — Progress Notes (Signed)
Transferred to recovery room. A/O x3, pleased with MAC.  VSS.  Report to Jill, RN. 

## 2015-03-23 NOTE — Op Note (Signed)
[place]  [address] [place], [id number]   ENDOSCOPY PROCEDURE REPORT  PATIENT: Randy [name], Randy [name]  MR#: [id number] BIRTHDATE: December 08, 1928 , 32  yrs. old GENDER: male ENDOSCOPIST: Inda Castle, MD REFERRED BY:  Sallee Lange, M.D. PROCEDURE DATE:  03/23/2015 PROCEDURE:  EGD, diagnostic and Maloney dilation of esophagus ASA CLASS:     Class II INDICATIONS:  dysphagia. MEDICATIONS: Monitored anesthesia care and Propofol 120 mg IV TOPICAL ANESTHETIC:  DESCRIPTION OF PROCEDURE: After the risks benefits and alternatives of the procedure were thoroughly explained, informed consent was obtained.  The LB [id number] endoscope was introduced through the mouth and advanced to the second portion of the duodenum , Without limitations.  The instrument was slowly withdrawn as the mucosa was fully examined.    ESOPHAGUS: There was a peptic stricture at the gastroesophageal junction.  The stricture was traversable.  The stricture was dilated using a 17.7mm (52Fr) Maloney dilator. There was moderate resistance. Following this dilation, there was a small amount of heme and a small mucosal rent.     STOMACH: A 3 cm hiatal hernia was noted. The exam was otherwise normal Retroflexed views revealed no abnormalities.     The scope was then withdrawn from the patient and the procedure completed.  COMPLICATIONS: There were no immediate complications.  ENDOSCOPIC IMPRESSION: esophageal stricture?"status post Venia Minks dilation  RECOMMENDATIONS:   REPEAT EXAM:  eSigned:  Inda Castle, MD 03/23/2015 3:14 PM    CC:

## 2015-03-23 NOTE — Progress Notes (Signed)
Called to room to assist during endoscopic procedure.  Patient ID and intended procedure confirmed with present staff. Received instructions for my participation in the procedure from the performing physician.  

## 2015-03-24 ENCOUNTER — Telehealth: Payer: Self-pay | Admitting: *Deleted

## 2015-03-24 NOTE — Telephone Encounter (Signed)
  Follow up Call-  Call back number 03/23/2015  Post procedure Call Back phone  # [phone] hm  Permission to leave phone message Yes     Patient questions:  Do you have a fever, pain , or abdominal swelling? No. Pain Score  0 *  Have you tolerated food without any problems? Yes.    Have you been able to return to your normal activities? Yes.    Do you have any questions about your discharge instructions: Diet   No. Medications  No. Follow up visit  No.  Do you have questions or concerns about your Care? No.  Actions: * If pain score is 4 or above: No action needed, pain <4.

## 2015-03-28 ENCOUNTER — Encounter: Payer: Self-pay | Admitting: Family Medicine

## 2015-03-28 ENCOUNTER — Ambulatory Visit (INDEPENDENT_AMBULATORY_CARE_PROVIDER_SITE_OTHER): Payer: Medicare Other | Admitting: Family Medicine

## 2015-03-28 ENCOUNTER — Ambulatory Visit: Payer: Medicare Other | Admitting: Family Medicine

## 2015-03-28 ENCOUNTER — Encounter (HOSPITAL_COMMUNITY): Payer: Self-pay | Admitting: Emergency Medicine

## 2015-03-28 ENCOUNTER — Emergency Department (HOSPITAL_COMMUNITY)
Admission: EM | Admit: 2015-03-28 | Discharge: 2015-03-28 | Disposition: A | Discharge status: Home / Self Care | Payer: Medicare Other | Attending: Emergency Medicine | Admitting: Emergency Medicine

## 2015-03-28 DIAGNOSIS — N400 Enlarged prostate without lower urinary tract symptoms: Secondary | ICD-10-CM

## 2015-03-28 DIAGNOSIS — F419 Anxiety disorder, unspecified: Secondary | ICD-10-CM | POA: Insufficient documentation

## 2015-03-28 DIAGNOSIS — R42 Dizziness and giddiness: Secondary | ICD-10-CM | POA: Diagnosis not present

## 2015-03-28 DIAGNOSIS — I951 Orthostatic hypotension: Secondary | ICD-10-CM | POA: Diagnosis not present

## 2015-03-28 DIAGNOSIS — K219 Gastro-esophageal reflux disease without esophagitis: Secondary | ICD-10-CM | POA: Diagnosis not present

## 2015-03-28 DIAGNOSIS — Z87891 Personal history of nicotine dependence: Secondary | ICD-10-CM | POA: Diagnosis not present

## 2015-03-28 DIAGNOSIS — Z79899 Other long term (current) drug therapy: Secondary | ICD-10-CM | POA: Diagnosis not present

## 2015-03-28 DIAGNOSIS — F411 Generalized anxiety disorder: Secondary | ICD-10-CM

## 2015-03-28 DIAGNOSIS — R200 Anesthesia of skin: Secondary | ICD-10-CM | POA: Insufficient documentation

## 2015-03-28 DIAGNOSIS — I10 Essential (primary) hypertension: Secondary | ICD-10-CM

## 2015-03-28 DIAGNOSIS — I639 Cerebral infarction, unspecified: Secondary | ICD-10-CM | POA: Diagnosis not present

## 2015-03-28 DIAGNOSIS — Z87438 Personal history of other diseases of male genital organs: Secondary | ICD-10-CM | POA: Diagnosis not present

## 2015-03-28 DIAGNOSIS — R5383 Other fatigue: Secondary | ICD-10-CM | POA: Diagnosis not present

## 2015-03-28 DIAGNOSIS — Z8551 Personal history of malignant neoplasm of bladder: Secondary | ICD-10-CM | POA: Diagnosis not present

## 2015-03-28 DIAGNOSIS — J449 Chronic obstructive pulmonary disease, unspecified: Secondary | ICD-10-CM | POA: Diagnosis not present

## 2015-03-28 NOTE — ED Provider Notes (Signed)
CSN: [id number]     Arrival date & time 03/28/15  1244 History   First MD Initiated Contact with Patient 03/28/15 1302     Chief Complaint  Patient presents with  . Dizziness     (Consider location/radiation/quality/duration/timing/severity/associated sxs/prior Treatment) HPI..... Patient presents with vague complaint of lightheadedness when he stood up. He also has generalized numbness. He feels he is at baseline. The symptoms happen occasionally, but often resolved. No substernal chest pain, dyspnea, fever, chills, neuro deficits, dysuria, cough.  He has an appointment this afternoon with his primary care physician  Past Medical History  Diagnosis Date  . History of BPH   . DJD (degenerative joint disease)     cervical  . GERD (gastroesophageal reflux disease)     takes prilosec intermittently  . Anxiety     claustraphobia  . BNC (bladder neck contracture)   . Dysuria OCCASIONAL  . Urinary hesitancy   . Cervical spondylosis   . Hiatal hernia   . COPD (chronic obstructive pulmonary disease)   . Impaired fasting glucose   . Claustrophobia   . Hypertension   . Complication of anesthesia     claustraphobia  . Hx of bladder cancer   . Status post dilation of esophageal narrowing   . Allergy   . Cancer     bladder cancer   Past Surgical History  Procedure Laterality Date  . Retinal detachment surgery  1988  . Cardiovascular stress test  11-08-2009  DR ROTHBART    LOW RISK MYOVIEW/ NO ISCHEMIA/ LVEF 64%  . Transurethral resection of bladder tumor  01/06/2012    Procedure: TRANSURETHRAL RESECTION OF BLADDER TUMOR (TURBT);  Surgeon: Franchot Gallo, MD;  Location: [facility];  Service: Urology;  Laterality: N/A;  . Transurethral resection of prostate    . Laparoscopic repair large hiatal hernia w/ mesh and nissen fundoplication  [id number]  . Cholecystectomy  1984    OPEN  . Cataract extraction w/ intraocular lens  implant, bilateral    . Transurethral  resection of bladder neck  05/01/2012    Procedure: TRANSURETHRAL RESECTION OF BLADDER NECK;  Surgeon: Franchot Gallo, MD;  Location: [facility];  Service: Urology;  Laterality: N/A;  [place]   . Collapsed lung  1965  . Transurethral resection of prostate    . Prostate biopsy N/A 11/17/2012    Procedure: BIOPSY TRANSRECTAL ULTRASONIC PROSTATE (TUBP);  Surgeon: Franchot Gallo, MD;  Location: [facility];  Service: Urology;  Laterality: N/A;  45 MINS 1. TRANSRECTAL ULTRASOUND OF PROSTATE WITH BIOPSY -will need ultrasound tech 2.CYSTOSCOPY, BLADDER BIOPSY - need resectoscope- not Gyrus [id number] Medicare -[id number] BCBS - [id number]  . Cystoscopy with biopsy N/A 11/17/2012    Procedure: CYSTOSCOPY WITH BIOPSY;  Surgeon: Franchot Gallo, MD;  Location: [facility];  Service: Urology;  Laterality: N/A;  . Reposition of lens Right 03/31/2014    Procedure: REPOSITIONING OF INTRAOCULAR LENS IMPLANT RIGHT EYE;  Surgeon: Tonny Branch, MD;  Location: [facility];  Service: Ophthalmology;  Laterality: Right;   Family History  Problem Relation Age of Onset  . Stroke Mother   . Kidney failure Father   . Heart disease Brother   . Colon cancer Neg Hx   . Esophageal cancer Neg Hx   . Rectal cancer Neg Hx   . Stomach cancer Neg Hx    Social History  Substance Use Topics  . Smoking status: Former Smoker -- 1.00 packs/day for 40 years  Types: Cigarettes    Quit date: 12/26/1986  . Smokeless tobacco: Never Used  . Alcohol Use: No    Review of Systems  All other systems reviewed and are negative.     Allergies  Trazodone and nefazodone  Home Medications   Prior to Admission medications   Medication Sig Start Date End Date Taking? Authorizing Provider  acetaminophen (TYLENOL) 325 MG tablet Take 325 mg by mouth every 6 (six) hours as needed.   Yes Historical Provider, MD  ALPRAZolam (XANAX) 0.5 MG tablet TAKE 1/2 TO 1 TABLET TWICE  DAILY. 02/10/15  Yes Kathyrn Drown, MD  omeprazole (PRILOSEC) 40 MG capsule Take 1 capsule (40 mg total) by mouth daily. 02/02/15  Yes Inda Castle, MD  tamsulosin (FLOMAX) 0.4 MG CAPS capsule Take 2 capsules (0.8 mg total) by mouth at bedtime. 02/10/15  Yes Kathyrn Drown, MD  traMADol (ULTRAM) 50 MG tablet Take 1 tablet (50 mg total) by mouth 3 (three) times daily as needed. 02/10/15  Yes Scott A Luking, MD   BP 148/101 mmHg  Pulse 95  Temp(Src) 97.9 F (36.6 C) (Oral)  Resp 16  Ht 5\' 8"  (1.727 m)  Wt 155 lb (70.308 kg)  BMI 23.57 kg/m2  SpO2 95% Physical Exam  Constitutional: He is oriented to person, place, and time. He appears well-developed and well-nourished.  Patient is ambulatory without obvious deficits  HENT:  Head: Normocephalic and atraumatic.  Eyes: Conjunctivae and EOM are normal. Pupils are equal, round, and reactive to light.  Neck: Normal range of motion. Neck supple.  Cardiovascular: Normal rate and regular rhythm.   Pulmonary/Chest: Effort normal and breath sounds normal.  Abdominal: Soft. Bowel sounds are normal.  Musculoskeletal: Normal range of motion.  Neurological: He is alert and oriented to person, place, and time.  Skin: Skin is warm and dry.  Psychiatric: He has a normal mood and affect. His behavior is normal.  Nursing note and vitals reviewed.   ED Course  Procedures (including critical care time) Labs Review Labs Reviewed - No data to display  Imaging Review No results found. I have personally reviewed and evaluated these images and lab results as part of my medical decision-making.   EKG Interpretation   Date/Time:  Tuesday March 28 2015 12:52:04 EDT Ventricular Rate:  102 PR Interval:  194 QRS Duration: 88 QT Interval:  399 QTC Calculation: 520 R Axis:   39 Text Interpretation:  Sinus tachycardia Multiple premature complexes, vent   LAE, consider biatrial enlargement Probable anteroseptal infarct, old  Borderline T  abnormalities, inferior leads Prolonged QT interval Confirmed  by Lacinda Axon  MD, BRIAN ([id number]) on 03/28/2015 1:38:04 PM      MDM   Final diagnoses:  Numbness    Patient alert and oriented. No obvious neuro deficits. He has a long-standing primary care relationship and an appointment this afternoon. Medical screening  suggests no life-threatening condition.    Nat Christen, MD 03/31/15 [phone]

## 2015-03-28 NOTE — Progress Notes (Signed)
   Subjective:    Patient ID: Randy [name], male    DOB: 01/16/1929, 82 y.o.   MRN: [id number]  HPI  Patient in today for fatigue. Patient states he feels tired, SOB, blurred vision, numbness from chest up, headache.  Patient states he feels at times anxious at times xanax helps He relates joint pains and discomfort in the tramadol 3 times a day helps he denies feeling tried by the medication. He does state at times dizzy when he stands up. SOB with activity.  Patient states tramadol, and xanax helps. Patient seen after hours upon request Patient had recent ER visit today at [facility]. He was seen in ER for fatigue and dizziness He relates ongoing tiredness and fatigue more so when he tries to get up and do things he denies chest pressure with activity but he doesn't that he does not walk much he does get a little short of breath with some activity. Review of Systems Patient denies any chest pressure tightness shortness breath nausea vomiting diarrhea. States he feels unsteady especially when he gets up and tries to move around. Denies any headache with this. Denies fever chills states he doesn't always eat as well as he should but he is maintaining his weight   25 minutes spent with patient discussing multiple different issues adjustments on medicine ordering tests in planning a follow-up Objective:   Physical Exam On examination his lungs are clear hearts regular pulse normal blood pressure was checked laying sitting and standing does have some now dropped to it finger to nose is normal Romberg negative EOM are normal neck no masses       Assessment & Plan:  Patient with recent hypocalcemia recheck lab work Significant screen for thyroid dysfunction Patient is to stopmetoprolol follow-up within 2 weeks to recheck blood pressure laying sitting standing with office visit  BPH doing well with Flomax Significant anxiety/generalized anxiety disorderXanax not to be used more than 2 tablets  per day caution drowsiness he states it does not cause drowsiness his daughter was here today to discuss his medications they state he is not abusing it and never appears under the influence Chronic orthopedic pain tramadol 3 times a day If patient not doing better on follow-up we will check sedimentation rate and CPK  May need referral to neurology upon follow-up depending on how he is doing

## 2015-03-28 NOTE — ED Notes (Signed)
Patient with no complaints at this time. Respirations even and unlabored. Skin warm/dry. Discharge instructions reviewed with patient at this time. Patient given opportunity to voice concerns/ask questions. Patient discharged at this time and left Emergency Department with steady gait.   

## 2015-03-28 NOTE — Discharge Instructions (Signed)
Follow-up your primary care doctor this afternoon.

## 2015-03-28 NOTE — ED Notes (Signed)
Pt reports lightheadedness and nausea. Pt has hx of dizziness. Med adjustments on last admission. Pt reports symptoms have been worse since Sat.

## 2015-04-03 ENCOUNTER — Telehealth: Payer: Self-pay | Admitting: Gastroenterology

## 2015-04-03 DIAGNOSIS — R5383 Other fatigue: Secondary | ICD-10-CM | POA: Diagnosis not present

## 2015-04-03 DIAGNOSIS — I951 Orthostatic hypotension: Secondary | ICD-10-CM | POA: Diagnosis not present

## 2015-04-03 NOTE — Telephone Encounter (Signed)
I have left message for the patient to call back 

## 2015-04-03 NOTE — Telephone Encounter (Signed)
The patient wants to see Dr Deatra Ina again. He feels his swallowing is not better. He states he thought it would improve after the dilation but it "just didn't." He also states "I am 87 and there may not be anything else that can be done". Appointment made to see Dr Deatra Ina.

## 2015-04-04 LAB — CBC WITH DIFFERENTIAL/PLATELET
BASOS ABS: 0 10*3/uL (ref 0.0–0.2)
Basos: 0 %
EOS (ABSOLUTE): 0.3 10*3/uL (ref 0.0–0.4)
Eos: 3 %
HEMATOCRIT: 47.3 % (ref 37.5–51.0)
HEMOGLOBIN: 16.4 g/dL (ref 12.6–17.7)
Immature Grans (Abs): 0 10*3/uL (ref 0.0–0.1)
Immature Granulocytes: 0 %
LYMPHS ABS: 2 10*3/uL (ref 0.7–3.1)
Lymphs: 26 %
MCH: 30.3 pg (ref 26.6–33.0)
MCHC: 34.7 g/dL (ref 31.5–35.7)
MCV: 87 fL (ref 79–97)
MONOCYTES: 12 %
MONOS ABS: 0.9 10*3/uL (ref 0.1–0.9)
NEUTROS ABS: 4.5 10*3/uL (ref 1.4–7.0)
Neutrophils: 59 %
Platelets: 185 10*3/uL (ref 150–379)
RBC: 5.41 x10E6/uL (ref 4.14–5.80)
RDW: 13.2 % (ref 12.3–15.4)
WBC: 7.7 10*3/uL (ref 3.4–10.8)

## 2015-04-04 LAB — BASIC METABOLIC PANEL
BUN / CREAT RATIO: 12 (ref 10–22)
BUN: 14 mg/dL (ref 8–27)
CO2: 28 mmol/L (ref 18–29)
CREATININE: 1.19 mg/dL (ref 0.76–1.27)
Calcium: 9.8 mg/dL (ref 8.6–10.2)
Chloride: 94 mmol/L — ABNORMAL LOW (ref 97–108)
GFR calc Af Amer: 64 mL/min/{1.73_m2} (ref >59)
GFR, EST NON AFRICAN AMERICAN: 56 mL/min/{1.73_m2} — AB (ref >59)
GLUCOSE: 100 mg/dL — AB (ref 65–99)
POTASSIUM: 4.9 mmol/L (ref 3.5–5.2)
SODIUM: 140 mmol/L (ref 134–144)

## 2015-04-04 LAB — TSH: TSH: 0.971 u[IU]/mL (ref 0.450–4.500)

## 2015-04-05 ENCOUNTER — Ambulatory Visit (INDEPENDENT_AMBULATORY_CARE_PROVIDER_SITE_OTHER): Payer: Medicare Other | Admitting: Gastroenterology

## 2015-04-05 ENCOUNTER — Encounter: Payer: Self-pay | Admitting: Gastroenterology

## 2015-04-05 VITALS — BP 142/88 | HR 80 | Ht 68.5 in | Wt 159.0 lb

## 2015-04-05 DIAGNOSIS — I639 Cerebral infarction, unspecified: Secondary | ICD-10-CM | POA: Diagnosis not present

## 2015-04-05 DIAGNOSIS — K219 Gastro-esophageal reflux disease without esophagitis: Secondary | ICD-10-CM

## 2015-04-05 DIAGNOSIS — R1310 Dysphagia, unspecified: Secondary | ICD-10-CM | POA: Diagnosis not present

## 2015-04-05 NOTE — Assessment & Plan Note (Signed)
Asymptomatic following dilation of a distal esophageal stricture.

## 2015-04-05 NOTE — Assessment & Plan Note (Signed)
Clearly has a requirement to take PPI.  He was instructed to restart it.

## 2015-04-05 NOTE — Patient Instructions (Signed)
Follow up as needed

## 2015-04-05 NOTE — Progress Notes (Signed)
      History of Present Illness:  Mr. Torbert is returned following dilation of a distal esophageal stricture.  Dysphagia is significantly improved.  Complains of dry mouth.  After stopping Prilosec he developed significant pyrosis.    Review of Systems: Pertinent positive and negative review of systems were noted in the above HPI section. All other review of systems were otherwise negative.    Current Medications, Allergies, Past Medical History, Past Surgical History, Family History and Social History were reviewed in [facility] record  Vital signs were reviewed in today's medical record. Physical Exam: General: Well developed , well nourished, no acute distress   See Assessment and Plan under Problem List

## 2015-04-11 ENCOUNTER — Ambulatory Visit (INDEPENDENT_AMBULATORY_CARE_PROVIDER_SITE_OTHER): Payer: Medicare Other | Admitting: Family Medicine

## 2015-04-11 ENCOUNTER — Encounter: Payer: Self-pay | Admitting: Family Medicine

## 2015-04-11 VITALS — BP 144/90 | Ht 68.5 in | Wt 158.0 lb

## 2015-04-11 DIAGNOSIS — R000 Tachycardia, unspecified: Secondary | ICD-10-CM

## 2015-04-11 DIAGNOSIS — I639 Cerebral infarction, unspecified: Secondary | ICD-10-CM | POA: Diagnosis not present

## 2015-04-11 DIAGNOSIS — Z23 Encounter for immunization: Secondary | ICD-10-CM

## 2015-04-11 DIAGNOSIS — I951 Orthostatic hypotension: Secondary | ICD-10-CM

## 2015-04-11 MED ORDER — METOPROLOL SUCCINATE ER 25 MG PO TB24: 25.0000 mg | ORAL_TABLET | Freq: Every day | ORAL | Status: DC

## 2015-04-11 NOTE — Progress Notes (Signed)
   Subjective:    Patient ID: Randy [name], male    DOB: [birth year], 82 y.o.   MRN: [id number]  HPIFollow up on bp. bp med was stopped.   Pt had to start back on omeprazole 40mg .   Still a little weak, nervous feeling. Patient denies being depressed.  Dizziness and nausea is better.   Driving local only, patient deals most comfortable driving locally he was advised as local driving  Continue omeprazolepatient states no reflux symptoms as long as he takes his medicine. Patient with some arthralgias uses tramadol when necessary for this  Review of Systems Patient denies dyspnea shortness of breath chest pressure pain denies swelling in legs no fever or chills    Objective:   Physical Exam Lungs clear heart slight tachycardia blood pressure laying sitting standing no appreciable change abdomen soft extremities no edema no crackles in the lungs       Assessment & Plan:  HTN decent control Tachycardia reestablish metoprolol extended release 25 mg daily Anxiety issues Xanax on a when necessary basis patient states it does not cause drowsiness Reflux under good control with omeprazole

## 2015-05-01 ENCOUNTER — Other Ambulatory Visit: Payer: Self-pay | Admitting: Gastroenterology

## 2015-05-03 ENCOUNTER — Ambulatory Visit (INDEPENDENT_AMBULATORY_CARE_PROVIDER_SITE_OTHER): Payer: Medicare Other | Admitting: Family Medicine

## 2015-05-03 ENCOUNTER — Encounter: Payer: Self-pay | Admitting: Family Medicine

## 2015-05-03 VITALS — BP 116/72 | Ht 68.5 in | Wt 157.4 lb

## 2015-05-03 DIAGNOSIS — R5383 Other fatigue: Secondary | ICD-10-CM | POA: Diagnosis not present

## 2015-05-03 DIAGNOSIS — I951 Orthostatic hypotension: Secondary | ICD-10-CM | POA: Diagnosis not present

## 2015-05-03 DIAGNOSIS — I639 Cerebral infarction, unspecified: Secondary | ICD-10-CM

## 2015-05-03 MED ORDER — METOPROLOL SUCCINATE ER 25 MG PO TB24: ORAL_TABLET | ORAL | Status: DC

## 2015-05-03 MED ORDER — BUPROPION HCL ER (SR) 150 MG PO TB12: 150.0000 mg | ORAL_TABLET | Freq: Every day | ORAL | Status: DC

## 2015-05-03 NOTE — Progress Notes (Signed)
   Subjective:    Patient ID: Randy [name], male    DOB: July 19, 1928, 81 y.o.   MRN: [id number]  HPI Patient arrives with c/o continuing dizzy spells and no energy. Patient has been seen a few weeks ago for it but not feeling better- feeling worse. Patient relates low energy not feeling good findings himself feeling rundown denies high fever chills cough wheeze denies shortness of breath. States it's more fatigue and dizziness.  Review of Systems    see above. Objective:   Physical Exam  Lungs are clear hearts regular blood pressure laying sitting standing slight drop 140/82 drops to 124/76 sitting and standing no tachycardia      Assessment & Plan:  Dizziness-this patient has had a thorough workup. His had MRI MRA of the head and neck. Significant lab work plus also orthostatics. I find no evidence of stroke or heart disease. Because of slight orthostasis reduce medication metoprolol one half every morning  Significant stress related issues as well putrid SR 150 mg 1 daily recheck patient in approximately 3 weeks he may continue Xanax intermittently  Intermittent muscle aches uses tramadol he may continue this

## 2015-05-03 NOTE — Addendum Note (Signed)
Addended by: Ofilia Neas R on: 05/03/2015 01:13 PM   Modules accepted: Orders, Medications

## 2015-05-05 ENCOUNTER — Other Ambulatory Visit: Payer: Self-pay | Admitting: *Deleted

## 2015-05-05 ENCOUNTER — Telehealth: Payer: Self-pay | Admitting: *Deleted

## 2015-05-05 MED ORDER — TRAMADOL HCL 50 MG PO TABS: 50.0000 mg | ORAL_TABLET | Freq: Four times a day (QID) | ORAL | Status: DC | PRN

## 2015-05-05 MED ORDER — TRAMADOL HCL 50 MG PO TABS: 50.0000 mg | ORAL_TABLET | Freq: Three times a day (TID) | ORAL | Status: DC | PRN

## 2015-05-05 NOTE — Telephone Encounter (Signed)
Discussed with pt. Med sent to pharm.  

## 2015-05-05 NOTE — Telephone Encounter (Signed)
Pt seen 11-2. Using tramadol for burning pain in arms, shoulders and legs. Pt states pharm told him he could use tramadol every 6 hours and pt did take four times yesterday and states pain is some better. Would like for rx to be increased to one qid and see if that helps through the weekend.  His dizziness is better. Sob is about the same. [employer]

## 2015-05-05 NOTE — Telephone Encounter (Signed)
May change his prescription to one 4 times a day when necessary, when doing better he should only take 3 if possible per day. Keep regular follow-ups follow-up sooner problems

## 2015-05-08 ENCOUNTER — Ambulatory Visit: Payer: Medicare Other | Admitting: Family Medicine

## 2015-05-08 ENCOUNTER — Telehealth: Payer: Self-pay | Admitting: Family Medicine

## 2015-05-08 DIAGNOSIS — M791 Myalgia: Secondary | ICD-10-CM | POA: Diagnosis not present

## 2015-05-08 DIAGNOSIS — IMO0001 Reserved for inherently not codable concepts without codable children: Secondary | ICD-10-CM

## 2015-05-08 DIAGNOSIS — M609 Myositis, unspecified: Secondary | ICD-10-CM | POA: Diagnosis not present

## 2015-05-08 NOTE — Telephone Encounter (Signed)
traMADol (ULTRAM) 50 MG tablet  pts daughter calling for him this morning stating he is still in a great deal of pain An the meds don't seem to be helping him at all. Can he get something else  Stronger or something else added to help him? Or does he need to come back in?  [employer]

## 2015-05-08 NOTE — Telephone Encounter (Signed)
Blood work ordered in [facility] -Patient advised he may add tylenol 500 mg up to 3 times a day per Dr Sallee Lange. Patient verbalized understanding and scheduled follow up office visit.

## 2015-05-08 NOTE — Telephone Encounter (Signed)
I would recommend sedimentation rate, rheumatoid factor, ANA antibody. May add Tylenol to the medication. Get lab work done follow-up within 1 week's time. Remember typically these labs take 2-3 days to get back so it is best he do the lab work then follow-up several days later for office visit

## 2015-05-08 NOTE — Telephone Encounter (Signed)
This pain is somewhat confusing. When he comes in he doesn't really pinpoint the pain. Please find out where his pain is- some more specifics please

## 2015-05-08 NOTE — Telephone Encounter (Signed)
Patient reports continued general body aches in arms and legs- takes tramadol- it used to completely get rid of pain- now only lessens it but does not get rid of it- pharmacist said he could increase the dose and add tylenol if you thought that was ok or change to hydrocodone. Also patient stopped wellbutrin after 3 days due to nausea and diarrhea.

## 2015-05-09 LAB — SEDIMENTATION RATE: Sed Rate: 3 mm/hr (ref 0–30)

## 2015-05-09 LAB — CK: Total CK: 49 U/L (ref 24–204)

## 2015-05-09 LAB — RHEUMATOID FACTOR: Rhuematoid fact SerPl-aCnc: <10 IU/mL (ref 0.0–13.9)

## 2015-05-09 LAB — ANA: ANA: NEGATIVE

## 2015-05-11 ENCOUNTER — Encounter: Payer: Self-pay | Admitting: Family Medicine

## 2015-05-11 ENCOUNTER — Ambulatory Visit (INDEPENDENT_AMBULATORY_CARE_PROVIDER_SITE_OTHER): Payer: Medicare Other | Admitting: Family Medicine

## 2015-05-11 VITALS — BP 132/86 | Ht 68.5 in | Wt 156.0 lb

## 2015-05-11 DIAGNOSIS — I639 Cerebral infarction, unspecified: Secondary | ICD-10-CM | POA: Diagnosis not present

## 2015-05-11 DIAGNOSIS — I951 Orthostatic hypotension: Secondary | ICD-10-CM | POA: Diagnosis not present

## 2015-05-11 DIAGNOSIS — R42 Dizziness and giddiness: Secondary | ICD-10-CM | POA: Diagnosis not present

## 2015-05-11 DIAGNOSIS — N400 Enlarged prostate without lower urinary tract symptoms: Secondary | ICD-10-CM | POA: Diagnosis not present

## 2015-05-11 DIAGNOSIS — F411 Generalized anxiety disorder: Secondary | ICD-10-CM | POA: Diagnosis not present

## 2015-05-11 NOTE — Progress Notes (Signed)
   Subjective:    Patient ID: Randy [name], male    DOB: 31-Oct-1928, 81 y.o.   MRN: [id number]  HPIpt arrives today with daughter Manuela Schwartz.    Follow up on pain. Having aches and pain all over. Taking tramadol every 4 -5 hours and takes tylenol one tid with tramadol.  Patient describes as muscle aches and discomfort in his upper back he also describes in his lower legs he states findings a lot of soreness and discomfort recent lab work looked good.  Still having dizziness and shortness of breath. Pt states about the same as last visit.  This patient had this ongoing. We have checked his orthostatics before and which was positive for orthostasis. Patient worried about underlying potential cause other than that. He has had a CAT scan as well as a arteriogram which did not show any blockages in the vertebral circulation and no sign of strokes.  Only took wellbutrin for 3 days. Stopped because of nausea and diarrhea and some other side effects.   I do believe this patient is having some stress and anxiety related to the death of his wife a while back. appetitie not good.   Fatigue.   Review of Systems  negative for any fevers vomiting diarrhea bleeding negative for chest tightness pressure pain or shortness of breath.    Objective:   Physical Exam   lungs are clear hearts regular abdomen soft pulse normal place since blood pressure was checked laying sitting standing. Blood pressure went from approximately 138/70 laying to all the way to 1 20/64 standing.     Assessment & Plan:   dizziness-orthostatic hypotension. I believe this is due to him taking 2 Flomax each night. It would be best for this patient to be overly get away from this. I believe the only way for him to get away from this would be surgical procedure on the prostate. Patient is hesitant to get this. He will discuss more with his urologist. We will send a copy of this dictation to them. Chronic pain tramadol seems to help I do not  want to go to hydrocodone because I think that could cause nausea and dizziness. Also would increase his risk of falling. He may use tramadol up to 4 times per day with a Tylenol.    patient was counseled to follow-up again with [place] in a proximally 6 weeks. 25 minutes was spent with the patient. Greater than half the time was spent in discussion and answering questions and counseling regarding the issues that the patient came in for today.  Proximally 30 minutes spent with patient discussing muscle pains as well as dizziness. He it was offered to him to be referred to rheumatology to make sure nothing else is going on likelihood of other diagnosis is low family defers on referral  I do believe there is some anxiety component Xanax recommended half a tablet in the morning half in the afternoon 1 in the evening I would recommend for this patient not to go above that.

## 2015-05-23 ENCOUNTER — Ambulatory Visit: Payer: Medicare Other | Admitting: Family Medicine

## 2015-06-09 ENCOUNTER — Ambulatory Visit: Payer: Medicare Other | Admitting: Family Medicine

## 2015-06-19 ENCOUNTER — Other Ambulatory Visit: Payer: Self-pay | Admitting: Family Medicine

## 2015-06-19 NOTE — Telephone Encounter (Signed)
May have this and 5 refills

## 2015-06-20 ENCOUNTER — Ambulatory Visit (INDEPENDENT_AMBULATORY_CARE_PROVIDER_SITE_OTHER): Payer: Medicare Other | Admitting: Urology

## 2015-06-20 DIAGNOSIS — N401 Enlarged prostate with lower urinary tract symptoms: Secondary | ICD-10-CM | POA: Diagnosis not present

## 2015-06-20 DIAGNOSIS — N320 Bladder-neck obstruction: Secondary | ICD-10-CM

## 2015-06-20 DIAGNOSIS — D090 Carcinoma in situ of bladder: Secondary | ICD-10-CM

## 2015-06-29 ENCOUNTER — Encounter: Payer: Self-pay | Admitting: *Deleted

## 2015-06-29 ENCOUNTER — Encounter: Payer: Self-pay | Admitting: Family Medicine

## 2015-06-29 ENCOUNTER — Ambulatory Visit (INDEPENDENT_AMBULATORY_CARE_PROVIDER_SITE_OTHER): Payer: Medicare Other | Admitting: Family Medicine

## 2015-06-29 VITALS — Ht 68.5 in | Wt 156.0 lb

## 2015-06-29 DIAGNOSIS — F411 Generalized anxiety disorder: Secondary | ICD-10-CM

## 2015-06-29 DIAGNOSIS — I639 Cerebral infarction, unspecified: Secondary | ICD-10-CM

## 2015-06-29 DIAGNOSIS — N400 Enlarged prostate without lower urinary tract symptoms: Secondary | ICD-10-CM | POA: Diagnosis not present

## 2015-06-29 MED ORDER — ALPRAZOLAM 0.5 MG PO TABS: ORAL_TABLET | ORAL | Status: DC

## 2015-06-29 MED ORDER — TAMSULOSIN HCL 0.4 MG PO CAPS: 0.8000 mg | ORAL_CAPSULE | Freq: Every day | ORAL | Status: DC

## 2015-06-29 NOTE — Progress Notes (Signed)
   Subjective:    Patient ID: Randy [name], male    DOB: 08/22/1928, 81 y.o.   MRN: [id number]  HPI  Patient arrives to discuss recent visit with urologist. Patient stated he got a good report and was left on flomax 2 tabs a day. Patient also needs refill on prilosec.  This patient has chronic anxiety related issues he uses a small amount of Xanax multiple times per day this seems to keep his symptoms under good control He also has chronic aches and pains in his joints tramadol helps with this. He also relates that he is using Pepcid OTC currently for his acid reflux and it is doing well Review of Systems  Constitutional: Negative for fever and fatigue.  HENT: Negative for congestion.   Respiratory: Negative for cough and choking.   Cardiovascular: Negative for chest pain.  Gastrointestinal: Negative for abdominal pain.  Genitourinary: Positive for difficulty urinating. Negative for dysuria.       Objective:   Physical Exam  Constitutional: He appears well-nourished. No distress.  Cardiovascular: Normal rate, regular rhythm and normal heart sounds.   No murmur heard. Pulmonary/Chest: Effort normal and breath sounds normal. No respiratory distress.  Musculoskeletal: He exhibits no edema.  Lymphadenopathy:    He has no cervical adenopathy.  Neurological: He is alert.  Psychiatric: His behavior is normal.  Vitals reviewed.    Blood pressure checked sitting and standing in good control     Assessment & Plan:  Intermittent reflux-Pepcid OTC when necessary Intermittent arthralgias tramadol when necessary Intermittent anxiety related issues does well on daily Xanax. BPH Flomax continue 1520 minutes spent with patient follow-up 3-4 months

## 2015-09-04 DIAGNOSIS — H5212 Myopia, left eye: Secondary | ICD-10-CM | POA: Diagnosis not present

## 2015-09-04 DIAGNOSIS — H5201 Hypermetropia, right eye: Secondary | ICD-10-CM | POA: Diagnosis not present

## 2015-09-04 DIAGNOSIS — H52223 Regular astigmatism, bilateral: Secondary | ICD-10-CM | POA: Diagnosis not present

## 2015-09-04 DIAGNOSIS — H33002 Unspecified retinal detachment with retinal break, left eye: Secondary | ICD-10-CM | POA: Diagnosis not present

## 2015-09-27 ENCOUNTER — Ambulatory Visit (INDEPENDENT_AMBULATORY_CARE_PROVIDER_SITE_OTHER): Payer: Medicare Other | Admitting: Family Medicine

## 2015-09-27 ENCOUNTER — Encounter: Payer: Self-pay | Admitting: Family Medicine

## 2015-09-27 VITALS — BP 132/76 | Temp 98.4°F | Ht 68.5 in | Wt 159.0 lb

## 2015-09-27 DIAGNOSIS — R739 Hyperglycemia, unspecified: Secondary | ICD-10-CM

## 2015-09-27 DIAGNOSIS — F411 Generalized anxiety disorder: Secondary | ICD-10-CM

## 2015-09-27 DIAGNOSIS — J019 Acute sinusitis, unspecified: Secondary | ICD-10-CM

## 2015-09-27 LAB — POCT GLYCOSYLATED HEMOGLOBIN (HGB A1C): Hemoglobin A1C: 5.7

## 2015-09-27 MED ORDER — TRAMADOL HCL 50 MG PO TABS: ORAL_TABLET | ORAL | Status: DC

## 2015-09-27 MED ORDER — AZITHROMYCIN 250 MG PO TABS: ORAL_TABLET | ORAL | Status: DC

## 2015-09-27 MED ORDER — ALPRAZOLAM 0.5 MG PO TABS: ORAL_TABLET | ORAL | Status: DC

## 2015-09-27 MED ORDER — METOPROLOL SUCCINATE ER 25 MG PO TB24: ORAL_TABLET | ORAL | Status: DC

## 2015-09-27 NOTE — Progress Notes (Signed)
   Subjective:    Patient ID: Ehitan Griego, male    DOB: [birth year], 82 y.o.   MRN: [id number]  Hypertension This is a chronic problem. The current episode started more than 1 year ago. Treatments tried: metoprolol. Compliance problems include exercise.    Congestion. Using mucinex. No relief. Off and on for a few weeks. Coughing up some brown mucus. Shortness of breath.  Body aches and weakness. Ongoing.    Predibetes. Results for orders placed or performed in visit on 09/27/15  POCT glycosylated hemoglobin (Hb A1C)  Result Value Ref Range   Hemoglobin A1C 5.7    Relates being anxious at times states Xanax helps sleeps okay breathing okay denies chest pain relates some joint discomforts.  Review of Systems Relates a lot of head congestion drainage coughing    Objective:   Physical Exam Head congestion noted eardrums normal throat normal lungs clear heart regular       Assessment & Plan:  Hyperglycemia A1c looks good Generalized anxiety disorder uses Xanax half a tablet 4 times per day denies causing drowsiness Patient with acute rhinosinusitis antibiotic prescribed Stomach issue stable

## 2015-10-30 ENCOUNTER — Encounter: Payer: Medicare Other | Admitting: Family Medicine

## 2015-11-29 ENCOUNTER — Ambulatory Visit (INDEPENDENT_AMBULATORY_CARE_PROVIDER_SITE_OTHER): Payer: Medicare Other | Admitting: Family Medicine

## 2015-11-29 ENCOUNTER — Ambulatory Visit (HOSPITAL_COMMUNITY)
Admission: RE | Admit: 2015-11-29 | Discharge: 2015-11-29 | Disposition: A | Discharge status: Home / Self Care | Payer: Medicare Other | Source: Ambulatory Visit | Attending: Family Medicine | Admitting: Family Medicine

## 2015-11-29 ENCOUNTER — Encounter: Payer: Self-pay | Admitting: Family Medicine

## 2015-11-29 VITALS — BP 122/82 | Ht 68.5 in | Wt 159.2 lb

## 2015-11-29 DIAGNOSIS — J309 Allergic rhinitis, unspecified: Secondary | ICD-10-CM

## 2015-11-29 DIAGNOSIS — I10 Essential (primary) hypertension: Secondary | ICD-10-CM

## 2015-11-29 DIAGNOSIS — Q791 Other congenital malformations of diaphragm: Secondary | ICD-10-CM | POA: Diagnosis not present

## 2015-11-29 DIAGNOSIS — F411 Generalized anxiety disorder: Secondary | ICD-10-CM | POA: Diagnosis not present

## 2015-11-29 DIAGNOSIS — R05 Cough: Secondary | ICD-10-CM

## 2015-11-29 DIAGNOSIS — R059 Cough, unspecified: Secondary | ICD-10-CM

## 2015-11-29 NOTE — Progress Notes (Signed)
   Subjective:    Patient ID: Randy [name], male    DOB: March 13, 1929, 82 y.o.   MRN: [id number]  Hypertension This is a chronic problem. The current episode started more than 1 year ago. The problem has been gradually improving since onset. There are no associated agents to hypertension. There are no known risk factors for coronary artery disease. Treatments tried: metoprolol. The current treatment provides moderate improvement. There are no compliance problems.    Patient states that he is having some shortness of breath. Phlegm noted also.   Patient states that he received a very nasty letter about missing an appointment. He states he was completely unaware of this appointment and he never misses appointments on purpose.  Patient relates frustration about this but we discussed in detail he is fine now  He denies any wheezing but does state at times low short of breath with coughing and some phlegm clear phlegm this been present for months no PND no orthopnea and no vomiting diarrhea fever chills no weight loss does have some mild underlying anxiety that Xanax helps  Review of Systems See above    Objective:   Physical Exam  Lungs clear heart regular pulse normal extremities no edema skin warm dry intermittent chest congestion noted bronchial sounding cough      Assessment & Plan:  HTN- Patient was seen today as part of a visit regarding hypertension. The importance of healthy diet and regular physical activity was discussed. The importance of compliance with medications discussed. Ideal goal is to keep blood pressure low elevated levels certainly below [id number] when possible. The patient was counseled that keeping blood pressure under control lessen his risk of heart attack, stroke, kidney failure, and early death. The importance of regular follow-ups was discussed with the patient. Low-salt diet such as DASH recommended. Regular physical activity was recommended as well. Patient was advised to  keep regular follow-ups.

## 2015-12-05 MED ORDER — CEFUROXIME AXETIL 500 MG PO TABS: 500.0000 mg | ORAL_TABLET | Freq: Two times a day (BID) | ORAL | Status: DC

## 2015-12-05 NOTE — Addendum Note (Signed)
Addended by: Dairl Ponder on: 12/05/2015 10:36 AM   Modules accepted: Orders

## 2015-12-19 ENCOUNTER — Other Ambulatory Visit (HOSPITAL_COMMUNITY)
Admission: RE | Admit: 2015-12-19 | Discharge: 2015-12-19 | Disposition: A | Discharge status: Home / Self Care | Payer: Medicare Other | Source: Ambulatory Visit | Attending: Urology | Admitting: Urology

## 2015-12-19 ENCOUNTER — Ambulatory Visit (INDEPENDENT_AMBULATORY_CARE_PROVIDER_SITE_OTHER): Payer: Medicare Other | Admitting: Urology

## 2015-12-19 DIAGNOSIS — C670 Malignant neoplasm of trigone of bladder: Secondary | ICD-10-CM

## 2015-12-19 DIAGNOSIS — R8299 Other abnormal findings in urine: Secondary | ICD-10-CM | POA: Diagnosis not present

## 2015-12-19 DIAGNOSIS — N400 Enlarged prostate without lower urinary tract symptoms: Secondary | ICD-10-CM | POA: Diagnosis not present

## 2015-12-19 DIAGNOSIS — C679 Malignant neoplasm of bladder, unspecified: Secondary | ICD-10-CM | POA: Insufficient documentation

## 2015-12-19 LAB — URINALYSIS, ROUTINE W REFLEX MICROSCOPIC
BILIRUBIN URINE: NEGATIVE
Glucose, UA: NEGATIVE mg/dL
Hgb urine dipstick: NEGATIVE
KETONES UR: NEGATIVE mg/dL
Leukocytes, UA: NEGATIVE
NITRITE: NEGATIVE
PH: 6 (ref 5.0–8.0)
Protein, ur: NEGATIVE mg/dL
Specific Gravity, Urine: 1.01 (ref 1.005–1.030)

## 2015-12-24 ENCOUNTER — Telehealth: Payer: Self-pay | Admitting: Family Medicine

## 2015-12-24 NOTE — Telephone Encounter (Signed)
Please tell the patient have recently I received a copy of urine cytology that the urologist did. It showed atypical cells. This was reported on June 20. Please make sure that the patient is following up with urology and please document that the patient is doing something

## 2015-12-25 NOTE — Telephone Encounter (Signed)
Spoke with patient and informed him per Dr.Scott Luking- The abnormal report that I got was from a test his urologist did. This was from a urine test that his urologist did. He needs to follow-up with urology with [facility]. He has seen Dr.Dalhdstet in the past. He should be able to set up an appointment with him if he needs our help we can set it up. Patient verbalized understanding and stated he will call Dr.Dalhdstet.

## 2015-12-25 NOTE — Telephone Encounter (Signed)
Spoke with patient and informed him that Dr.Scott Luking recently reviewed a copy of cytology report that the urologist did. It showed atypical cells. This was reported on June 20th. Patient verbalized understanding and stated that he was unaware that the report was abnormal and the labs were drawn at the hospital. Patient wanted to know what urologist did you want him to follow up with. States he did not know he had any abnormal testing. Please advise?

## 2015-12-25 NOTE — Telephone Encounter (Signed)
The abnormal report that I got was from a test his urologist did. This was from a urine test that his urologist dead. It was not from the bloodwork that we did. He needs to follow-up with urology with [facility]. He has seen Dr.Dalhdstet in the past. He should be able to set up an appointment with him if he needs our help please have [facility] help set him up

## 2016-01-04 ENCOUNTER — Ambulatory Visit (INDEPENDENT_AMBULATORY_CARE_PROVIDER_SITE_OTHER): Payer: Medicare Other | Admitting: Family Medicine

## 2016-01-04 ENCOUNTER — Encounter: Payer: Self-pay | Admitting: Family Medicine

## 2016-01-04 VITALS — BP 132/88 | Ht 68.5 in | Wt 157.4 lb

## 2016-01-04 DIAGNOSIS — R634 Abnormal weight loss: Secondary | ICD-10-CM

## 2016-01-04 DIAGNOSIS — R000 Tachycardia, unspecified: Secondary | ICD-10-CM

## 2016-01-04 DIAGNOSIS — R059 Cough, unspecified: Secondary | ICD-10-CM

## 2016-01-04 DIAGNOSIS — I10 Essential (primary) hypertension: Secondary | ICD-10-CM | POA: Diagnosis not present

## 2016-01-04 DIAGNOSIS — R05 Cough: Secondary | ICD-10-CM | POA: Diagnosis not present

## 2016-01-04 DIAGNOSIS — F411 Generalized anxiety disorder: Secondary | ICD-10-CM

## 2016-01-04 NOTE — Progress Notes (Addendum)
   Subjective:    Patient ID: Randy [name], male    DOB: August 21, 1928, 82 y.o.   MRN: [id number]  Hypertension This is a chronic problem. The current episode started more than 1 year ago. There are no compliance problems.     Patient in today to discuss recent cytology report.Patient states he saw the urologist who told him that everything seemed to be good and they recommended a follow-up in 6 months   Patient has c/o of cough with white thick sputum. He denies hemoptysis he denies fever chills. He does state that he is having some issues with feeling a little short winded when he moves around his appetite is fair he has lost 1 pound but his weights relatively stable over the past 6 months.  Patient denies being depressed. Denies any chest pressure. Does relate a little short windedness with activity.  Review of Systems Relates coughing congestion occasional wheezing    Objective:   Physical Exam  Patient slightly tachycardic occasional irregular beat lungs are clear on the left side but there is a fair amount of crackles and wheezes on the right side. Patient not respiratory distress but coughs multiple different times throughout today's visit  EKG was done shows an occasional PAC but no acute ST segment changes    Assessment & Plan:  Questionable cytology-we will try to get the reports from [facility] to try to figure out their point of view regarding his issue  Tachycardia under decent control with metoprolol continue this no sign of ischemic changes  Persistent cough along with some weight loss and also significant abnormality on previous x-ray-on his previous x-ray and may it was recommended by the radiologist to consider rolling forward with CAT scan of the chest if ongoing symptoms this patient has been through numerous testing and antibiotics without improvement and has persistent pulmonary findings as well as coughing and shortness of breath. Patient is a former smoker but  quit years ago. I believe it's important to go ahead and do a chest CT scan to rule out the possibility of a growth and also rule out the possibility of bronchiectasis  Follow-up will be based around CT report   Patient has persistent elevated right hemidiaphragm hard to tell if this is playing a role

## 2016-01-05 ENCOUNTER — Encounter: Payer: Self-pay | Admitting: Family Medicine

## 2016-01-12 ENCOUNTER — Ambulatory Visit (HOSPITAL_COMMUNITY)
Admission: RE | Admit: 2016-01-12 | Discharge: 2016-01-12 | Disposition: A | Discharge status: Home / Self Care | Payer: Medicare Other | Source: Ambulatory Visit | Attending: Family Medicine | Admitting: Family Medicine

## 2016-01-12 DIAGNOSIS — J984 Other disorders of lung: Secondary | ICD-10-CM | POA: Diagnosis not present

## 2016-01-12 DIAGNOSIS — R05 Cough: Secondary | ICD-10-CM | POA: Insufficient documentation

## 2016-01-12 DIAGNOSIS — R911 Solitary pulmonary nodule: Secondary | ICD-10-CM | POA: Diagnosis not present

## 2016-01-12 DIAGNOSIS — J9811 Atelectasis: Secondary | ICD-10-CM | POA: Diagnosis not present

## 2016-02-20 ENCOUNTER — Other Ambulatory Visit: Payer: Self-pay | Admitting: Family Medicine

## 2016-02-20 NOTE — Telephone Encounter (Signed)
May have this +3 additional refills 

## 2016-03-06 ENCOUNTER — Ambulatory Visit (INDEPENDENT_AMBULATORY_CARE_PROVIDER_SITE_OTHER): Payer: Medicare Other | Admitting: Family Medicine

## 2016-03-06 ENCOUNTER — Encounter: Payer: Self-pay | Admitting: Family Medicine

## 2016-03-06 VITALS — BP 128/82 | Ht 68.5 in | Wt 158.2 lb

## 2016-03-06 DIAGNOSIS — J431 Panlobular emphysema: Secondary | ICD-10-CM | POA: Insufficient documentation

## 2016-03-06 DIAGNOSIS — F411 Generalized anxiety disorder: Secondary | ICD-10-CM | POA: Diagnosis not present

## 2016-03-06 DIAGNOSIS — R05 Cough: Secondary | ICD-10-CM | POA: Insufficient documentation

## 2016-03-06 DIAGNOSIS — Z23 Encounter for immunization: Secondary | ICD-10-CM

## 2016-03-06 DIAGNOSIS — R0609 Other forms of dyspnea: Secondary | ICD-10-CM

## 2016-03-06 DIAGNOSIS — R053 Chronic cough: Secondary | ICD-10-CM | POA: Insufficient documentation

## 2016-03-06 NOTE — Progress Notes (Signed)
   Subjective:    Patient ID: Randy [name], male    DOB: Aug 16, 1928, 82 y.o.   MRN: [id number]  HPI Patient arrives to follow up on CT for pulmonary nodule.Long discussion held today regarding CT scan it does show some small amount of emphysema also shows a 3.8 mm nodule does not show any growth or mass nothing concerning for cancer. No pneumonia.  Patient with chronic anxiety state Xanax keeps it under decent control denies having major issues with this. If he does not take Xanax he has a lot of anxiety and sometimes panic feeling denies being depressed  Arthralgias on a consistent basis patient states tramadol helped some with the pain and discomfort he states he takes no more than 3 or 4 per day and does not abuse it and it does help   Review of Systems Relates occasional shortness of breath when he really pushes himself but otherwise he denies any significant issues. Does relate chronic cough. Does not bring up any hemoptysis. No fevers. No vomiting or diarrhea. No chest tightness or pain    Objective:   Physical Exam Chest congestion not present lungs are clear cough occasional is noted neck no masses heart regular pulse normal BP good       Assessment & Plan:  Chronic cough-related to emphysema. Patient not short of breath enough to be on any inhalers. Patient up-to-date on pneumonia vaccines. Flu vaccine this fall.  Chronic arthralgias patient tolerates tramadol takes it 3-4 times per day denies misusing it  Chronic anxiety issues patient uses Xanax no greater than 4 times a day states it does not cause drowsiness typically takes a half a pill 4 times a day  Pulmonary nodule very small 3.8 mm repeat CT scan next July low likelihood of cancer patient perfectly fine with following this with a CAT scan in one year

## 2016-03-07 NOTE — Progress Notes (Signed)
CT scan of the chest in July 2018 added to [facility] file

## 2016-03-25 ENCOUNTER — Ambulatory Visit (INDEPENDENT_AMBULATORY_CARE_PROVIDER_SITE_OTHER): Payer: Medicare Other | Admitting: Family Medicine

## 2016-03-25 ENCOUNTER — Encounter: Payer: Self-pay | Admitting: Family Medicine

## 2016-03-25 VITALS — BP 130/90 | Temp 97.9°F | Ht 68.5 in | Wt 158.4 lb

## 2016-03-25 DIAGNOSIS — R350 Frequency of micturition: Secondary | ICD-10-CM | POA: Diagnosis not present

## 2016-03-25 DIAGNOSIS — I10 Essential (primary) hypertension: Secondary | ICD-10-CM

## 2016-03-25 DIAGNOSIS — R600 Localized edema: Secondary | ICD-10-CM | POA: Diagnosis not present

## 2016-03-25 DIAGNOSIS — M79605 Pain in left leg: Secondary | ICD-10-CM | POA: Diagnosis not present

## 2016-03-25 LAB — POCT URINALYSIS DIPSTICK
PH UA: 6
Spec Grav, UA: <=1.005

## 2016-03-25 NOTE — Progress Notes (Signed)
   Subjective:    Patient ID: Randy [name], male    DOB: 19-Nov-1928, 82 y.o.   MRN: [id number]  Leg Pain   The incident occurred 5 to 7 days ago. The pain is present in the left leg and left ankle (groin area ).  This patient states he's had pain goes from the hip or an area down the side of his leg does not go past the knee is not in the thigh not in the calf no swelling in the thigh or the calf patient has had a little bit of swelling around his ankle although not severe Patient also has c/o vomiting, sore throat, painful urination, and abdominal pain. Patient denies high fever chills sweats denies rectal bleeding denies hematuria When questioned closer his urinary symptoms are more of a slight lower abdominal discomfort when he urinates  Review of Systems No fever no vomiting no diarrhea. No ongoing abdominal pain. Able to eat and drink    Objective:   Physical Exam Lungs are clear hearts regular pulse normal blood pressure good abdomen is soft no guarding or rebound negative straight leg raise subjective discomfort along the lateral portion of the left leg no calf tenderness no thigh tenderness has minimal edema in the lower ankle  Urinalysis negative     Assessment & Plan:  D-dimer to rule out the possibility of a clot I think the likelihood of this is low If progressive swelling or progressive pain immediately follow-up We will look at liver function as well as kidney function rule out that as a possibility causing the swelling  Once again certainly if patient is not improving over the next 1-2 weeks he is immediately to follow-up he understands this

## 2016-03-26 ENCOUNTER — Ambulatory Visit (HOSPITAL_COMMUNITY)
Admission: RE | Admit: 2016-03-26 | Discharge: 2016-03-26 | Disposition: A | Discharge status: Home / Self Care | Payer: Medicare Other | Source: Ambulatory Visit | Attending: Family Medicine | Admitting: Family Medicine

## 2016-03-26 DIAGNOSIS — M79605 Pain in left leg: Secondary | ICD-10-CM | POA: Diagnosis not present

## 2016-03-26 LAB — HEPATIC FUNCTION PANEL
ALK PHOS: 61 IU/L (ref 39–117)
ALT: 15 IU/L (ref 0–44)
AST: 14 IU/L (ref 0–40)
Albumin: 4.4 g/dL (ref 3.5–4.7)
BILIRUBIN TOTAL: 0.5 mg/dL (ref 0.0–1.2)
BILIRUBIN, DIRECT: 0.13 mg/dL (ref 0.00–0.40)
Total Protein: 7.4 g/dL (ref 6.0–8.5)

## 2016-03-26 LAB — BASIC METABOLIC PANEL
BUN/Creatinine Ratio: 14 (ref 10–24)
BUN: 17 mg/dL (ref 8–27)
CALCIUM: 9.7 mg/dL (ref 8.6–10.2)
CO2: 29 mmol/L (ref 18–29)
CREATININE: 1.23 mg/dL (ref 0.76–1.27)
Chloride: 93 mmol/L — ABNORMAL LOW (ref 96–106)
GFR, EST AFRICAN AMERICAN: 61 mL/min/{1.73_m2} (ref >59)
GFR, EST NON AFRICAN AMERICAN: 53 mL/min/{1.73_m2} — AB (ref >59)
Glucose: 105 mg/dL — ABNORMAL HIGH (ref 65–99)
Potassium: 5.1 mmol/L (ref 3.5–5.2)
Sodium: 139 mmol/L (ref 134–144)

## 2016-03-26 LAB — D-DIMER, QUANTITATIVE: D-DIMER: 0.71 mg/L FEU — ABNORMAL HIGH (ref 0.00–0.49)

## 2016-03-26 NOTE — Addendum Note (Signed)
Addended by: Dairl Ponder on: 03/26/2016 10:02 AM   Modules accepted: Orders

## 2016-03-27 LAB — URINE CULTURE

## 2016-03-29 ENCOUNTER — Ambulatory Visit: Payer: Medicare Other | Admitting: Family Medicine

## 2016-04-02 ENCOUNTER — Other Ambulatory Visit: Payer: Self-pay | Admitting: *Deleted

## 2016-04-02 MED ORDER — TRAMADOL HCL 50 MG PO TABS: ORAL_TABLET | ORAL | 4 refills | Status: DC

## 2016-04-02 NOTE — Progress Notes (Signed)
May have 4 additional refills

## 2016-04-04 ENCOUNTER — Ambulatory Visit (INDEPENDENT_AMBULATORY_CARE_PROVIDER_SITE_OTHER): Payer: Medicare Other | Admitting: Family Medicine

## 2016-04-04 ENCOUNTER — Other Ambulatory Visit: Payer: Self-pay | Admitting: Family Medicine

## 2016-04-04 DIAGNOSIS — R1310 Dysphagia, unspecified: Secondary | ICD-10-CM

## 2016-04-04 DIAGNOSIS — R0600 Dyspnea, unspecified: Secondary | ICD-10-CM

## 2016-04-04 DIAGNOSIS — R1312 Dysphagia, oropharyngeal phase: Secondary | ICD-10-CM | POA: Diagnosis not present

## 2016-04-04 NOTE — Progress Notes (Signed)
   Subjective:    Patient ID: Randy [name], male    DOB: 1929-04-23, 82 y.o.   MRN: [id number]  HPI  Patient arrives for a follow up on leg pain and swelling.This patient relates that the leg is doing better. He thinks it might of been a pinched nerve that resolved itself he is getting around much better   Patient states the leg pain is better but he is still having a lot of problems swallowing he states at times when he swallows he feels like they're stopped in the back of his throat that has a hard time going down. He denies any particular injury. Denies true dysphagia. It seems to be more in the back of her throat where his vocal cords windpipe and esophagus all come together. At times makes him feel slightly choked. Occasionally feels like food is coming up where he once to regurgitate. He denies abdominal pain.  Patient also at times feels a little anxious and nervous but Xanax half a tablet 4 times a day helps  He also suffers with musculoskeletal pains in his upper back and mid back that seemingly get a little worse with activity but get better with taking tramadol  Patient denies being depressed stays at home 95% of the time does not drive is much  Review of Systems See above    Objective:   Physical Exam Neck no masses lungs are clear no crackles heart regular pulse normal abdomen is soft extremities no edema   Daughter present    Assessment & Plan:  Oral pharyngeal dysphagia-referral to speech therapist for further evaluation and the swallowing test follow-up patient with him 4 weeks  Anxiety related issues Xanax as directed follow-up if problems  Patient mainly driving only during the day which I think is wiser for him recheck in 4 weeks

## 2016-04-04 NOTE — Telephone Encounter (Signed)
rf this plus 5

## 2016-04-09 ENCOUNTER — Ambulatory Visit (HOSPITAL_COMMUNITY): Payer: Medicare Other | Attending: Family Medicine | Admitting: Speech Pathology

## 2016-04-09 DIAGNOSIS — R1312 Dysphagia, oropharyngeal phase: Secondary | ICD-10-CM | POA: Diagnosis not present

## 2016-04-09 NOTE — Therapy (Signed)
[facility] [place], [place], [address] Phone: [phone]   Fax:  [phone]  Speech Language Pathology Evaluation/Clinical Swallow Evaluation  Patient Details  Name: Randy [name] MRN: [id number] Date of Birth: Jul 26, 1928 No Data Recorded  Encounter Date: 04/09/2016      End of Session - 04/09/16 1844    Visit Number 1   Number of Visits 1   Authorization Type Medicare/Medicare   SLP Start Time 1440   SLP Stop Time  1530   SLP Time Calculation (min) 50 min   Activity Tolerance Patient tolerated treatment well      Past Medical History:  Diagnosis Date  . Allergy   . Anxiety    claustraphobia  . BNC (bladder neck contracture)   . Cancer [facility])    bladder cancer  . Cervical spondylosis   . Claustrophobia   . Complication of anesthesia    claustraphobia  . COPD (chronic obstructive pulmonary disease) ([place])   . DJD (degenerative joint disease)    cervical  . Dysuria OCCASIONAL  . GERD (gastroesophageal reflux disease)    takes prilosec intermittently  . Hiatal hernia   . History of BPH   . Hx of bladder cancer   . Hypertension   . Impaired fasting glucose   . Status post dilation of esophageal narrowing   . Urinary hesitancy     Past Surgical History:  Procedure Laterality Date  . CARDIOVASCULAR STRESS TEST  11-08-2009  DR ROTHBART   LOW RISK MYOVIEW/ NO ISCHEMIA/ LVEF 64%  . CATARACT EXTRACTION W/ INTRAOCULAR LENS  IMPLANT, BILATERAL    . CHOLECYSTECTOMY  1984   OPEN  . collapsed lung  1965  . CYSTOSCOPY WITH BIOPSY N/A 11/17/2012   Procedure: CYSTOSCOPY WITH BIOPSY;  Surgeon: Franchot Gallo, MD;  Location: [facility];  Service: Urology;  Laterality: N/A;  . LAPAROSCOPIC REPAIR LARGE HIATAL HERNIA W/ MESH AND NISSEN FUNDOPLICATION  [id number]  . PROSTATE BIOPSY N/A 11/17/2012   Procedure: BIOPSY TRANSRECTAL ULTRASONIC PROSTATE (TUBP);  Surgeon: Franchot Gallo, MD;  Location: [facility];  Service:  Urology;  Laterality: N/A;  45 MINS 1. TRANSRECTAL ULTRASOUND OF PROSTATE WITH BIOPSY -will need ultrasound tech 2.CYSTOSCOPY, BLADDER BIOPSY - need resectoscope- not [facility] [id number] Medicare -[id number] BCBS - [id number]  . REPOSITION OF LENS Right 03/31/2014   Procedure: REPOSITIONING OF INTRAOCULAR LENS IMPLANT RIGHT EYE;  Surgeon: Tonny Branch, MD;  Location: [facility];  Service: Ophthalmology;  Laterality: Right;  . RETINAL DETACHMENT SURGERY  1988  . TRANSURETHRAL RESECTION OF BLADDER NECK  05/01/2012   Procedure: TRANSURETHRAL RESECTION OF BLADDER NECK;  Surgeon: Franchot Gallo, MD;  [facility];  Service: Urology;  Laterality: N/A;  [place]   . TRANSURETHRAL RESECTION OF BLADDER TUMOR  01/06/2012   Procedure: TRANSURETHRAL RESECTION OF BLADDER TUMOR (TURBT);  Surgeon: Franchot Gallo, MD;  Location: [facility];  Service: Urology;  Laterality: N/A;  . TRANSURETHRAL RESECTION OF PROSTATE    . TRANSURETHRAL RESECTION OF PROSTATE      There were no vitals filed for this visit.   MBSS from acute care stay June 2016: Mr. Daxtyn Glowinski was seen for MBSS this date after BSE showed soft signs of aspiration. Pt assessed with barium tinged thin liquid, puree, regular textures, and barium tablet in the lateral position seated upright in the Ha[place] chair. Pt with mild pharyngeal phase dysphagia characterized by mild premature spillage with large cup  and straw sips thin with swallow trigger after spilling towards pyriforms resulting in variable flash penetration of thins without aspiration and min residuals noted in lateral channels and pyriforms with thin. Mild residuals with puree texture near CP segment. Pt appeared to have greater residuals in the right pyriform, but residuals cleared with head turn to left or right after the swallow. Pt did present with wet vocal quality, but no barium noted in laryngeal  vestibule. During esophageal sweep, SLP noted what appeared to be a hiatal hernia which barium tablet was retained in despite liquid wash. When pt was given puree and thin liquid in effort to clear barium tablet, tertiary contractions noted above hiatal hernia, but esophagus did eventually clear (except for the barium tablet). Pt was asked if he had ever been told that he had a hiatal hernia and responded "no", however another daughter present stated that he did in fact have a procedure to repair a large hiatal hernia "a few years ago" in [place]. SLP delved further in the chart to find that pt did have laparoscopic repair of large hiatal hernia with biologic mesh reinforcement and Nissen fundoplication 0000000 with Dr. Excell Seltzer in [place]. Recommend regular diet with thin liquids with implementation of standard aspiration and reflux precautions (reviewed with pt and family), swallow 2x for each sip/bite, and consider frequent smaller meals rather than large meals. Pt also to take small sips of liquids (straw ok with small sips). No further SLP services indicated at this time. Pt and his daughters were advised to monitor for any exacerbation of symptoms (increased coughing, significant early satiety, weight loss) and consider follow up with Dr. Excell Seltzer. Chest CT may also be able to show hiatal hernia. Once completed in 2013 did show small hiatal hernia. Nissen fundoplications can "slip", but unable to tell with modified barium swallow study and radiologist not available.  EGD with Dr. Deatra Ina 03/22/2016: ESOPHAGUS: There was a peptic stricture at the gastroesophageal junction. The stricture was traversable. The stricture was dilated using a 17.63mm (52Fr) Maloney dilator. There was moderate resistance. Following this dilation, there was a small amount of heme and a small mucosal rent. STOMACH: A 3 cm hiatal hernia was noted. The exam was otherwise normal Retroflexed views revealed  no abnormalities. The scope was then withdrawn from the patient and the procedure completed.  CT Chest 01/12/2016: IMPRESSION: 1. No acute findings.  No evidence of pneumonia or pulmonary edema. 2. Peripheral areas of parenchymal lung scarring. Mild basilar subsegmental atelectasis. 3. 3.8 mm right middle lobe nodule. No follow-up needed if patient is low-risk. Non-contrast chest CT can be considered in 12 months if patient is high-risk. This recommendation follows the consensus statement: Guidelines for Management of Incidental Pulmonary Nodules Detected on CT Images:From the Fleischner Society 2017; published online before print (10.1148/radiol.IJ:2314499).      Subjective Assessment - 04/09/16 1839    Subjective "Sometimes I just can't swallow."   Currently in Pain? No/denies          Prior Functional Status - 04/09/16 1841      Prior Functional Status   Cognitive/Linguistic Baseline Within functional limits   Type of Home House    Lives With Alone   Vocation Retired         General - 04/09/16 1841      General Information   Date of Onset 03/25/16   HPI Mr. Kevonte Arensdorf is an 82 yo male who was referred by Dr. Wolfgang Phoenix for a clinical swallow evaluation  Type of Study Bedside Swallow Evaluation   Previous Swallow Assessment MBSS June 2016, EGD with dilation 03/23/2015   Diet Prior to this Study Regular;Thin liquids   Temperature Spikes Noted No   Respiratory Status Room air   History of Recent Intubation No   Behavior/Cognition Alert;Cooperative;Pleasant mood   Oral Cavity Assessment Within Functional Limits   Oral Care Completed by SLP No   Oral Cavity - Dentition Dentures, top;Dentures, bottom   Vision Functional for self-feeding   Self-Feeding Abilities Able to feed self   Patient Positioning Upright in chair   Baseline Vocal Quality Hoarse   Volitional Cough Strong   Volitional Swallow Able to elicit          Oral Motor/Sensory Function - 06-May-2016 1842       Oral Motor/Sensory Function   Overall Oral Motor/Sensory Function Within functional limits         Ice Chips - May 06, 2016 1842      Ice Chips   Ice chips Not tested         Thin Liquid - 06-May-2016 1842      Thin Liquid   Thin Liquid Within functional limits   Presentation Cup;Straw;Self Fed   Other Comments mild wet vocal quality observed after sequential straw sips         Nectar thick liquid - May 06, 2016 1843      Nectar Thick Liquid   Nectar Thick Liquid Not tested         Honey Thick Liquid - 05-06-16 1843      Honey Thick Liquid   Honey Thick Liquid Not tested         Puree - May 06, 2016 1843      Puree   Puree Not tested         Solid - 05-06-2016 1843      Solid   Solid Within functional limits   Presentation Self Fed   Other Comments graham crackers           Plan - 05-06-2016 1844    Clinical Impression Statement Pt reports difficulty swallowing both solids and liquids intermittently. Pt was seen for MBSS in June 2016 (see above) and EGD with dilation in September 2016. Review of Dr. Kelby Fam notes from October 2016 reveal improvement in swallow s/p dilation. Pt complains of cough and dysphagia. He reports, "The bigger stuff goes down okay, but the little bits after I swallow bother me". Oral motor examination is WNL, no gross asymmetry or weakness noted. Pt with mild hoarse vocal quality. He says that he has been seen by ENT in the past, but only for hearing. Pt with reflux and managed with Prilosec. He also takes Tramadol for pain and Xanax for anxiety. Pt reports xerostomia. Pt without overt signs/symptoms of aspiration during clinical exam, however he does present with mild wet vocal quality and throat clearing after sequential straw sips. Given ongoing pt complaints about dysphagia, will complete MBSS to objectively evaluate swallow and identify strategies and diet as appropriate. Will request order from his PCP, Dr. Wolfgang Phoenix. Pt in agreement with  plan of care.    Treatment/Interventions --  MBSS   SLP Home Exercise Plan MBSS   Consulted and Agree with Plan of Care Patient      Patient will benefit from skilled therapeutic intervention in order to improve the following deficits and impairments:   Dysphagia, oropharyngeal phase      G-Codes - 2016-05-06 1845    Functional Assessment Tool Used clinical judgment  Functional Limitations Swallowing   Swallow Current Status [phone]) At least 1 percent but less than 20 percent impaired, limited or restricted   Swallow Goal Status [id number]) At least 1 percent but less than 20 percent impaired, limited or restricted   Swallow Discharge Status [phone]) At least 1 percent but less than 20 percent impaired, limited or restricted      Problem List Patient Active Problem List   Diagnosis Date Noted  . Generalized anxiety disorder 03/06/2016  . Chronic cough 03/06/2016  . Panlobular emphysema ([place]) 03/06/2016  . DOE (dyspnea on exertion) 03/06/2016  . Orthostatic hypotension 05/11/2015  . Dizziness and giddiness 03/28/2015  . Palpitations 02/10/2015  . Near syncope   . Dysphagia 12/28/2014  . Insomnia 12/28/2014  . Claustrophobia 12/28/2014  . Senile purpura ([place]) 12/09/2014  . Prediabetes 08/17/2014  . Premature atrial contractions 04/08/2014  . Essential hypertension, benign 02/09/2013  . DYSPNEA ON EXERTION 11/02/2009  . CHEST PAIN 11/02/2009  . GERD 03/07/2009  . Osteoarthritis 03/07/2009  . CHEST DISCOMFORT 03/07/2009  . BPH (benign prostatic hyperplasia) 03/07/2009   Thank you,  Genene Churn, [employer]  [facility] 04/09/2016, 6:45 PM  [facility] [address] [place], [place], [address] Phone: [phone]   Fax:  [phone]  Name: Kenon Paik MRN: [id number] Date of Birth: [birth year]

## 2016-05-01 ENCOUNTER — Ambulatory Visit (INDEPENDENT_AMBULATORY_CARE_PROVIDER_SITE_OTHER): Payer: Medicare Other | Admitting: Family Medicine

## 2016-05-01 ENCOUNTER — Encounter: Payer: Self-pay | Admitting: Family Medicine

## 2016-05-01 VITALS — BP 122/84 | Ht 68.5 in | Wt 158.8 lb

## 2016-05-01 DIAGNOSIS — R1312 Dysphagia, oropharyngeal phase: Secondary | ICD-10-CM

## 2016-05-01 DIAGNOSIS — R0600 Dyspnea, unspecified: Secondary | ICD-10-CM | POA: Diagnosis not present

## 2016-05-01 NOTE — Progress Notes (Signed)
   Subjective:    Patient ID: Randy [name], male    DOB: 1928-11-06, 82 y.o.   MRN: [id number]  HPI Patient arrives for a follow up on dysphagia. Patient reports he is doing about the same. This patient relates that sometimes food gets stuck in his throat area he did see speech therapist who recommended modified barium swallow patient relates his other conditions are doing well as expected. His medications help him with muscle discomfort and anxiety Review of Systems Denies any chest tightness pressure pain shortness breath    Objective:   Physical Exam  Lungs clear hearts regular HEENT benign      Assessment & Plan:  Anxiousness stable with Xanax Intermittent muscle discomforts and joint discomforts doing well with tramadol Swallowing difficulties-set up modified barium swallow Occasional dyspnea chest x-ray ordered patient does not want pulmonary function currently

## 2016-05-02 ENCOUNTER — Other Ambulatory Visit: Payer: Self-pay | Admitting: Family Medicine

## 2016-05-02 ENCOUNTER — Ambulatory Visit (HOSPITAL_COMMUNITY)
Admission: RE | Admit: 2016-05-02 | Discharge: 2016-05-02 | Disposition: A | Discharge status: Home / Self Care | Payer: Medicare Other | Source: Ambulatory Visit | Attending: Family Medicine | Admitting: Family Medicine

## 2016-05-02 DIAGNOSIS — R1310 Dysphagia, unspecified: Secondary | ICD-10-CM

## 2016-05-02 DIAGNOSIS — R05 Cough: Secondary | ICD-10-CM | POA: Diagnosis not present

## 2016-05-02 DIAGNOSIS — R918 Other nonspecific abnormal finding of lung field: Secondary | ICD-10-CM | POA: Diagnosis not present

## 2016-05-07 DIAGNOSIS — R3914 Feeling of incomplete bladder emptying: Secondary | ICD-10-CM | POA: Diagnosis not present

## 2016-05-20 ENCOUNTER — Other Ambulatory Visit: Payer: Self-pay | Admitting: Family Medicine

## 2016-05-20 ENCOUNTER — Ambulatory Visit (HOSPITAL_COMMUNITY): Payer: Medicare Other | Attending: Family Medicine | Admitting: Speech Pathology

## 2016-05-20 ENCOUNTER — Ambulatory Visit (HOSPITAL_COMMUNITY)
Admission: RE | Admit: 2016-05-20 | Discharge: 2016-05-20 | Disposition: A | Discharge status: Home / Self Care | Payer: Medicare Other | Source: Ambulatory Visit | Attending: Family Medicine | Admitting: Family Medicine

## 2016-05-20 ENCOUNTER — Encounter (HOSPITAL_COMMUNITY): Payer: Self-pay | Admitting: Speech Pathology

## 2016-05-20 DIAGNOSIS — R1310 Dysphagia, unspecified: Secondary | ICD-10-CM | POA: Insufficient documentation

## 2016-05-20 DIAGNOSIS — R1312 Dysphagia, oropharyngeal phase: Secondary | ICD-10-CM | POA: Diagnosis not present

## 2016-05-20 NOTE — Therapy (Signed)
[place], [place], [address] Phone: [phone]   Fax:  [phone]  Modified Barium Swallow  Patient Details  Name: Randy [name] MRN: [id number] Date of Birth: Nov 06, 1928 No Data Recorded  Encounter Date: 05/20/2016      End of Session - 05/20/16 2146    Visit Number 1   Number of Visits 1   Authorization Type Medicare/Medicare   SLP Start Time [id number]   SLP Stop Time  1338   SLP Time Calculation (min) 26 min   Activity Tolerance Patient tolerated treatment well      Past Medical History:  Diagnosis Date  . Allergy   . Anxiety    claustraphobia  . BNC (bladder neck contracture)   . Cancer [facility])    bladder cancer  . Cervical spondylosis   . Claustrophobia   . Complication of anesthesia    claustraphobia  . COPD (chronic obstructive pulmonary disease) ([place])   . DJD (degenerative joint disease)    cervical  . Dysuria OCCASIONAL  . GERD (gastroesophageal reflux disease)    takes prilosec intermittently  . Hiatal hernia   . History of BPH   . Hx of bladder cancer   . Hypertension   . Impaired fasting glucose   . Status post dilation of esophageal narrowing   . Urinary hesitancy     Past Surgical History:  Procedure Laterality Date  . CARDIOVASCULAR STRESS TEST  11-08-2009  DR ROTHBART   LOW RISK MYOVIEW/ NO ISCHEMIA/ LVEF 64%  . CATARACT EXTRACTION W/ INTRAOCULAR LENS  IMPLANT, BILATERAL    . CHOLECYSTECTOMY  1984   OPEN  . collapsed lung  1965  . CYSTOSCOPY WITH BIOPSY N/A 11/17/2012   Procedure: CYSTOSCOPY WITH BIOPSY;  Surgeon: Franchot Gallo, MD;  Location: [facility];  Service: Urology;  Laterality: N/A;  . LAPAROSCOPIC REPAIR LARGE HIATAL HERNIA W/ MESH AND NISSEN FUNDOPLICATION  [id number]  . PROSTATE BIOPSY N/A 11/17/2012   Procedure: BIOPSY TRANSRECTAL ULTRASONIC PROSTATE (TUBP);  Surgeon: Franchot Gallo, MD;  Location: [facility];  Service: Urology;  Laterality: N/A;  45 MINS 1.  TRANSRECTAL ULTRASOUND OF PROSTATE WITH BIOPSY -will need ultrasound tech 2.CYSTOSCOPY, BLADDER BIOPSY - need resectoscope- not [place] [id number] Medicare -[id number] BCBS - [id number]  . REPOSITION OF LENS Right 03/31/2014   Procedure: REPOSITIONING OF INTRAOCULAR LENS IMPLANT RIGHT EYE;  Surgeon: Tonny Branch, MD;  Location: [facility];  Service: Ophthalmology;  Laterality: Right;  . RETINAL DETACHMENT SURGERY  1988  . TRANSURETHRAL RESECTION OF BLADDER NECK  05/01/2012   Procedure: TRANSURETHRAL RESECTION OF BLADDER NECK;  Surgeon: Franchot Gallo, MD;  Location: [facility];  Service: Urology;  Laterality: N/A;  [place]   . TRANSURETHRAL RESECTION OF BLADDER TUMOR  01/06/2012   Procedure: TRANSURETHRAL RESECTION OF BLADDER TUMOR (TURBT);  Surgeon: Franchot Gallo, MD;  Location: [facility];  Service: Urology;  Laterality: N/A;  . TRANSURETHRAL RESECTION OF PROSTATE    . TRANSURETHRAL RESECTION OF PROSTATE      There were no vitals filed for this visit.      Subjective Assessment - 05/20/16 2138    Subjective "I do ok with larger bites."   Special Tests MBSS   Currently in Pain? No/denies     Clinical impression from clinical swallow evaluation 04/10/2016: Pt reports difficulty swallowing both solids and liquids intermittently. Pt was seen for MBSS in June 2016 (see above) and EGD with dilation  in September 2016. Review of Dr. Kelby Fam notes from October 2016 reveal improvement in swallow s/p dilation. Pt complains of cough and dysphagia. He reports, "The bigger stuff goes down okay, but the little bits after I swallow bother me". Oral motor examination is WNL, no gross asymmetry or weakness noted. Pt with mild hoarse vocal quality. He says that he has been seen by ENT in the past, but only for hearing. Pt with reflux and managed with Prilosec. He also takes Tramadol for pain and Xanax for anxiety. Pt reports  xerostomia. Pt without overt signs/symptoms of aspiration during clinical exam, however he does present with mild wet vocal quality and throat clearing after sequential straw sips. Given ongoing pt complaints about dysphagia, will complete MBSS to objectively evaluate swallow and identify strategies and diet as appropriate. Will request order from his PCP, Dr. Wolfgang Phoenix. Pt in agreement with plan of care.        General - 05/20/16 2139      General Information   Date of Onset 03/25/16   HPI [name] is an 82 yo male who was referred by Dr. Wolfgang Phoenix for a swallowing evaluation. Pt without overt signs/symptoms of aspiration during clinical exam, however he does present with mild wet vocal quality and throat clearing after sequential straw sips. Given ongoing pt complaints about dysphagia, will complete MBSS to objectively evaluate swallow and identify strategies and diet as appropriate.   Type of Study MBS-Modified Barium Swallow Study   Previous Swallow Assessment MBSS June 2016, EGD with dilation 03/23/2015   Diet Prior to this Study Regular;Thin liquids   Temperature Spikes Noted No   Respiratory Status Room air   History of Recent Intubation No   Behavior/Cognition Alert;Cooperative;Pleasant mood   Oral Cavity Assessment Within Functional Limits;Dry   Oral Care Completed by SLP No   Oral Cavity - Dentition Dentures, top;Dentures, bottom   Vision Functional for self feeding   Self-Feeding Abilities Able to feed self   Patient Positioning Upright in chair   Baseline Vocal Quality Normal;Hoarse   Volitional Cough Strong   Volitional Swallow Able to elicit   Anatomy Within functional limits   Pharyngeal Secretions Not observed secondary MBS            Oral Preparation/Oral Phase - 05/20/16 2141      Oral Preparation/Oral Phase   Oral Phase Impaired     Oral - Solids   Oral - Regular Weak ligual manipulation;Delayed A-P transit;Piecemeal swallowing     Electrical  stimulation - Oral Phase   Was Electrical Stimulation Used No          Pharyngeal Phase - 05/20/16 2142      Pharyngeal Phase   Pharyngeal Phase Impaired     Pharyngeal - Thin   Pharyngeal- Thin Cup Swallow initiation at vallecula;Swallow initiation at pyriform sinus;Pharyngeal residue - pyriform     Pharyngeal Phase - Comment   Pharyngeal Comment Right>Left pyriform sinus residue with liquids, which clears with spontaneous 2nd swallow     Electrical Stimulation - Pharyngeal Phase   Was Electrical Stimulation Used No          Cricopharyngeal Phase - 05/20/16 2145      Cervical Esophageal Phase   Cervical Esophageal Phase Within functional limits           Plan - 05/20/16 2147    Clinical Impression Statement  Pt seen for MBSS in lateral, upright position and assessed with thin liquids, puree, barium tablet with thin,  and regular solids. Pt presents with mild oral phase dysphagia characterized by delayed oral transit with solids and piecemeal deglutition; min pharyngeal phase dysphagia characterized by premature spillage with liquids to the pyriforms. No penetration or aspiration was observed throughout the test, however consistent residuals post swallow noted after the primary swallows in the right pyriforms with thin liquids, which cleared with spontaneous secondary swallow. Pt reported feeling of stasis after bites of puree and pointed to pharynx, however no residuals noted and esophageal sweep was clear. Pt did present with hoarse and slightly wet vocal quality before, during, and after po, however no penetration/aspiration observed. If pt/MD desire further assessment due to his complaints, may wish to pursue ENT consult due to hoarse, wet vocal quality. Pt states that he does not wish to pursue this at this time. Pt encouraged to swallow 2x for each sip of liquid and continue with reflux precautions. No further SLP services indicated at this time.    Treatment/Interventions  --  MBSS   Consulted and Agree with Plan of Care Patient      Patient will benefit from skilled therapeutic intervention in order to improve the following deficits and impairments:   Dysphagia, oropharyngeal phase      G-Codes - 2016/06/06 2145/10/15    Functional Assessment Tool Used clinical judgment   Functional Limitations Swallowing   Swallow Current Status [id number]) At least 1 percent but less than 20 percent impaired, limited or restricted   Swallow Goal Status [id number]) At least 1 percent but less than 20 percent impaired, limited or restricted   Swallow Discharge Status [phone]) At least 1 percent but less than 20 percent impaired, limited or restricted          Recommendations/Treatment - 06/06/16 2143/10/16      Swallow Evaluation Recommendations   SLP Diet Recommendations Thin;Age appropriate regular   Liquid Administration via Cup   Medication Administration Whole meds with liquid   Supervision Patient able to self feed   Compensations Multiple dry swallows after each bite/sip   Postural Changes Seated upright at 90 degrees;Remain upright for at least 30 minutes after feeds/meals          Prognosis - 06-06-2016 2144/10/15      Prognosis   Prognosis for Safe Diet Advancement Good     Individuals Consulted   Consulted and Agree with Results and Recommendations Patient   Report Sent to  Referring physician      Problem List Patient Active Problem List   Diagnosis Date Noted  . Generalized anxiety disorder 03/06/2016  . Chronic cough 03/06/2016  . Panlobular emphysema ([place]) 03/06/2016  . DOE (dyspnea on exertion) 03/06/2016  . Orthostatic hypotension 05/11/2015  . Dizziness and giddiness 03/28/2015  . Palpitations 02/10/2015  . Near syncope   . Dysphagia 12/28/2014  . Insomnia 12/28/2014  . Claustrophobia 12/28/2014  . Senile purpura ([place]) 12/09/2014  . Prediabetes 08/17/2014  . Premature atrial contractions 04/08/2014  . Essential hypertension, benign 02/09/2013  .  DYSPNEA ON EXERTION 11/02/2009  . CHEST PAIN 11/02/2009  . GERD 03/07/2009  . Osteoarthritis 03/07/2009  . CHEST DISCOMFORT 03/07/2009  . BPH (benign prostatic hyperplasia) 03/07/2009   Thank you,  Genene Churn, [place]  [facility] 06/06/16, 9:49 PM  [place] [address] [place], [place], [address] Phone: [phone]   Fax:  [phone]  Name: Randy [name] MRN: [id number] Date of Birth: 1928/07/02

## 2016-06-05 ENCOUNTER — Ambulatory Visit: Payer: Medicare Other | Admitting: Family Medicine

## 2016-06-18 ENCOUNTER — Telehealth: Payer: Self-pay | Admitting: Family Medicine

## 2016-06-18 ENCOUNTER — Ambulatory Visit (INDEPENDENT_AMBULATORY_CARE_PROVIDER_SITE_OTHER): Payer: Medicare Other | Admitting: Urology

## 2016-06-18 ENCOUNTER — Other Ambulatory Visit (HOSPITAL_COMMUNITY)
Admission: RE | Admit: 2016-06-18 | Discharge: 2016-06-18 | Disposition: A | Discharge status: Home / Self Care | Payer: Medicare Other | Source: Ambulatory Visit | Attending: Urology | Admitting: Urology

## 2016-06-18 DIAGNOSIS — C679 Malignant neoplasm of bladder, unspecified: Secondary | ICD-10-CM | POA: Diagnosis not present

## 2016-06-18 DIAGNOSIS — C678 Malignant neoplasm of overlapping sites of bladder: Secondary | ICD-10-CM | POA: Diagnosis not present

## 2016-06-18 DIAGNOSIS — R8299 Other abnormal findings in urine: Secondary | ICD-10-CM | POA: Diagnosis not present

## 2016-06-18 DIAGNOSIS — R3914 Feeling of incomplete bladder emptying: Secondary | ICD-10-CM | POA: Diagnosis not present

## 2016-06-18 NOTE — Telephone Encounter (Signed)
I believe a follow-up in January within the first 2 weeks of January would be fine unless the patient has a pressing issue

## 2016-06-18 NOTE — Telephone Encounter (Signed)
Patient says Dr. Nicki Reaper told him to call us and schedule an appointment around this time of the year.  He wants to know if Dr. Nicki Reaper wants him to follow up before or after Christmas?

## 2016-06-19 NOTE — Telephone Encounter (Signed)
Notified patient, appointment made.

## 2016-07-08 ENCOUNTER — Encounter: Payer: Self-pay | Admitting: Family Medicine

## 2016-07-08 ENCOUNTER — Ambulatory Visit (INDEPENDENT_AMBULATORY_CARE_PROVIDER_SITE_OTHER): Payer: Medicare Other | Admitting: Family Medicine

## 2016-07-08 VITALS — BP 124/80 | Ht 68.5 in | Wt 161.5 lb

## 2016-07-08 DIAGNOSIS — F411 Generalized anxiety disorder: Secondary | ICD-10-CM | POA: Diagnosis not present

## 2016-07-08 DIAGNOSIS — J019 Acute sinusitis, unspecified: Secondary | ICD-10-CM | POA: Diagnosis not present

## 2016-07-08 DIAGNOSIS — I10 Essential (primary) hypertension: Secondary | ICD-10-CM

## 2016-07-08 MED ORDER — TRAMADOL HCL 50 MG PO TABS: ORAL_TABLET | ORAL | 5 refills | Status: DC

## 2016-07-08 MED ORDER — ALPRAZOLAM 0.5 MG PO TABS: ORAL_TABLET | ORAL | 5 refills | Status: DC

## 2016-07-08 MED ORDER — TAMSULOSIN HCL 0.4 MG PO CAPS: 0.8000 mg | ORAL_CAPSULE | Freq: Every day | ORAL | 12 refills | Status: DC

## 2016-07-08 MED ORDER — ALPRAZOLAM 0.5 MG PO TABS: ORAL_TABLET | ORAL | 3 refills | Status: DC

## 2016-07-08 MED ORDER — METOPROLOL SUCCINATE ER 25 MG PO TB24: ORAL_TABLET | ORAL | 6 refills | Status: DC

## 2016-07-08 MED ORDER — AMOXICILLIN 500 MG PO TABS
500.0000 mg | ORAL_TABLET | Freq: Three times a day (TID) | ORAL | 0 refills | Status: DC
Start: 2016-07-08 — End: 2016-10-07

## 2016-07-08 NOTE — Progress Notes (Signed)
   Subjective:    Patient ID: Randy [name], male    DOB: Sep 18, 1928, 83 y.o.   MRN: [id number]  Hypertension  This is a chronic problem. The current episode started more than 1 year ago. The problem has been gradually improving since onset. Pertinent negatives include no chest pain. There are no associated agents to hypertension. There are no known risk factors for coronary artery disease. Treatments tried: metoprolol. The current treatment provides moderate improvement. There are no compliance problems.    Patient needs a refill on his xanax.  Patient has some sinus congestion. Onset several days ago. Fatigue noted also.    Review of Systems  Constitutional: Negative for activity change and fever.  HENT: Positive for congestion and rhinorrhea. Negative for ear pain.   Eyes: Negative for discharge.  Respiratory: Positive for cough. Negative for wheezing.   Cardiovascular: Negative for chest pain.  Patient states Flomax helps him with urination. He does relate a lot of sinus symptoms head congestion sinus pressure over the past 45 days Low-dose metoprolol helping as well Low-dose Xanax helps him with nervousness      Objective:   Physical Exam  Constitutional: He appears well-developed.  HENT:  Head: Normocephalic.  Mouth/Throat: Oropharynx is clear and moist. No oropharyngeal exudate.  Neck: Normal range of motion.  Cardiovascular: Normal rate, regular rhythm and normal heart sounds.   No murmur heard. Pulmonary/Chest: Effort normal and breath sounds normal. He has no wheezes.  Lymphadenopathy:    He has no cervical adenopathy.  Neurological: He exhibits normal muscle tone.  Skin: Skin is warm and dry.  Nursing note and vitals reviewed.         Assessment & Plan:  Patient has generalized anxiety disorder. He states when he takes low-dose Xanax as well as his tramadol helps keep his aches under decent control plus also helps him with anxiousness.  Patient states  medication does not cause him to be drowsy or off balance  He does try to stay active but he states that his age he is limited in the amount of activity he can do  Mild sinus symptoms antibiotics prescribed he should follow-up in 3 months time

## 2016-08-06 ENCOUNTER — Encounter: Payer: Self-pay | Admitting: Family Medicine

## 2016-08-06 ENCOUNTER — Ambulatory Visit (HOSPITAL_COMMUNITY)
Admission: RE | Admit: 2016-08-06 | Discharge: 2016-08-06 | Disposition: A | Discharge status: Home / Self Care | Payer: Medicare Other | Source: Ambulatory Visit | Attending: Family Medicine | Admitting: Family Medicine

## 2016-08-06 ENCOUNTER — Ambulatory Visit (INDEPENDENT_AMBULATORY_CARE_PROVIDER_SITE_OTHER): Payer: Medicare Other | Admitting: Family Medicine

## 2016-08-06 ENCOUNTER — Other Ambulatory Visit (HOSPITAL_COMMUNITY)
Admission: RE | Admit: 2016-08-06 | Discharge: 2016-08-06 | Disposition: A | Discharge status: Home / Self Care | Payer: Medicare Other | Source: Ambulatory Visit | Attending: *Deleted | Admitting: *Deleted

## 2016-08-06 VITALS — BP 100/72 | Temp 98.2°F | Ht 68.5 in | Wt 158.5 lb

## 2016-08-06 DIAGNOSIS — R64 Cachexia: Secondary | ICD-10-CM | POA: Diagnosis not present

## 2016-08-06 DIAGNOSIS — J439 Emphysema, unspecified: Secondary | ICD-10-CM | POA: Diagnosis not present

## 2016-08-06 DIAGNOSIS — R0602 Shortness of breath: Secondary | ICD-10-CM | POA: Diagnosis not present

## 2016-08-06 DIAGNOSIS — R0902 Hypoxemia: Secondary | ICD-10-CM

## 2016-08-06 LAB — CBC WITH DIFFERENTIAL/PLATELET
Basophils Absolute: 0 10*3/uL (ref 0.0–0.1)
Basophils Relative: 0 %
EOS PCT: 1 %
Eosinophils Absolute: 0.1 10*3/uL (ref 0.0–0.7)
HEMATOCRIT: 50.6 % (ref 39.0–52.0)
HEMOGLOBIN: 17.5 g/dL — AB (ref 13.0–17.0)
LYMPHS ABS: 0.9 10*3/uL (ref 0.7–4.0)
LYMPHS PCT: 11 %
MCH: 32 pg (ref 26.0–34.0)
MCHC: 34.6 g/dL (ref 30.0–36.0)
MCV: 92.5 fL (ref 78.0–100.0)
Monocytes Absolute: 0.9 10*3/uL (ref 0.1–1.0)
Monocytes Relative: 12 %
NEUTROS ABS: 5.7 10*3/uL (ref 1.7–7.7)
NEUTROS PCT: 76 %
Platelets: 172 10*3/uL (ref 150–400)
RBC: 5.47 MIL/uL (ref 4.22–5.81)
RDW: 12.8 % (ref 11.5–15.5)
WBC: 7.5 10*3/uL (ref 4.0–10.5)

## 2016-08-06 LAB — BASIC METABOLIC PANEL
ANION GAP: 9 (ref 5–15)
BUN: 17 mg/dL (ref 6–20)
CALCIUM: 9.3 mg/dL (ref 8.9–10.3)
CHLORIDE: 97 mmol/L — AB (ref 101–111)
CO2: 31 mmol/L (ref 22–32)
Creatinine, Ser: 1.17 mg/dL (ref 0.61–1.24)
GFR calc Af Amer: >60 mL/min (ref >60)
GFR calc non Af Amer: 55 mL/min — ABNORMAL LOW (ref >60)
GLUCOSE: 120 mg/dL — AB (ref 65–99)
POTASSIUM: 4.6 mmol/L (ref 3.5–5.1)
Sodium: 137 mmol/L (ref 135–145)

## 2016-08-06 LAB — D-DIMER, QUANTITATIVE: D-Dimer, Quant: 0.47 ug/mL-FEU (ref 0.00–0.50)

## 2016-08-06 LAB — BRAIN NATRIURETIC PEPTIDE: B Natriuretic Peptide: 44 pg/mL (ref 0.0–100.0)

## 2016-08-06 MED ORDER — ALBUTEROL SULFATE HFA 108 (90 BASE) MCG/ACT IN AERS: 2.0000 | INHALATION_SPRAY | Freq: Four times a day (QID) | RESPIRATORY_TRACT | 2 refills | Status: DC | PRN

## 2016-08-06 MED ORDER — PREDNISONE 20 MG PO TABS: ORAL_TABLET | ORAL | 0 refills | Status: DC

## 2016-08-06 NOTE — Progress Notes (Addendum)
   Subjective:    Patient ID: Randy [name], male    DOB: [birth year], 83 y.o.   MRN: [id number]  Shortness of Breath  This is a chronic problem. The current episode started more than 1 month ago. The problem occurs constantly. The problem has been unchanged. Nothing aggravates the symptoms. The patient has no known risk factors for DVT/PE. Treatments tried: antibiotic. The treatment provided no relief.   This patient relates chronic onset of shortness of breath is been progressive over the past several months is now gotten to the point where he feels very short of breath when he moves around he denies high fever chills he does have a history of smoking in the past but none recently. Denies hemoptysis denies nausea vomiting diarrhea   Review of Systems  Respiratory: Positive for shortness of breath.    Please see above.    Objective:   Physical Exam Mild cachectic appearance lungs are clear hearts regular pulse normal extremities no edema skin warm dry  O2 saturation 84% on room air this oxygen saturation was done with the patient at rest. This should satisfy the government's regulatory aspects.     Assessment & Plan:  Cachexia Probable emphysema Chest x-ray pulmonary function tests ABG necessary Stat lab work ordered Albuterol when necessary Prednisone taper No need for antibiotics Patient was encouraged follow-up quickly if any problems Home health referral for evaluation and starting home O2  The patient felt that he could wait till tomorrow to start the oxygen we will get a stat referral  Face-to-face evaluation was done for this patient because of his advanced age and his severe COPD he is homebound. He requires family members to get him around. Patient would benefit from oxygen.

## 2016-08-07 ENCOUNTER — Telehealth: Payer: Self-pay | Admitting: Family Medicine

## 2016-08-07 NOTE — Telephone Encounter (Signed)
Minna Merritts from [facility] called in reference to patients oxygen.  She said the fax she received with office notes from yesterday is not sufficient, because it does not specifically say O2 at rest.  She says this will need to be amended in order to get his oxygen.  She says this is for a STAT O2 so this will need to be done today.   Fax corrected information to [phone].

## 2016-08-07 NOTE — Telephone Encounter (Signed)
I am certainly frustrated by this Randy [name] regulatory over reach. Of course his O2 saturation was at rest . Please call this individual let them know that the O2 sats ration that was done at the office visit was at rest. This patient needs his oxygen and needs it now if there is other delays in this I expect to be personally called or consulted with in order to address this in real time-I have amended this note. In must be faxed to this morning on Thursday. Also make sure that the contact person at [facility] was spoken with and let them know if there is any additional problems this needs to be handled on Thursday. I also 1 the nurses to call the patient after speaking with [facility] to let him know that he will get his oxygen on Thursday. There can be no further delays.

## 2016-08-08 ENCOUNTER — Telehealth: Payer: Self-pay | Admitting: Family Medicine

## 2016-08-08 NOTE — Telephone Encounter (Signed)
Good thank you

## 2016-08-08 NOTE — Telephone Encounter (Signed)
I did speak with patient regarding his oxygen on Wednesday night and he stated he received it

## 2016-08-08 NOTE — Telephone Encounter (Signed)
FYI  Message received from [facility] with [facility] this morning   Pt was set up 08/07/16 with his O2  Vaughan Basta talked with Mr. Lewark this morning and he said he can tell the oxygen is helping him.

## 2016-08-08 NOTE — Telephone Encounter (Signed)
Patient received Oxygen on Wednesday 08/07/16. Form was faxed and signed by MD in the office on yesterday.

## 2016-08-29 ENCOUNTER — Telehealth: Payer: Self-pay | Admitting: Family Medicine

## 2016-08-29 DIAGNOSIS — J439 Emphysema, unspecified: Secondary | ICD-10-CM

## 2016-08-29 NOTE — Telephone Encounter (Signed)
Referral in the system.

## 2016-08-29 NOTE — Telephone Encounter (Signed)
Patient says at his last visit with Dr. Nicki Reaper on 08/06/16 he was put on oxygen.  He said he is doing much better with the oxygen, but he thought that Dr. Nicki Reaper was going to set him up with another doctor to have him evaluated.  I do not see a referral in the system.  Please advise.

## 2016-08-29 NOTE — Telephone Encounter (Signed)
Please put him referral for pulmonology Dr. Luan Pulling

## 2016-09-02 ENCOUNTER — Encounter: Payer: Self-pay | Admitting: Family Medicine

## 2016-09-10 ENCOUNTER — Encounter: Payer: Self-pay | Admitting: Family Medicine

## 2016-09-16 DIAGNOSIS — H52223 Regular astigmatism, bilateral: Secondary | ICD-10-CM | POA: Diagnosis not present

## 2016-09-16 DIAGNOSIS — H5212 Myopia, left eye: Secondary | ICD-10-CM | POA: Diagnosis not present

## 2016-09-16 DIAGNOSIS — H33002 Unspecified retinal detachment with retinal break, left eye: Secondary | ICD-10-CM | POA: Diagnosis not present

## 2016-09-16 DIAGNOSIS — H5201 Hypermetropia, right eye: Secondary | ICD-10-CM | POA: Diagnosis not present

## 2016-10-02 DIAGNOSIS — N401 Enlarged prostate with lower urinary tract symptoms: Secondary | ICD-10-CM | POA: Diagnosis not present

## 2016-10-02 DIAGNOSIS — J449 Chronic obstructive pulmonary disease, unspecified: Secondary | ICD-10-CM | POA: Diagnosis not present

## 2016-10-02 DIAGNOSIS — I10 Essential (primary) hypertension: Secondary | ICD-10-CM | POA: Diagnosis not present

## 2016-10-02 DIAGNOSIS — J9611 Chronic respiratory failure with hypoxia: Secondary | ICD-10-CM | POA: Diagnosis not present

## 2016-10-07 ENCOUNTER — Ambulatory Visit (INDEPENDENT_AMBULATORY_CARE_PROVIDER_SITE_OTHER): Payer: Medicare Other | Admitting: Family Medicine

## 2016-10-07 ENCOUNTER — Encounter: Payer: Self-pay | Admitting: Family Medicine

## 2016-10-07 VITALS — BP 140/82 | Ht 69.0 in | Wt 161.1 lb

## 2016-10-07 DIAGNOSIS — R0902 Hypoxemia: Secondary | ICD-10-CM

## 2016-10-07 DIAGNOSIS — N401 Enlarged prostate with lower urinary tract symptoms: Secondary | ICD-10-CM

## 2016-10-07 DIAGNOSIS — F411 Generalized anxiety disorder: Secondary | ICD-10-CM | POA: Diagnosis not present

## 2016-10-07 DIAGNOSIS — J439 Emphysema, unspecified: Secondary | ICD-10-CM | POA: Diagnosis not present

## 2016-10-07 DIAGNOSIS — R350 Frequency of micturition: Secondary | ICD-10-CM | POA: Diagnosis not present

## 2016-10-07 MED ORDER — TRAMADOL HCL 50 MG PO TABS: ORAL_TABLET | ORAL | 5 refills | Status: DC

## 2016-10-07 MED ORDER — ALPRAZOLAM 0.5 MG PO TABS: ORAL_TABLET | ORAL | 5 refills | Status: DC

## 2016-10-07 NOTE — Progress Notes (Signed)
   Subjective:    Patient ID: Randy [name], male    DOB: 12/05/1928, 83 y.o.   MRN: [id number]  Hypertension  This is a chronic problem. The current episode started more than 1 year ago. Associated symptoms include shortness of breath. Pertinent negatives include no chest pain or headaches. There are no compliance problems.    Patient has concerns of inhaler prescribed by Dr.Hawkins. Also has concerns of SOB.  He states that he has noticed that when he uses Symbicort is sometimes make him feel jittery if he does not use a he does have some mild shortness of breath so it does help him it just makes him nervous feeling for 30-40 minutes when he uses it twice a day He denies any hemoptysis hematuria hematochezia denies chest tightness pressure pain denies abdominal pain PMH benign, has anxiety issues BPH issues and arthralgias stable on current regimens. Recent lab works review  Review of Systems  Constitutional: Negative for activity change, fatigue and fever.  Respiratory: Positive for shortness of breath. Negative for cough.   Cardiovascular: Negative for chest pain and leg swelling.  Neurological: Negative for headaches.   25 minutes was spent with the patient. Greater than half the time was spent in discussion and answering questions and counseling regarding the issues that the patient came in for today.     Objective:   Physical Exam  Constitutional: He appears well-nourished. No distress.  Cardiovascular: Normal rate, regular rhythm and normal heart sounds.   No murmur heard. Pulmonary/Chest: Effort normal and breath sounds normal. No respiratory distress.  Musculoskeletal: He exhibits no edema.  Lymphadenopathy:    He has no cervical adenopathy.  Neurological: He is alert.  Psychiatric: His behavior is normal.  Vitals reviewed.  On oxygen Some DOE but does good with the oxygen on        Assessment & Plan:  BPH-doing well on Flomax has to take 2 per day in order to get  significant effect denies drowsiness or dizziness  Significant emphysema issues with hypoxia using oxygen under the care of Dr. Luan Pulling, current Symbicort causing significant side effects of feeling jittery and headaches he states when he uses 1 puff twice daily does not do that. He states he is feeling okay on 1 puff twice daily. The patient was told that he may continue 1 puff twice daily follow-up with Dr. Luan Pulling in May as planned  Mild generalized anxiety he functions best when he is taking a half a Xanax 3 or 4 times a day does not cause drowsiness  Mild arthralgias and muscle aches patient takes tramadol 1 pill 3-4 times per day denies abusing it states it does not cause drowsiness he was given up-to-date on his refills  Recent lab work stable-I recommend this patient follow-up again with [place] in approximately 4-5 months sooner problems

## 2016-11-13 DIAGNOSIS — J9611 Chronic respiratory failure with hypoxia: Secondary | ICD-10-CM | POA: Diagnosis not present

## 2016-11-13 DIAGNOSIS — J449 Chronic obstructive pulmonary disease, unspecified: Secondary | ICD-10-CM | POA: Diagnosis not present

## 2016-11-13 DIAGNOSIS — I10 Essential (primary) hypertension: Secondary | ICD-10-CM | POA: Diagnosis not present

## 2016-11-13 DIAGNOSIS — N401 Enlarged prostate with lower urinary tract symptoms: Secondary | ICD-10-CM | POA: Diagnosis not present

## 2016-12-17 ENCOUNTER — Ambulatory Visit (INDEPENDENT_AMBULATORY_CARE_PROVIDER_SITE_OTHER): Payer: Medicare Other | Admitting: Urology

## 2016-12-17 ENCOUNTER — Other Ambulatory Visit (HOSPITAL_COMMUNITY)
Admission: RE | Admit: 2016-12-17 | Discharge: 2016-12-17 | Disposition: A | Discharge status: Home / Self Care | Payer: Medicare Other | Source: Ambulatory Visit | Attending: Urology | Admitting: Urology

## 2016-12-17 ENCOUNTER — Other Ambulatory Visit (HOSPITAL_COMMUNITY): Admission: RE | Admit: 2016-12-17 | Payer: Medicare Other | Admitting: Urology

## 2016-12-17 DIAGNOSIS — N401 Enlarged prostate with lower urinary tract symptoms: Secondary | ICD-10-CM

## 2016-12-17 DIAGNOSIS — C678 Malignant neoplasm of overlapping sites of bladder: Secondary | ICD-10-CM | POA: Diagnosis not present

## 2016-12-17 DIAGNOSIS — R8299 Other abnormal findings in urine: Secondary | ICD-10-CM | POA: Diagnosis not present

## 2016-12-25 ENCOUNTER — Other Ambulatory Visit: Payer: Self-pay | Admitting: *Deleted

## 2016-12-25 DIAGNOSIS — R911 Solitary pulmonary nodule: Secondary | ICD-10-CM

## 2016-12-31 ENCOUNTER — Ambulatory Visit (INDEPENDENT_AMBULATORY_CARE_PROVIDER_SITE_OTHER): Payer: Medicare Other | Admitting: Family Medicine

## 2016-12-31 ENCOUNTER — Encounter: Payer: Self-pay | Admitting: Family Medicine

## 2016-12-31 VITALS — BP 122/84 | Ht 69.0 in | Wt 162.5 lb

## 2016-12-31 DIAGNOSIS — R1032 Left lower quadrant pain: Secondary | ICD-10-CM | POA: Diagnosis not present

## 2016-12-31 LAB — POCT URINALYSIS DIPSTICK
RBC UA: NEGATIVE
SPEC GRAV UA: 1.015 (ref 1.010–1.025)
pH, UA: 6 (ref 5.0–8.0)

## 2016-12-31 MED ORDER — CEPHALEXIN 500 MG PO CAPS: ORAL_CAPSULE | ORAL | 0 refills | Status: DC

## 2016-12-31 NOTE — Progress Notes (Signed)
   Subjective:    Patient ID: Randy [name], male    DOB: March 31, 1929, 83 y.o.   MRN: [id number]  Abdominal Pain  This is a new problem. The onset quality is gradual. The problem occurs 2 to 4 times per day. The pain is located in the LLQ. The pain is at a severity of 5/10. The pain is moderate. The quality of the pain is aching and sharp. The abdominal pain does not radiate. Associated symptoms include nausea. Pertinent negatives include no diarrhea or fever. The pain is aggravated by certain positions. The pain is relieved by urination. He has tried nothing for the symptoms.   Patient in today for pain to groin area. Patient states onset a 2 weeks ago.  Truman Hayward see details above. Some difficulty with urination. Denies high fever chills sweats denies back pain. Does have history of BPH and elevated PSA is followed by urology. They did a prostate exam a week ago told him it was normal. Patient denies losing weight. States she's trying eat healthy. States no other concerns this visit.  Review of Systems  Constitutional: Negative for fatigue and fever.  HENT: Negative for congestion.   Respiratory: Negative for cough.   Gastrointestinal: Positive for abdominal pain and nausea. Negative for blood in stool, diarrhea and rectal pain.  Genitourinary: Positive for difficulty urinating. Negative for flank pain.       Objective:   Physical Exam  Constitutional: He appears well-nourished. No distress.  Cardiovascular: Normal rate, regular rhythm and normal heart sounds.   No murmur heard. Pulmonary/Chest: Effort normal and breath sounds normal. No respiratory distress.  Musculoskeletal: He exhibits no edema.  Lymphadenopathy:    He has no cervical adenopathy.  Neurological: He is alert.  Psychiatric: His behavior is normal.  Vitals reviewed.  Does have subjective discomfort in the left lower quadrant       Assessment & Plan:  We will go ahead and do lab work to look at this CBC pending Stool  tests ordered Await the results Urinalysis with some WBCs Urine culture sent Antibiotics prescribed Based upon results of the tests may need to do scan as well Follow-up in September Sooner problems.

## 2017-01-01 LAB — BASIC METABOLIC PANEL
BUN/Creatinine Ratio: 17 (ref 10–24)
BUN: 20 mg/dL (ref 8–27)
CHLORIDE: 92 mmol/L — AB (ref 96–106)
CO2: 33 mmol/L — AB (ref 20–29)
CREATININE: 1.16 mg/dL (ref 0.76–1.27)
Calcium: 9.1 mg/dL (ref 8.6–10.2)
GFR calc Af Amer: 66 mL/min/{1.73_m2} (ref >59)
GFR calc non Af Amer: 57 mL/min/{1.73_m2} — ABNORMAL LOW (ref >59)
GLUCOSE: 109 mg/dL — AB (ref 65–99)
Potassium: 4.7 mmol/L (ref 3.5–5.2)
SODIUM: 137 mmol/L (ref 134–144)

## 2017-01-01 LAB — HEPATIC FUNCTION PANEL
ALK PHOS: 62 IU/L (ref 39–117)
ALT: 18 IU/L (ref 0–44)
AST: 18 IU/L (ref 0–40)
Albumin: 4.3 g/dL (ref 3.5–4.7)
Bilirubin Total: 0.3 mg/dL (ref 0.0–1.2)
Bilirubin, Direct: 0.11 mg/dL (ref 0.00–0.40)
TOTAL PROTEIN: 6.7 g/dL (ref 6.0–8.5)

## 2017-01-01 LAB — CBC WITH DIFFERENTIAL/PLATELET
BASOS ABS: 0 10*3/uL (ref 0.0–0.2)
Basos: 0 %
EOS (ABSOLUTE): 0.2 10*3/uL (ref 0.0–0.4)
Eos: 4 %
HEMOGLOBIN: 14.2 g/dL (ref 13.0–17.7)
Hematocrit: 43.2 % (ref 37.5–51.0)
IMMATURE GRANS (ABS): 0 10*3/uL (ref 0.0–0.1)
Immature Granulocytes: 0 %
LYMPHS: 26 %
Lymphocytes Absolute: 1.7 10*3/uL (ref 0.7–3.1)
MCH: 30.9 pg (ref 26.6–33.0)
MCHC: 32.9 g/dL (ref 31.5–35.7)
MCV: 94 fL (ref 79–97)
MONOCYTES: 11 %
Monocytes Absolute: 0.7 10*3/uL (ref 0.1–0.9)
NEUTROS PCT: 59 %
Neutrophils Absolute: 3.9 10*3/uL (ref 1.4–7.0)
PLATELETS: 208 10*3/uL (ref 150–379)
RBC: 4.59 x10E6/uL (ref 4.14–5.80)
RDW: 13 % (ref 12.3–15.4)
WBC: 6.5 10*3/uL (ref 3.4–10.8)

## 2017-01-02 ENCOUNTER — Other Ambulatory Visit: Payer: Self-pay

## 2017-01-02 DIAGNOSIS — R1032 Left lower quadrant pain: Secondary | ICD-10-CM

## 2017-01-02 LAB — URINE CULTURE

## 2017-01-03 ENCOUNTER — Other Ambulatory Visit: Payer: Self-pay | Admitting: *Deleted

## 2017-01-03 ENCOUNTER — Telehealth: Payer: Self-pay | Admitting: *Deleted

## 2017-01-03 DIAGNOSIS — R1032 Left lower quadrant pain: Secondary | ICD-10-CM

## 2017-01-03 LAB — IFOBT (OCCULT BLOOD): IFOBT: POSITIVE

## 2017-01-03 NOTE — Telephone Encounter (Signed)
Positive IFOBT test. Results put in epic

## 2017-01-05 NOTE — Telephone Encounter (Signed)
See result know. Please put in referral for gastroenterology

## 2017-01-06 ENCOUNTER — Other Ambulatory Visit: Payer: Self-pay | Admitting: *Deleted

## 2017-01-06 ENCOUNTER — Other Ambulatory Visit: Payer: Self-pay | Admitting: Urology

## 2017-01-06 DIAGNOSIS — K921 Melena: Secondary | ICD-10-CM

## 2017-01-06 MED ORDER — SULFAMETHOXAZOLE-TRIMETHOPRIM 800-160 MG PO TABS: 1.0000 | ORAL_TABLET | Freq: Two times a day (BID) | ORAL | 0 refills | Status: DC

## 2017-01-06 NOTE — Telephone Encounter (Signed)
Discussed with pt. Pt verbalized understanding. Order for referral put in.

## 2017-01-08 ENCOUNTER — Ambulatory Visit (HOSPITAL_COMMUNITY)
Admission: RE | Admit: 2017-01-08 | Discharge: 2017-01-08 | Disposition: A | Discharge status: Home / Self Care | Payer: Medicare Other | Source: Ambulatory Visit | Attending: Family Medicine | Admitting: Family Medicine

## 2017-01-08 DIAGNOSIS — R911 Solitary pulmonary nodule: Secondary | ICD-10-CM | POA: Diagnosis not present

## 2017-01-08 DIAGNOSIS — R1032 Left lower quadrant pain: Secondary | ICD-10-CM | POA: Insufficient documentation

## 2017-01-08 DIAGNOSIS — K760 Fatty (change of) liver, not elsewhere classified: Secondary | ICD-10-CM | POA: Insufficient documentation

## 2017-01-08 DIAGNOSIS — I700 Atherosclerosis of aorta: Secondary | ICD-10-CM | POA: Diagnosis not present

## 2017-01-08 DIAGNOSIS — I77811 Abdominal aortic ectasia: Secondary | ICD-10-CM | POA: Diagnosis not present

## 2017-01-08 DIAGNOSIS — K4090 Unilateral inguinal hernia, without obstruction or gangrene, not specified as recurrent: Secondary | ICD-10-CM | POA: Diagnosis not present

## 2017-01-08 DIAGNOSIS — R918 Other nonspecific abnormal finding of lung field: Secondary | ICD-10-CM | POA: Insufficient documentation

## 2017-01-08 DIAGNOSIS — K5730 Diverticulosis of large intestine without perforation or abscess without bleeding: Secondary | ICD-10-CM | POA: Insufficient documentation

## 2017-01-08 MED ORDER — IOPAMIDOL (ISOVUE-300) INJECTION 61%
100.0000 mL | Freq: Once | INTRAVENOUS | Status: AC | PRN
  Administered 2017-01-08: 100 mL via INTRAVENOUS

## 2017-01-09 NOTE — Patient Instructions (Signed)
Randy [name]  01/09/2017     @PREFPERIOPPHARMACY @   Your procedure is scheduled on 01/14/2017.  Report to [name] at 6:15 A.M.  Call this number if you have problems the morning of surgery:  [phone]   Remember:  Do not eat food or drink liquids after midnight.  Take these medicines the morning of surgery with A SIP OF WATER Albuterol inhaler and bring with you, Xanax, Pepcid, Toprol, Flomax, Tramadol if  needed    Do not wear jewelry, make-up or nail polish.  Do not wear lotions, powders, or perfumes, or deoderant.  Do not shave 48 hours prior to surgery.  Men may shave face and neck.  Do not bring valuables to the hospital.  [facility] is not responsible for any belongings or valuables.  Contacts, dentures or bridgework may not be worn into surgery.  Leave your suitcase in the car.  After surgery it may be brought to your room.  For patients admitted to the hospital, discharge time will be determined by your treatment team.  Patients discharged the day of surgery will not be allowed to drive home.    Please read over the following fact sheets that you were given. Surgical Site Infection Prevention and Anesthesia Post-op Instructions     PATIENT INSTRUCTIONS POST-ANESTHESIA  IMMEDIATELY FOLLOWING SURGERY:  Do not drive or operate machinery for the first twenty four hours after surgery.  Do not make any important decisions for twenty four hours after surgery or while taking narcotic pain medications or sedatives.  If you develop intractable nausea and vomiting or a severe headache please notify your doctor immediately.  FOLLOW-UP:  Please make an appointment with your surgeon as instructed. You do not need to follow up with anesthesia unless specifically instructed to do so.  WOUND CARE INSTRUCTIONS (if applicable):  Keep a dry clean dressing on the anesthesia/puncture wound site if there is drainage.  Once the wound has quit draining you may leave it open to air.   Generally you should leave the bandage intact for twenty four hours unless there is drainage.  If the epidural site drains for more than 36-48 hours please call the anesthesia department.  QUESTIONS?:  Please feel free to call your physician or the hospital operator if you have any questions, and they will be happy to assist you.      Bladder Biopsy A bladder biopsy is a procedure to remove a small sample of tissue from the bladder. The procedure is done so that the tissue can be examined under a microscope. You may have a bladder biopsy to diagnose or rule out bladder cancer. During your bladder biopsy, your health care provider may insert a long, thin scope with a tiny lighted camera (cystoscope) into the tube that leads from your bladder to the outside of your body (urethra) and move it into your bladder. The cystoscope will allow your health care provider to examine the lining of the urethra and bladder and remove the tissue sample. If your health provider also needs to examine the tubes that connect the kidneys to the bladder (ureters), a longer tube (ureteroscope) may be used. Tell a health care provider about:  Any allergies you have.  All medicines you are taking, including vitamins, herbs, eye drops, creams, and over-the-counter medicines.  Any problems you or family members have had with anesthetic medicines.  Any blood disorders you have.  Any surgeries you have had.  Any medical conditions you have.  Whether you  are pregnant or may be pregnant. What are the risks? Generally, this is a safe procedure. However, problems may occur, including:  Unusual bleeding.  Infection, especially a urinary tract infection (UTI).  Allergic reactions to medicines.  Damage to the surrounding organs, including the urethra, bladder, or ureters.  Abdominal pain.  Burning or pain during urination.  Narrowing of the urethra due to scar tissue.  Difficulty urinating due to  swelling.  What happens before the procedure?  You may be given antibiotic medicine to help prevent infection.  Ask your health care provider about: ? Changing or stopping your regular medicines. This is especially important if you are taking diabetes medicines or blood thinners. ? Taking medicines such as aspirin and ibuprofen. These medicines can thin your blood. Do not take these medicines before your procedure if your health care provider instructs you not to.  Follow instructions from your health care provider about eating and drinking restrictions.  You may be asked to drink plenty of fluids.  You may be asked to urinate right before the procedure. You may have a urine sample taken for UTI testing.  Plan to have someone take you home from the hospital or clinic.  If you will be going home right after the procedure, plan to have someone with you for 24 hours. What happens during the procedure?  To lower your risk of infection: ? Your health care team will wash or sanitize their hands. ? Your skin will be washed with soap.  An IV tube may be inserted into one of your veins.  You may be given one or more of the following: ? A medicine to help you relax (sedative). ? A medicine to numb the opening of the urethra (local anesthetic). ? A medicine to make you fall asleep (general anesthetic). ? A medicine that is injected into your spine to numb the area below and slightly above the injection site (spinal anesthetic). ? A medicine that is injected into an area of your body to numb everything below the injection site (regional anesthetic).  You will lie on your back with your knees bent and spread apart.  The cystoscope or ureteroscope will be inserted into your urethra and guided into your bladder or ureters.  Your bladder may be slowly filled with germ-free (sterile) water. This will make it easier for your health care provider to view the wall or lining of your  bladder.  Small instruments will be inserted through the scope to collect a small tissue sample that will be examined under a microscope. The procedure may vary among health care providers and hospitals. What happens after the procedure?  You may be asked to empty your bladder, or your bladder may be emptied for you.  Your blood pressure, heart rate, breathing rate, and blood oxygen level will be monitored until the medicines you were given wear off.  Do not drive for 24 hours if you received a sedative. This information is not intended to replace advice given to you by your health care provider. Make sure you discuss any questions you have with your health care provider. Document Released: 07/04/2015 Document Revised: 11/23/2015 Document Reviewed: 07/04/2015 Elsevier Interactive Patient Education  [employer]. Cystoscopy Cystoscopy is a procedure that is used to help diagnose and sometimes treat conditions that affect that lower urinary tract. The lower urinary tract includes the bladder and the tube that drains urine from the bladder out of the body (urethra). Cystoscopy is performed with a thin, tube-shaped  instrument with a light and camera at the end (cystoscope). The cystoscope may be hard (rigid) or flexible, depending on the goal of the procedure.The cystoscope is inserted through the urethra, into the bladder. Cystoscopy may be recommended if you have:  Urinary tractinfections that keep coming back (recurring).  Blood in the urine (hematuria).  Loss of bladder control (urinary incontinence) or an overactive bladder.  Unusual cells found in a urine sample.  A blockage in the urethra.  Painful urination.  An abnormality in the bladder found during an intravenous pyelogram (IVP) or CT scan.  Cystoscopy may also be done to remove a sample of tissue to be examined under a microscope (biopsy). Tell a health care provider about:  Any allergies you have.  All medicines  you are taking, including vitamins, herbs, eye drops, creams, and over-the-counter medicines.  Any problems you or family members have had with anesthetic medicines.  Any blood disorders you have.  Any surgeries you have had.  Any medical conditions you have.  Whether you are pregnant or may be pregnant. What are the risks? Generally, this is a safe procedure. However, problems may occur, including:  Infection.  Bleeding.  Allergic reactions to medicines.  Damage to other structures or organs.  What happens before the procedure?  Ask your health care provider about: ? Changing or stopping your regular medicines. This is especially important if you are taking diabetes medicines or blood thinners. ? Taking medicines such as aspirin and ibuprofen. These medicines can thin your blood. Do not take these medicines before your procedure if your health care provider instructs you not to.  Follow instructions from your health care provider about eating or drinking restrictions.  You may be given antibiotic medicine to help prevent infection.  You may have an exam or testing, such as X-rays of the bladder, urethra, or kidneys.  You may have urine tests to check for signs of infection.  Plan to have someone take you home after the procedure. What happens during the procedure?  To reduce your risk of infection,your health care team will wash or sanitize their hands.  You will be given one or more of the following: ? A medicine to help you relax (sedative). ? A medicine to numb the area (local anesthetic).  The area around the opening of your urethra will be cleaned.  The cystoscope will be passed through your urethra into your bladder.  Germ-free (sterile)fluid will flow through the cystoscope to fill your bladder. The fluid will stretch your bladder so that your surgeon can clearly examine your bladder walls.  The cystoscope will be removed and your bladder will be  emptied. The procedure may vary among health care providers and hospitals. What happens after the procedure?  You may have some soreness or pain in your abdomen and urethra. Medicines will be available to help you.  You may have some blood in your urine.  Do not drive for 24 hours if you received a sedative. This information is not intended to replace advice given to you by your health care provider. Make sure you discuss any questions you have with your health care provider. Document Released: 06/14/2000 Document Revised: 10/26/2015 Document Reviewed: 05/04/2015 Elsevier Interactive Patient Education  2017 [employer].

## 2017-01-10 ENCOUNTER — Encounter (HOSPITAL_COMMUNITY)
Admission: RE | Admit: 2017-01-10 | Discharge: 2017-01-10 | Disposition: A | Discharge status: Home / Self Care | Payer: Medicare Other | Source: Ambulatory Visit | Attending: Urology | Admitting: Urology

## 2017-01-10 ENCOUNTER — Encounter (HOSPITAL_COMMUNITY): Payer: Self-pay

## 2017-01-10 DIAGNOSIS — Z01818 Encounter for other preprocedural examination: Secondary | ICD-10-CM | POA: Insufficient documentation

## 2017-01-10 HISTORY — DX: Dyspnea, unspecified: R06.00

## 2017-01-10 LAB — BASIC METABOLIC PANEL
ANION GAP: 8 (ref 5–15)
BUN: 19 mg/dL (ref 6–20)
CALCIUM: 9 mg/dL (ref 8.9–10.3)
CHLORIDE: 91 mmol/L — AB (ref 101–111)
CO2: 32 mmol/L (ref 22–32)
CREATININE: 1.37 mg/dL — AB (ref 0.61–1.24)
GFR calc Af Amer: 52 mL/min — ABNORMAL LOW (ref >60)
GFR calc non Af Amer: 45 mL/min — ABNORMAL LOW (ref >60)
GLUCOSE: 93 mg/dL (ref 65–99)
Potassium: 5.2 mmol/L — ABNORMAL HIGH (ref 3.5–5.1)
Sodium: 131 mmol/L — ABNORMAL LOW (ref 135–145)

## 2017-01-10 LAB — CBC
HEMATOCRIT: 41.1 % (ref 39.0–52.0)
HEMOGLOBIN: 13.8 g/dL (ref 13.0–17.0)
MCH: 31 pg (ref 26.0–34.0)
MCHC: 33.6 g/dL (ref 30.0–36.0)
MCV: 92.4 fL (ref 78.0–100.0)
Platelets: 206 10*3/uL (ref 150–400)
RBC: 4.45 MIL/uL (ref 4.22–5.81)
RDW: 12.2 % (ref 11.5–15.5)
WBC: 6.5 10*3/uL (ref 4.0–10.5)

## 2017-01-13 NOTE — H&P (Signed)
H&P  Chief Complaint: History of bladder cancer  History of Present Illness: 83 year old male with a history of bladder cancer.  He has had multiple treatments in our office.  Recent cystoscopy revealed suspicious area, cytology confirmed probability of bladder cancer.  He is here today for cystoscopy, TURBT, bilateral retrograde studies.  His history is outlined below:  He underwent combined TURBT and TURP on 01/06/2012.   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 It was thought that this was part of the adjacent tumor in the bladder. There was no evidence of invasion regarding the urothelial carcinoma specimen from the bladder. Muscle was present in the specimen.   He was found to have a bladder neck contracture this was treated with anesthetic cystoscopy, bladder neck incision/dilation and cystoscopy on 05/01/2012. At that time, he was found to have a urothelial abnormality of the bladder. Biopsy revealed carcinoma in situ.   He completed BCG induction on 07/07/2012.   Cystoscopy, cytology/FISH on 08/18/2012 were negative.   He was found to have a new-onset prostate nodule as well as elevated PSA (4.49), he underwent combined cystoscopy, bladder biopsy and transrectal ultrasound and biopsy of his prostate on 11/17/2012. Bladder biopsies were all benign with some inflammatory change but no neoplasia. Prostate biopsies revealed granulomatous changes consistent with BCG administration. He tolerated the procedure well.   He completed his BCG on 03/16/2013 he had a difficult time with dysuria, but this responded well to Uribel.  Follow-up cystoscopy with cytology/FISH on 04/27/2013 was negative.  Follow-up cystoscopy with cytology on 07/28/2013 was negative  Follow-up cystoscopy in June, 2015 was negative. Cytology was atypical, but FISH was negative.  Follow-up cystoscopy in November 2015 was negative. Atypical findings noted on  cytology, FISH test was negative.  Follow-up cystoscopy in August 2016 was negative  Follow-up cystoscopy on 06/20/2015 was negative. Cytology revealed atypical findings.  Follow-up cystoscopy in June/2016 negative. Cytology again revealed atypical findings.  Follow-up cystoscopy in December, 2016 was negative. Cytology revealed rare atypical cell.     Past Medical History:  Diagnosis Date  . Allergy   . Anxiety    claustraphobia  . BNC (bladder neck contracture)   . Cancer [facility])    bladder cancer  . Cervical spondylosis   . Claustrophobia   . Complication of anesthesia    claustraphobia  . COPD (chronic obstructive pulmonary disease) ([place])   . DJD (degenerative joint disease)    cervical  . Dyspnea   . Dysuria OCCASIONAL  . GERD (gastroesophageal reflux disease)    takes prilosec intermittently  . Hiatal hernia   . History of BPH   . Hx of bladder cancer   . Hypertension   . Impaired fasting glucose   . Status post dilation of esophageal narrowing   . Urinary hesitancy     Past Surgical History:  Procedure Laterality Date  . CARDIOVASCULAR STRESS TEST  11-08-2009  DR ROTHBART   LOW RISK MYOVIEW/ NO ISCHEMIA/ LVEF 64%  . CATARACT EXTRACTION W/ INTRAOCULAR LENS  IMPLANT, BILATERAL    . CHOLECYSTECTOMY  1984   OPEN  . collapsed lung  1965  . CYSTOSCOPY WITH BIOPSY N/A 11/17/2012   Procedure: CYSTOSCOPY WITH BIOPSY;  Surgeon: Franchot Gallo, MD;  Location: [facility];  Service: Urology;  Laterality: N/A;  . LAPAROSCOPIC REPAIR LARGE HIATAL HERNIA W/ MESH AND NISSEN FUNDOPLICATION  [phone]  . PROSTATE BIOPSY N/A 11/17/2012   Procedure: BIOPSY TRANSRECTAL ULTRASONIC PROSTATE (TUBP);  Surgeon: Franchot Gallo, MD;  Location: [facility];  Service: Urology;  Laterality: N/A;  45 MINS 1. TRANSRECTAL ULTRASOUND OF PROSTATE WITH BIOPSY -will need ultrasound tech 2.CYSTOSCOPY, BLADDER BIOPSY - need resectoscope- not [employer] [id number] Medicare -[id number] BCBS - [id number]  .  REPOSITION OF LENS Right 03/31/2014   Procedure: REPOSITIONING OF INTRAOCULAR LENS IMPLANT RIGHT EYE;  Surgeon: Tonny Branch, MD;  Location: [facility];  Service: Ophthalmology;  Laterality: Right;  . RETINAL DETACHMENT SURGERY  1988  . TRANSURETHRAL RESECTION OF BLADDER NECK  05/01/2012   Procedure: TRANSURETHRAL RESECTION OF BLADDER NECK;  Surgeon: Franchot Gallo, MD;  [facility];  Service: Urology;  Laterality: N/A;  [place]   . TRANSURETHRAL RESECTION OF BLADDER TUMOR  01/06/2012   Procedure: TRANSURETHRAL RESECTION OF BLADDER TUMOR (TURBT);  Surgeon: Franchot Gallo, MD;  Location: [facility];  Service: Urology;  Laterality: N/A;  . TRANSURETHRAL RESECTION OF PROSTATE    . TRANSURETHRAL RESECTION OF PROSTATE      Home Medications:  Allergies as of 01/13/2017      Reactions   Trazodone And Nefazodone Other (See Comments)   dizziness   Wellbutrin [bupropion] Nausea And Vomiting   Nausea dizziness      Medication List    Notice   Cannot display discharge medications because the patient has not yet been admitted.     Allergies:  Allergies  Allergen Reactions  . Trazodone And Nefazodone Other (See Comments)    dizziness  . Wellbutrin [Bupropion] Nausea And Vomiting    Nausea dizziness    Family History  Problem Relation Age of Onset  . Stroke Mother   . Kidney failure Father   . Heart disease Brother   . Colon cancer Neg Hx   . Esophageal cancer Neg Hx   . Rectal cancer Neg Hx   . Stomach cancer Neg Hx     Social History:  reports that he quit smoking about 30 years ago. His smoking use included Cigarettes. He has a 40.00 pack-year smoking history. He has never used smokeless tobacco. He reports that he does not drink alcohol or use drugs.  ROS: A complete review of systems was performed.  All systems are negative except for pertinent findings as noted.  Physical Exam:  Vital  signs in last 24 hours:   Constitutional:  Alert and oriented, No acute distress Cardiovascular: Regular rate and rhythm, No JVD Respiratory: Normal respiratory effort, Lungs clear bilaterally GI: Abdomen is soft, nontender, nondistended, no abdominal masses Genitourinary: No CVAT. Normal male phallus, testes are descended bilaterally and non-tender and without masses, scrotum is normal in appearance without lesions or masses, perineum is normal on inspection. Lymphatic: No lymphadenopathy Neurologic: Grossly intact, no focal deficits Psychiatric: Normal mood and affect  Laboratory Data:  No results for input(s): WBC, HGB, HCT, PLT in the last 72 hours.  No results for input(s): NA, K, CL, GLUCOSE, BUN, CALCIUM, CREATININE in the last 72 hours.  Invalid input(s): CO3   No results found for this or any previous visit (from the past 24 hour(s)). No results found for this or any previous visit (from the past 240 hour(s)).  Renal Function:  Recent Labs  01/10/17 1506  CREATININE 1.37*   Estimated Creatinine Clearance: 40 mL/min (A) (by C-G formula based on SCr of 1.37 mg/dL (H)).  Radiologic Imaging: No results found.  Impression/Assessment:  Possible recurrent bladder cancer  Plan:  Cystoscopy, TURBT, bilateral retrograde ureteropyelograms.

## 2017-01-14 ENCOUNTER — Ambulatory Visit (HOSPITAL_COMMUNITY)
Admission: RE | Admit: 2017-01-14 | Discharge: 2017-01-14 | Disposition: A | Discharge status: Home / Self Care | Payer: Medicare Other | Source: Ambulatory Visit | Attending: Urology | Admitting: Urology

## 2017-01-14 ENCOUNTER — Encounter: Payer: Self-pay | Admitting: Family Medicine

## 2017-01-14 ENCOUNTER — Ambulatory Visit (HOSPITAL_COMMUNITY): Payer: Medicare Other

## 2017-01-14 ENCOUNTER — Ambulatory Visit (HOSPITAL_COMMUNITY): Payer: Medicare Other | Admitting: Anesthesiology

## 2017-01-14 ENCOUNTER — Encounter (HOSPITAL_COMMUNITY)
Admission: RE | Disposition: A | Discharge status: Home / Self Care | Payer: Self-pay | Source: Ambulatory Visit | Attending: Urology

## 2017-01-14 DIAGNOSIS — N400 Enlarged prostate without lower urinary tract symptoms: Secondary | ICD-10-CM | POA: Diagnosis not present

## 2017-01-14 DIAGNOSIS — I10 Essential (primary) hypertension: Secondary | ICD-10-CM | POA: Diagnosis not present

## 2017-01-14 DIAGNOSIS — J449 Chronic obstructive pulmonary disease, unspecified: Secondary | ICD-10-CM | POA: Diagnosis not present

## 2017-01-14 DIAGNOSIS — N3080 Other cystitis without hematuria: Secondary | ICD-10-CM | POA: Insufficient documentation

## 2017-01-14 DIAGNOSIS — I739 Peripheral vascular disease, unspecified: Secondary | ICD-10-CM | POA: Insufficient documentation

## 2017-01-14 DIAGNOSIS — K219 Gastro-esophageal reflux disease without esophagitis: Secondary | ICD-10-CM | POA: Diagnosis not present

## 2017-01-14 DIAGNOSIS — C679 Malignant neoplasm of bladder, unspecified: Secondary | ICD-10-CM | POA: Diagnosis not present

## 2017-01-14 DIAGNOSIS — R896 Abnormal cytological findings in specimens from other organs, systems and tissues: Secondary | ICD-10-CM | POA: Diagnosis not present

## 2017-01-14 DIAGNOSIS — D090 Carcinoma in situ of bladder: Secondary | ICD-10-CM | POA: Insufficient documentation

## 2017-01-14 DIAGNOSIS — Z888 Allergy status to other drugs, medicaments and biological substances status: Secondary | ICD-10-CM | POA: Insufficient documentation

## 2017-01-14 DIAGNOSIS — Z87891 Personal history of nicotine dependence: Secondary | ICD-10-CM | POA: Diagnosis not present

## 2017-01-14 DIAGNOSIS — F40240 Claustrophobia: Secondary | ICD-10-CM | POA: Diagnosis not present

## 2017-01-14 DIAGNOSIS — Z8551 Personal history of malignant neoplasm of bladder: Secondary | ICD-10-CM | POA: Diagnosis not present

## 2017-01-14 DIAGNOSIS — N329 Bladder disorder, unspecified: Secondary | ICD-10-CM | POA: Diagnosis present

## 2017-01-14 DIAGNOSIS — M47812 Spondylosis without myelopathy or radiculopathy, cervical region: Secondary | ICD-10-CM | POA: Diagnosis not present

## 2017-01-14 DIAGNOSIS — D494 Neoplasm of unspecified behavior of bladder: Secondary | ICD-10-CM | POA: Diagnosis not present

## 2017-01-14 DIAGNOSIS — F419 Anxiety disorder, unspecified: Secondary | ICD-10-CM | POA: Insufficient documentation

## 2017-01-14 HISTORY — PX: CYSTOSCOPY W/ RETROGRADES: SHX1426

## 2017-01-14 HISTORY — PX: CYSTOSCOPY WITH BIOPSY: SHX5122

## 2017-01-14 SURGERY — CYSTOSCOPY, WITH BIOPSY
Anesthesia: General | Site: Ureter | Laterality: Bilateral

## 2017-01-14 MED ORDER — PROPOFOL 10 MG/ML IV BOLUS
INTRAVENOUS | Status: DC | PRN
  Administered 2017-01-14: 110 mg via INTRAVENOUS

## 2017-01-14 MED ORDER — DIATRIZOATE MEGLUMINE 30 % UR SOLN
URETHRAL | Status: AC
  Filled 2017-01-14: qty 300

## 2017-01-14 MED ORDER — DIATRIZOATE MEGLUMINE 30 % UR SOLN
URETHRAL | Status: DC | PRN
  Administered 2017-01-14: 20 mL via URETHRAL

## 2017-01-14 MED ORDER — ONDANSETRON HCL 4 MG/2ML IJ SOLN
4.0000 mg | Freq: Once | INTRAMUSCULAR | Status: AC
  Administered 2017-01-14: 4 mg via INTRAVENOUS

## 2017-01-14 MED ORDER — SULFAMETHOXAZOLE-TRIMETHOPRIM 800-160 MG PO TABS: 1.0000 | ORAL_TABLET | Freq: Two times a day (BID) | ORAL | 0 refills | Status: DC

## 2017-01-14 MED ORDER — MIDAZOLAM HCL 2 MG/2ML IJ SOLN
1.0000 mg | INTRAMUSCULAR | Status: AC
  Administered 2017-01-14 (×2): 2 mg via INTRAVENOUS
  Filled 2017-01-14: qty 2

## 2017-01-14 MED ORDER — STERILE WATER FOR IRRIGATION IR SOLN
Status: DC | PRN
  Administered 2017-01-14: 3000 mL

## 2017-01-14 MED ORDER — MIDAZOLAM HCL 2 MG/2ML IJ SOLN
INTRAMUSCULAR | Status: AC
  Filled 2017-01-14: qty 2

## 2017-01-14 MED ORDER — FENTANYL CITRATE (PF) 100 MCG/2ML IJ SOLN
INTRAMUSCULAR | Status: DC | PRN
  Administered 2017-01-14: 25 ug via INTRAVENOUS

## 2017-01-14 MED ORDER — ONDANSETRON HCL 4 MG/2ML IJ SOLN
INTRAMUSCULAR | Status: AC
  Filled 2017-01-14: qty 2

## 2017-01-14 MED ORDER — WATER FOR IRRIGATION, STERILE IR SOLN
Status: DC | PRN
  Administered 2017-01-14: 1000 mL

## 2017-01-14 MED ORDER — SODIUM CHLORIDE 0.9 % IR SOLN
Status: DC | PRN
  Administered 2017-01-14: 1000 mL

## 2017-01-14 MED ORDER — EPHEDRINE SULFATE 50 MG/ML IJ SOLN
INTRAMUSCULAR | Status: DC | PRN
  Administered 2017-01-14: 10 mg via INTRAVENOUS

## 2017-01-14 MED ORDER — LACTATED RINGERS IV SOLN
INTRAVENOUS | Status: DC
  Administered 2017-01-14: 1000 mL via INTRAVENOUS

## 2017-01-14 MED ORDER — FENTANYL CITRATE (PF) 100 MCG/2ML IJ SOLN
25.0000 ug | Freq: Once | INTRAMUSCULAR | Status: AC
  Administered 2017-01-14: 25 ug via INTRAVENOUS

## 2017-01-14 MED ORDER — FENTANYL CITRATE (PF) 100 MCG/2ML IJ SOLN
INTRAMUSCULAR | Status: AC
  Filled 2017-01-14: qty 2

## 2017-01-14 MED ORDER — LIDOCAINE HCL 2 % EX GEL
CUTANEOUS | Status: AC
  Filled 2017-01-14: qty 10

## 2017-01-14 MED ORDER — MIDAZOLAM HCL 5 MG/5ML IJ SOLN
INTRAMUSCULAR | Status: DC | PRN
  Administered 2017-01-14: 2 mg via INTRAVENOUS

## 2017-01-14 MED ORDER — CEFAZOLIN SODIUM-DEXTROSE 2-4 GM/100ML-% IV SOLN
2.0000 g | INTRAVENOUS | Status: AC
  Administered 2017-01-14: 2 g via INTRAVENOUS
  Filled 2017-01-14: qty 100

## 2017-01-14 SURGICAL SUPPLY — 31 items
BAG DRAIN URO TABLE W/ADPT NS (DRAPE) ×3 IMPLANT
BAG DRN 8 ADPR NS SKTRN CSTL (DRAPE) ×1
BAG HAMPER (MISCELLANEOUS) ×3 IMPLANT
BAG URINE LEG 500ML (DRAIN) ×2 IMPLANT
CATH FOLEY 2WAY SLVR  5CC 18FR (CATHETERS) ×2
CATH FOLEY 2WAY SLVR 5CC 18FR (CATHETERS) IMPLANT
CATH INTERMIT  6FR 70CM (CATHETERS) ×3 IMPLANT
CLOTH BEACON ORANGE TIMEOUT ST (SAFETY) ×3 IMPLANT
DECANTER SPIKE VIAL GLASS SM (MISCELLANEOUS) ×3 IMPLANT
ELECT REM PT RETURN 9FT ADLT (ELECTROSURGICAL) ×3
ELECTRODE REM PT RTRN 9FT ADLT (ELECTROSURGICAL) ×1 IMPLANT
FORMALIN 10 PREFIL 120ML (MISCELLANEOUS) ×5 IMPLANT
GLOVE BIO SURGEON STRL SZ8 (GLOVE) ×3 IMPLANT
GLOVE BIOGEL M 8.0 STRL (GLOVE) ×3 IMPLANT
GOWN STRL REUS W/TWL LRG LVL3 (GOWN DISPOSABLE) ×3 IMPLANT
GOWN STRL REUS W/TWL XL LVL3 (GOWN DISPOSABLE) ×6 IMPLANT
GUIDEWIRE STR DUAL SENSOR (WIRE) ×3 IMPLANT
KIT ROOM TURNOVER AP CYSTO (KITS) ×3 IMPLANT
MANIFOLD NEPTUNE II (INSTRUMENTS) ×3 IMPLANT
NDL HYPO 18GX1.5 BLUNT FILL (NEEDLE) ×1 IMPLANT
NEEDLE HYPO 18GX1.5 BLUNT FILL (NEEDLE) ×3 IMPLANT
NEEDLE HYPO 22GX1.5 SAFETY (NEEDLE) IMPLANT
NS IRRIG 1000ML POUR BTL (IV SOLUTION) ×3 IMPLANT
PACK CYSTO (CUSTOM PROCEDURE TRAY) ×3 IMPLANT
PAD ARMBOARD 7.5X6 YLW CONV (MISCELLANEOUS) ×3 IMPLANT
PAD TELFA 3X4 1S STER (GAUZE/BANDAGES/DRESSINGS) ×3 IMPLANT
SYR CONTROL 10ML LL (SYRINGE) IMPLANT
SYRINGE IRR TOOMEY STRL 70CC (SYRINGE) ×3 IMPLANT
TOWEL OR 17X26 4PK STRL BLUE (TOWEL DISPOSABLE) ×3 IMPLANT
WATER STERILE IRR 1000ML POUR (IV SOLUTION) ×3 IMPLANT
WATER STERILE IRR 3000ML UROMA (IV SOLUTION) ×3 IMPLANT

## 2017-01-14 NOTE — Anesthesia Procedure Notes (Signed)
Procedure Name: LMA Insertion Date/Time: 01/14/2017 7:39 AM Performed by: Charmaine Downs Pre-anesthesia Checklist: Patient identified, Patient being monitored, Emergency Drugs available, Timeout performed and Suction available Patient Re-evaluated:Patient Re-evaluated prior to induction Oxygen Delivery Method: Circle System Utilized Preoxygenation: Pre-oxygenation with 100% oxygen Induction Type: IV induction Ventilation: Mask ventilation without difficulty LMA: LMA inserted LMA Size: 4.0 Number of attempts: 1 Placement Confirmation: positive ETCO2 and breath sounds checked- equal and bilateral Tube secured with: Tape Dental Injury: Teeth and Oropharynx as per pre-operative assessment

## 2017-01-14 NOTE — Anesthesia Postprocedure Evaluation (Signed)
Anesthesia Post Note  Patient: Imaad Reuss  Procedure(s) Performed: Procedure(s) (LRB): CYSTOSCOPY WITH BIOPSY (Bilateral) CYSTOSCOPY WITH RETROGRADE PYELOGRAM (Bilateral)  Patient location during evaluation: PACU Anesthesia Type: General Level of consciousness: awake and alert and patient cooperative Pain management: pain level controlled Vital Signs Assessment: post-procedure vital signs reviewed and stable Respiratory status: spontaneous breathing, nonlabored ventilation, respiratory function stable and patient connected to nasal cannula oxygen Cardiovascular status: blood pressure returned to baseline Postop Assessment: no signs of nausea or vomiting Anesthetic complications: no     Last Vitals:  Vitals:   01/14/17 0822 01/14/17 0830  BP:  135/74  Pulse: 85 98  Resp: 20 20  Temp: (!) 36.2 C     Last Pain: There were no vitals filed for this visit.               Paitlyn Mcclatchey J

## 2017-01-14 NOTE — Discharge Instructions (Signed)

## 2017-01-14 NOTE — Anesthesia Postprocedure Evaluation (Signed)
Anesthesia Post Note  Patient: Randy [name]  Procedure(s) Performed: Procedure(s) (LRB): CYSTOSCOPY WITH BIOPSY (Bilateral) CYSTOSCOPY WITH RETROGRADE PYELOGRAM (Bilateral)  Patient location during evaluation: Short Stay Anesthesia Type: General Level of consciousness: awake and alert Pain management: satisfactory to patient Vital Signs Assessment: post-procedure vital signs reviewed and stable Respiratory status: spontaneous breathing and patient connected to nasal cannula oxygen Cardiovascular status: stable Postop Assessment: no signs of nausea or vomiting Anesthetic complications: no     Last Vitals:  Vitals:   01/14/17 0830 01/14/17 0841  BP: 135/74 135/77  Pulse: 98 (!) 103  Resp: 20 18  Temp:      Last Pain: There were no vitals filed for this visit.               Drucie Opitz

## 2017-01-14 NOTE — Op Note (Signed)
Preoperative diagnosis: History of bladder cancer with recent positive cytologies.  Postoperative diagnosis: Same  Principal procedure cystoscopy, bilateral retrograde ureteropyelograms, bilateral renal washings, fluoroscopic interpretation, bladder biopsies.  Surgeon: Diona Fanti  Anesthesia: Gen. with LMA.  Specimens: 1.  Right renal washings for cytology.  2.  Left renal washings for cytology.  3.  Bladder biopsies  Drains: 18 French Foley catheter, to leg bag.  Complications: None.  Estimated blood loss: Less than 5 mL  Indications: 83 year old male with history of non-muscle invasive high-grade bladder cancer.  The patient has had prior immunotherapy with BCG.  Recent cystoscopy revealed mild erythema in the left trigonal region.  There was an apparent diverticulum on the right bladder wall.  Cytologies  of blad of bladder washings.  Suspicion for recurrent urothelial carcinoma.  The patient presents at this time for cystoscopy, bladder biopsy, bilateral retrogrades and bladder washings.  Description of procedure: The patient was properly identified in the holding area.  He received preoperative IV antibiotics.  He was taken to the operating room where general anesthesia was administered with the LMA.  He is placed in the dorsolithotomy position.  Genitalia and perineum were prepped and draped.  Proper timeout was performed.  A 21 French panendoscope was advanced through his urethra which was inspected.  Upon passage.  No urethral lesions were noted.  Prostate revealed evidence of prior resection with no evident obstruction.  Minimal bladder neck narrowing.  The bladder was entered and inspected circumferentially.  Ureteral orifices were very close to the bladder neck but were of normal configuration.  Posteriorly in the urothelium, small brownish nodules were noted, most likely consistent with prior BCG treatment with granuloma formation.  No discrete urothelial lesions/papillary areas were  seen.  Inspection was carried out with both the 70 and 30 lenses.  Despite careful inspection, no bladder diverticulum was seen.  Right and left renal washings were obtained with saline using a 6 [place] open-ended catheter passed with fluoroscopic guidance.  These washings were sent separately for cytology labeled "right renal washings", and "left renal washings".  Following obtaining washings bilaterally, separate retrograde ureteropyelograms were performed.  On the right, Omnipaque was used to obtain images.  Ureter throughout, was of normal caliber, without filling defects or hydronephrosis.  High low calyceal system was filled gently with the Omnipaque, and revealed no filling defects, and normal pyelocalyceal anatomy.  On the left, Omnipaque was again used.  The ureter was normal, again without hydronephrosis, and of normal caliber.  No filling defects were noted.  Pyelocalyceal system was gently filled and was of normal appearance, without filling defects.  Following completion of the retrograde studies, cold cup biopsy forceps were placed.  5-6 cup biopsies were taken randomly, but in the posterior bladder area and the bladder neck.  Biopsies incorporated the small brownish nodules present in the posterior bladder.  Care was taken to avoid injury to the ureteral orifices.  1 Biopsy was taken in the mid bladder neck.  All biopsies were then sent labeled "bladder biopsies".  Following completion of the biopsies, the Bugbee electrode was used to cauterize the biopsy sites.  2.  Right adequate hemostasis.  At this point, the scope was removed.  An 84 French Foley catheter was placed, the balloon filled, and this was hooked to a leg bag.  At this point, the procedure was terminated.  The patient was awakened and taken to the PACU.  He tolerated procedure well.

## 2017-01-14 NOTE — Anesthesia Preprocedure Evaluation (Addendum)
Anesthesia Evaluation  Patient identified by MRN, date of birth, ID band Patient awake    Reviewed: Allergy & Precautions, H&P , NPO status , Patient's Chart, lab work & pertinent test results  Airway Mallampati: II  TM Distance: <3 FB Neck ROM: Full    Dental  (+) Edentulous Upper, Edentulous Lower   Pulmonary neg pulmonary ROS, shortness of breath, COPD, former smoker,    Pulmonary exam normal breath sounds clear to auscultation       Cardiovascular hypertension, + Peripheral Vascular Disease and + DOE  negative cardio ROS   Rhythm:Regular Rate:Normal     Neuro/Psych PSYCHIATRIC DISORDERS ( claustraphobia) Anxiety negative neurological ROS     GI/Hepatic Neg liver ROS, GERD  Medicated and Controlled,  Endo/Other  negative endocrine ROS  Renal/GU negative Renal ROS  negative genitourinary   Musculoskeletal negative musculoskeletal ROS (+)   Abdominal   Peds negative pediatric ROS (+)  Hematology negative hematology ROS (+)   Anesthesia Other Findings Raspy voice this am.  Reproductive/Obstetrics negative OB ROS                            Anesthesia Physical Anesthesia Plan  ASA: III  Anesthesia Plan: General   Post-op Pain Management:    Induction: Intravenous  PONV Risk Score and Plan:   Airway Management Planned: LMA  Additional Equipment:   Intra-op Plan:   Post-operative Plan:   Informed Consent: I have reviewed the patients History and Physical, chart, labs and discussed the procedure including the risks, benefits and alternatives for the proposed anesthesia with the patient or authorized representative who has indicated his/her understanding and acceptance.   Dental advisory given  Plan Discussed with: CRNA and Surgeon  Anesthesia Plan Comments: (4 LMA used previously)        Anesthesia Quick Evaluation

## 2017-01-14 NOTE — Addendum Note (Signed)
Addendum  created 01/14/17 0918 by Vista Deck, CRNA   Sign clinical note

## 2017-01-14 NOTE — Interval H&P Note (Signed)
History and Physical Interval Note:  01/14/2017 7:21 AM  [name]  has presented today for surgery, with the diagnosis of BLADDER CANCER  The various methods of treatment have been discussed with the patient and family. After consideration of risks, benefits and other options for treatment, the patient has consented to  Procedure(s) with comments: CYSTOSCOPY WITH BIOPSY (N/A) - 5 MINS [phone] MEDICARE-[id number] [id number]  FYI: PATIENT WANTS IT NOTED THAT HE IS CLAUSTROPHOBIC! CYSTOSCOPY WITH RETROGRADE PYELOGRAM (Bilateral) as a surgical intervention .  The patient's history has been reviewed, patient examined, no change in status, stable for surgery.  I have reviewed the patient's chart and labs.  Questions were answered to the patient's satisfaction.     Randy [name]

## 2017-01-14 NOTE — Transfer of Care (Signed)
Immediate Anesthesia Transfer of Care Note  Patient: Felipe Paluch  Procedure(s) Performed: Procedure(s) with comments: CYSTOSCOPY WITH BIOPSY (Bilateral) - 22 MINS [phone] MEDICARE-[id number] [id number] [id number]  FYI: PATIENT WANTS IT NOTED THAT HE IS CLAUSTROPHOBIC! CYSTOSCOPY WITH RETROGRADE PYELOGRAM (Bilateral)  Patient Location: PACU  Anesthesia Type:General  Level of Consciousness: awake, alert  and patient cooperative  Airway & Oxygen Therapy: Patient Spontanous Breathing and Patient connected to nasal cannula oxygen  Post-op Assessment: Report given to RN, Post -op Vital signs reviewed and stable and Patient moving all extremities  Post vital signs: Reviewed and stable  Last Vitals:  Vitals:   01/14/17 0715 01/14/17 0720  BP: (!) 116/92 131/73  Resp: 16 18  Temp:      Last Pain: There were no vitals filed for this visit.    Patients Stated Pain Goal: 6 (24/40/10 2725)  Complications: No apparent anesthesia complications

## 2017-01-15 ENCOUNTER — Encounter (HOSPITAL_COMMUNITY): Payer: Self-pay | Admitting: Urology

## 2017-01-15 ENCOUNTER — Encounter (INDEPENDENT_AMBULATORY_CARE_PROVIDER_SITE_OTHER): Payer: Self-pay

## 2017-01-15 ENCOUNTER — Encounter (INDEPENDENT_AMBULATORY_CARE_PROVIDER_SITE_OTHER): Payer: Self-pay | Admitting: Internal Medicine

## 2017-01-15 LAB — POCT I-STAT 4, (NA,K, GLUC, HGB,HCT)
GLUCOSE: 118 mg/dL — AB (ref 65–99)
HEMATOCRIT: 44 % (ref 39.0–52.0)
HEMOGLOBIN: 15 g/dL (ref 13.0–17.0)
Potassium: 4.7 mmol/L (ref 3.5–5.1)
Sodium: 138 mmol/L (ref 135–145)

## 2017-01-20 ENCOUNTER — Other Ambulatory Visit: Payer: Self-pay

## 2017-01-24 ENCOUNTER — Ambulatory Visit (INDEPENDENT_AMBULATORY_CARE_PROVIDER_SITE_OTHER): Payer: Medicare Other | Admitting: Family Medicine

## 2017-01-24 ENCOUNTER — Encounter: Payer: Self-pay | Admitting: Family Medicine

## 2017-01-24 VITALS — BP 122/76 | Ht 69.0 in | Wt 162.0 lb

## 2017-01-24 DIAGNOSIS — I700 Atherosclerosis of aorta: Secondary | ICD-10-CM

## 2017-01-24 DIAGNOSIS — N289 Disorder of kidney and ureter, unspecified: Secondary | ICD-10-CM

## 2017-01-24 DIAGNOSIS — I7789 Other specified disorders of arteries and arterioles: Secondary | ICD-10-CM | POA: Diagnosis not present

## 2017-01-24 DIAGNOSIS — E784 Other hyperlipidemia: Secondary | ICD-10-CM

## 2017-01-24 DIAGNOSIS — E7849 Other hyperlipidemia: Secondary | ICD-10-CM

## 2017-01-24 NOTE — Progress Notes (Signed)
   Subjective:    Patient ID: Randy [name], male    DOB: [birth year], 83 y.o.   MRN: [id number]  HPI Patient arrives to discuss results of recent CT scans. We discussed CT scan result detail Discussed his recent urologic surgery Patient states overall he feels he is doing good  Review of Systems Denies high fever chills sweats    Objective:   Physical Exam Has COPD Lungs clear Heart regular Extremities no edema Skin warm dry CT exam of the abdomen and chest discussed in detail  1520 minutes spent with the patient discussing all these items     Assessment & Plan:  Aortic atherosclerosis-seen on CAT scan- the importance of healthy diet discussed  Dilation of the aorta we will repeat CT scan in one years time  Stable other health issues follow-up for regular visit in September

## 2017-01-29 DIAGNOSIS — E784 Other hyperlipidemia: Secondary | ICD-10-CM | POA: Diagnosis not present

## 2017-01-29 DIAGNOSIS — N289 Disorder of kidney and ureter, unspecified: Secondary | ICD-10-CM | POA: Diagnosis not present

## 2017-01-30 ENCOUNTER — Encounter: Payer: Self-pay | Admitting: Family Medicine

## 2017-01-30 ENCOUNTER — Ambulatory Visit (INDEPENDENT_AMBULATORY_CARE_PROVIDER_SITE_OTHER): Payer: Medicare Other | Admitting: Internal Medicine

## 2017-01-30 LAB — BASIC METABOLIC PANEL
BUN / CREAT RATIO: 15 (ref 10–24)
BUN: 17 mg/dL (ref 8–27)
CO2: 29 mmol/L (ref 20–29)
Calcium: 9.1 mg/dL (ref 8.6–10.2)
Chloride: 94 mmol/L — ABNORMAL LOW (ref 96–106)
Creatinine, Ser: 1.14 mg/dL (ref 0.76–1.27)
GFR, EST AFRICAN AMERICAN: 67 mL/min/{1.73_m2} (ref >59)
GFR, EST NON AFRICAN AMERICAN: 58 mL/min/{1.73_m2} — AB (ref >59)
Glucose: 109 mg/dL — ABNORMAL HIGH (ref 65–99)
POTASSIUM: 4.7 mmol/L (ref 3.5–5.2)
SODIUM: 137 mmol/L (ref 134–144)

## 2017-01-30 LAB — LIPID PANEL
CHOLESTEROL TOTAL: 170 mg/dL (ref 100–199)
Chol/HDL Ratio: 3.3 ratio (ref 0.0–5.0)
HDL: 52 mg/dL (ref >39)
LDL Calculated: 95 mg/dL (ref 0–99)
TRIGLYCERIDES: 114 mg/dL (ref 0–149)
VLDL Cholesterol Cal: 23 mg/dL (ref 5–40)

## 2017-01-30 LAB — HEPATIC FUNCTION PANEL
ALT: 14 IU/L (ref 0–44)
AST: 17 IU/L (ref 0–40)
Albumin: 4.1 g/dL (ref 3.5–4.7)
Alkaline Phosphatase: 61 IU/L (ref 39–117)
BILIRUBIN TOTAL: 0.3 mg/dL (ref 0.0–1.2)
BILIRUBIN, DIRECT: 0.1 mg/dL (ref 0.00–0.40)
Total Protein: 6.7 g/dL (ref 6.0–8.5)

## 2017-02-05 ENCOUNTER — Ambulatory Visit (INDEPENDENT_AMBULATORY_CARE_PROVIDER_SITE_OTHER): Payer: Medicare Other | Admitting: Internal Medicine

## 2017-02-05 DIAGNOSIS — F419 Anxiety disorder, unspecified: Secondary | ICD-10-CM | POA: Diagnosis not present

## 2017-02-05 DIAGNOSIS — I10 Essential (primary) hypertension: Secondary | ICD-10-CM | POA: Diagnosis not present

## 2017-02-05 DIAGNOSIS — J449 Chronic obstructive pulmonary disease, unspecified: Secondary | ICD-10-CM | POA: Diagnosis not present

## 2017-02-05 DIAGNOSIS — J9611 Chronic respiratory failure with hypoxia: Secondary | ICD-10-CM | POA: Diagnosis not present

## 2017-02-11 ENCOUNTER — Encounter (INDEPENDENT_AMBULATORY_CARE_PROVIDER_SITE_OTHER): Payer: Self-pay | Admitting: *Deleted

## 2017-02-11 ENCOUNTER — Other Ambulatory Visit (INDEPENDENT_AMBULATORY_CARE_PROVIDER_SITE_OTHER): Payer: Self-pay | Admitting: Internal Medicine

## 2017-02-11 ENCOUNTER — Ambulatory Visit (INDEPENDENT_AMBULATORY_CARE_PROVIDER_SITE_OTHER): Payer: Medicare Other | Admitting: Internal Medicine

## 2017-02-11 ENCOUNTER — Telehealth (INDEPENDENT_AMBULATORY_CARE_PROVIDER_SITE_OTHER): Payer: Self-pay | Admitting: *Deleted

## 2017-02-11 ENCOUNTER — Encounter (INDEPENDENT_AMBULATORY_CARE_PROVIDER_SITE_OTHER): Payer: Self-pay | Admitting: Internal Medicine

## 2017-02-11 VITALS — BP 150/80 | HR 96 | Temp 98.1°F | Ht 69.0 in | Wt 161.8 lb

## 2017-02-11 DIAGNOSIS — R195 Other fecal abnormalities: Secondary | ICD-10-CM | POA: Insufficient documentation

## 2017-02-11 MED ORDER — PEG 3350-KCL-NA BICARB-NACL 420 G PO SOLR: 4000.0000 mL | Freq: Once | ORAL | 0 refills | Status: AC

## 2017-02-11 NOTE — Progress Notes (Signed)
Subjective:    Patient ID: Randy [name], male    DOB: 10-16-1928, 83 y.o.   MRN: [id number] Hx of COPD and maintained on 02 at 2 liters daily.  HPI Referred by Dr. Wolfgang Phoenix for positive stool card. Patient denies seeing any blood in his stool. No change in his stool. Occasionally has hard stools.  Appetite is good. No weight loss. He occasionally has lower abdominal pain . No problems with urination.   His last colonoscopy was in 2006 by Dr Laural Golden:   FINAL DIAGNOSIS:  1.  Pan colonic diverticulosis. Most of the diverticula are at sigmoid colon      and ascending colon but this are only few in number.  2.  A 6 to 7 mm polyp snared from cecum.  3.  Small external hemorrhoids.  DIAGNOSIS: 1) polyp (Cecum, polypectomy): INFLAMED ADENOMATOUS POLYP.   Hx of bladder cancer 4 yrs ago.  CBC    Component Value Date/Time   WBC 6.5 01/10/2017 1506   RBC 4.45 01/10/2017 1506   HGB 15.0 01/14/2017 0642   HGB 14.2 12/31/2016 0916   HCT 44.0 01/14/2017 0642   HCT 43.2 12/31/2016 0916   PLT 206 01/10/2017 1506   PLT 208 12/31/2016 0916   MCV 92.4 01/10/2017 1506   MCV 94 12/31/2016 0916   MCH 31.0 01/10/2017 1506   MCHC 33.6 01/10/2017 1506   RDW 12.2 01/10/2017 1506   RDW 13.0 12/31/2016 0916   LYMPHSABS 1.7 12/31/2016 0916   MONOABS 0.9 08/06/2016 1935   EOSABS 0.2 12/31/2016 0916   BASOSABS 0.0 12/31/2016 0916      Previous patient of Dr. Deatra Ina.   01/09/2017 CT abdomen/pelvis with CM;  IMPRESSION: The right aspect of the urinary bladder dome is herniated into a right inguinal hernia.  Sigmoid colonic diverticulosis without CT evidence for acute diverticulitis.  Aortic atherosclerosis and infrarenal abdominal aortic ectasia.  Stable 4 mm right middle lobe pulmonary nodule, most compatible with benign etiology.  Hepatic steatosis.  Ascending thoracic aorta measures 4.3 cm. Recommend annual imaging followup by CTA or MRA. This recommendation follows  2010 ACCF/AHA/AATS/ACR/ASA/SCA/SCAI/SIR/STS/SVM Guidelines for the Diagnosis and Management of Patients with Thoracic Aortic Disease. Circulation. 2010; 121: O671-I458 Review of Systems Past Medical History:  Diagnosis Date  . Allergy   . Anxiety    claustraphobia  . BNC (bladder neck contracture)   . Cancer [facility])    bladder cancer  . Cervical spondylosis   . Claustrophobia   . Complication of anesthesia    claustraphobia  . COPD (chronic obstructive pulmonary disease) ([place])   . DJD (degenerative joint disease)    cervical  . Dyspnea   . Dysuria OCCASIONAL  . GERD (gastroesophageal reflux disease)    takes prilosec intermittently  . Hiatal hernia   . History of BPH   . Hx of bladder cancer   . Hypertension   . Impaired fasting glucose   . Status post dilation of esophageal narrowing   . Urinary hesitancy     Past Surgical History:  Procedure Laterality Date  . CARDIOVASCULAR STRESS TEST  11-08-2009  DR ROTHBART   LOW RISK MYOVIEW/ NO ISCHEMIA/ LVEF 64%  . CATARACT EXTRACTION W/ INTRAOCULAR LENS  IMPLANT, BILATERAL    . CHOLECYSTECTOMY  1984   OPEN  . collapsed lung  1965  . CYSTOSCOPY W/ RETROGRADES Bilateral 01/14/2017   Procedure: CYSTOSCOPY WITH RETROGRADE PYELOGRAM;  Surgeon: Franchot Gallo, MD;  Location: [facility];  Service: Urology;  Laterality: Bilateral;  .  CYSTOSCOPY WITH BIOPSY N/A 11/17/2012   Procedure: CYSTOSCOPY WITH BIOPSY;  Surgeon: Franchot Gallo, MD;  Location: [facility];  Service: Urology;  Laterality: N/A;  . CYSTOSCOPY WITH BIOPSY Bilateral 01/14/2017   Procedure: CYSTOSCOPY WITH BIOPSY;  Surgeon: Franchot Gallo, MD;  Location: [facility];  Service: Urology;  Laterality: Bilateral;  45 MINS [phone] MEDICARE-[id number] D1 [id number]  FYI: PATIENT WANTS IT NOTED THAT HE IS CLAUSTROPHOBIC!  Marland Kitchen LAPAROSCOPIC REPAIR LARGE HIATAL HERNIA W/ MESH AND NISSEN FUNDOPLICATION  [phone]  . PROSTATE BIOPSY N/A 11/17/2012   Procedure: BIOPSY  TRANSRECTAL ULTRASONIC PROSTATE (TUBP);  Surgeon: Franchot Gallo, MD;  Location: [facility];  Service: Urology;  Laterality: N/A;  45 MINS 1. TRANSRECTAL ULTRASOUND OF PROSTATE WITH BIOPSY -will need ultrasound tech 2.CYSTOSCOPY, BLADDER BIOPSY - need resectoscope- not Gyrus [id number] Medicare -[id number] BCBS - [id number]  . REPOSITION OF LENS Right 03/31/2014   Procedure: REPOSITIONING OF INTRAOCULAR LENS IMPLANT RIGHT EYE;  Surgeon: Tonny Branch, MD;  Location: [facility];  Service: Ophthalmology;  Laterality: Right;  . RETINAL DETACHMENT SURGERY  1988  . TRANSURETHRAL RESECTION OF BLADDER NECK  05/01/2012   Procedure: TRANSURETHRAL RESECTION OF BLADDER NECK;  Surgeon: Franchot Gallo, MD;  Location[facility];  Service: Urology;  Laterality: N/A;  [place]   . TRANSURETHRAL RESECTION OF BLADDER TUMOR  01/06/2012   Procedure: TRANSURETHRAL RESECTION OF BLADDER TUMOR (TURBT);  Surgeon: Franchot Gallo, MD;  Location: [facility];  Service: Urology;  Laterality: N/A;  . TRANSURETHRAL RESECTION OF PROSTATE    . TRANSURETHRAL RESECTION OF PROSTATE      Allergies  Allergen Reactions  . Trazodone And Nefazodone Other (See Comments)    dizziness  . Wellbutrin [Bupropion] Nausea And Vomiting    Nausea dizziness    Current Outpatient Prescriptions on File Prior to Visit  Medication Sig Dispense Refill  . albuterol (PROVENTIL HFA;VENTOLIN HFA) 108 (90 Base) MCG/ACT inhaler Inhale 2 puffs into the lungs every 6 (six) hours as needed for wheezing. 1 Inhaler 2  . ALPRAZolam (XANAX) 0.5 MG tablet TAKE 1/2 TO 1 TABLET 4 TIMES DAILY AS NEEDED. (Patient taking differently: Take 0.25-0.5 mg by mouth 4 (four) times daily as needed for anxiety. TAKE 1/2 TO 1 TABLET 4 TIMES DAILY AS NEEDED.) 70 tablet 5  . Famotidine (PEPCID PO) Take 1 tablet by mouth daily as needed. INDIGESTION    . feeding supplement (BOOST HIGH PROTEIN) LIQD  Take 1 Container by mouth daily.    . metoprolol succinate (TOPROL-XL) 25 MG 24 hr tablet Take 1/2 tablet each day. (Patient taking differently: Take 12.5 mg by mouth daily. Take 1/2 tablet each day.) 30 tablet 6  . tamsulosin (FLOMAX) 0.4 MG CAPS capsule Take 2 capsules (0.8 mg total) by mouth at bedtime. 60 capsule 12  . traMADol (ULTRAM) 50 MG tablet TAKE 1 TABLET BY MOUTH 4 TIMES DAILY AS NEEDED. (Patient taking differently: Take 50 mg by mouth 4 (four) times daily. ) 120 tablet 5   No current facility-administered medications on file prior to visit.         Objective:   Physical Exam Blood pressure (!) 150/80, pulse 96, temperature 98.1 F (36.7 C), height 5\' 9"  (1.753 m), weight 161 lb 12.8 oz (73.4 kg). Alert and oriented. Skin warm and dry. Oral mucosa is moist.   . Sclera anicteric, conjunctivae is pink. Thyroid not enlarged. No cervical lymphadenopathy. Lungs clear. Heart regular rate and rhythm.  Abdomen is soft. Bowel  sounds are positive. No hepatomegaly. No abdominal masses felt. No tenderness         Assessment & Plan:  Guaiac positive stool.  Colonoscopy. The risks of bleeding, perforation and infection were reviewed with patient.

## 2017-02-11 NOTE — Patient Instructions (Signed)
The risks of bleeding, perforation and infection were reviewed with patient.  

## 2017-02-11 NOTE — Telephone Encounter (Signed)
Patient needs trilyte 

## 2017-02-18 ENCOUNTER — Ambulatory Visit (INDEPENDENT_AMBULATORY_CARE_PROVIDER_SITE_OTHER): Payer: Medicare Other | Admitting: Urology

## 2017-02-18 DIAGNOSIS — N401 Enlarged prostate with lower urinary tract symptoms: Secondary | ICD-10-CM

## 2017-02-18 DIAGNOSIS — C678 Malignant neoplasm of overlapping sites of bladder: Secondary | ICD-10-CM

## 2017-02-19 ENCOUNTER — Ambulatory Visit: Payer: Medicare Other | Admitting: Family Medicine

## 2017-02-20 ENCOUNTER — Encounter (HOSPITAL_COMMUNITY): Payer: Self-pay

## 2017-02-20 ENCOUNTER — Encounter (HOSPITAL_COMMUNITY)
Admission: RE | Disposition: A | Discharge status: Home / Self Care | Payer: Self-pay | Source: Ambulatory Visit | Attending: Internal Medicine

## 2017-02-20 ENCOUNTER — Ambulatory Visit (HOSPITAL_COMMUNITY)
Admission: RE | Admit: 2017-02-20 | Discharge: 2017-02-20 | Disposition: A | Discharge status: Home / Self Care | Payer: Medicare Other | Source: Ambulatory Visit | Attending: Internal Medicine | Admitting: Internal Medicine

## 2017-02-20 DIAGNOSIS — K648 Other hemorrhoids: Secondary | ICD-10-CM | POA: Insufficient documentation

## 2017-02-20 DIAGNOSIS — F419 Anxiety disorder, unspecified: Secondary | ICD-10-CM | POA: Diagnosis not present

## 2017-02-20 DIAGNOSIS — Z8551 Personal history of malignant neoplasm of bladder: Secondary | ICD-10-CM | POA: Insufficient documentation

## 2017-02-20 DIAGNOSIS — I10 Essential (primary) hypertension: Secondary | ICD-10-CM | POA: Insufficient documentation

## 2017-02-20 DIAGNOSIS — J449 Chronic obstructive pulmonary disease, unspecified: Secondary | ICD-10-CM | POA: Diagnosis not present

## 2017-02-20 DIAGNOSIS — Z87891 Personal history of nicotine dependence: Secondary | ICD-10-CM | POA: Diagnosis not present

## 2017-02-20 DIAGNOSIS — K219 Gastro-esophageal reflux disease without esophagitis: Secondary | ICD-10-CM | POA: Insufficient documentation

## 2017-02-20 DIAGNOSIS — D120 Benign neoplasm of cecum: Secondary | ICD-10-CM | POA: Insufficient documentation

## 2017-02-20 DIAGNOSIS — R195 Other fecal abnormalities: Secondary | ICD-10-CM | POA: Diagnosis not present

## 2017-02-20 DIAGNOSIS — D123 Benign neoplasm of transverse colon: Secondary | ICD-10-CM | POA: Insufficient documentation

## 2017-02-20 DIAGNOSIS — Z79899 Other long term (current) drug therapy: Secondary | ICD-10-CM | POA: Diagnosis not present

## 2017-02-20 DIAGNOSIS — Z800 Family history of malignant neoplasm of digestive organs: Secondary | ICD-10-CM | POA: Diagnosis not present

## 2017-02-20 DIAGNOSIS — K5730 Diverticulosis of large intestine without perforation or abscess without bleeding: Secondary | ICD-10-CM

## 2017-02-20 HISTORY — PX: COLONOSCOPY: SHX5424

## 2017-02-20 SURGERY — COLONOSCOPY
Anesthesia: Moderate Sedation

## 2017-02-20 MED ORDER — SODIUM CHLORIDE 0.9 % IV SOLN
INTRAVENOUS | Status: DC
  Administered 2017-02-20: 20 mL/h via INTRAVENOUS

## 2017-02-20 MED ORDER — MEPERIDINE HCL 50 MG/ML IJ SOLN
INTRAMUSCULAR | Status: DC | PRN
  Administered 2017-02-20 (×2): 25 mg via INTRAVENOUS

## 2017-02-20 MED ORDER — MEPERIDINE HCL 50 MG/ML IJ SOLN
INTRAMUSCULAR | Status: AC
  Filled 2017-02-20: qty 1

## 2017-02-20 MED ORDER — MIDAZOLAM HCL 5 MG/5ML IJ SOLN
INTRAMUSCULAR | Status: DC | PRN
  Administered 2017-02-20: 1 mg via INTRAVENOUS
  Administered 2017-02-20: 2 mg via INTRAVENOUS
  Administered 2017-02-20: 1 mg via INTRAVENOUS

## 2017-02-20 MED ORDER — MIDAZOLAM HCL 5 MG/5ML IJ SOLN
INTRAMUSCULAR | Status: AC
  Filled 2017-02-20: qty 10

## 2017-02-20 MED ORDER — SIMETHICONE 40 MG/0.6ML PO SUSP
ORAL | Status: DC | PRN
  Administered 2017-02-20: 100 mL

## 2017-02-20 NOTE — H&P (Signed)
Randy [name] is an 83 y.o. male.   Chief Complaint: Patient is here for colonoscopy. HPI: Patient is 59 old Caucasian male who was having lower abdominal pain. He therefore had abdominopelvic CT and was unremarkable other than colonic diverticulosis and right inguinal hernia. He he was noted to have heme-positive stool. Lower abdominal pain is gone. He is prone to constipation and uses stool Cipro on as-needed basis. He denies weight loss melena or rectal bleeding. Last colonoscopy was in September 2006 with removal of single small cecal polyp and it turned out be tubular adenoma. Family history significant for CRC and brother who was 6 at the time of diagnosis.  Past Medical History:  Diagnosis Date      . Anxiety    claustraphobia  . BNC (bladder neck contracture)   . Cancer [facility])    bladder cancer  . Cervical spondylosis   . Claustrophobia   . Complication of anesthesia    claustraphobia  . COPD (chronic obstructive pulmonary disease) ([place])   . DJD (degenerative joint disease)    cervical        OCCASIONAL  . GERD (gastroesophageal reflux disease)    takes prilosec intermittently  . Hiatal hernia   . History of BPH   . Hx of bladder cancer   . Hypertension   . Impaired fasting glucose   . Status post dilation of esophageal narrowing   . Urinary hesitancy     Past Surgical History:  Procedure Laterality Date  . CARDIOVASCULAR STRESS TEST  11-08-2009  DR ROTHBART   LOW RISK MYOVIEW/ NO ISCHEMIA/ LVEF 64%  . CATARACT EXTRACTION W/ INTRAOCULAR LENS  IMPLANT, BILATERAL    . CHOLECYSTECTOMY  1984   OPEN  . collapsed lung  1965  . CYSTOSCOPY W/ RETROGRADES Bilateral 01/14/2017   Procedure: CYSTOSCOPY WITH RETROGRADE PYELOGRAM;  Surgeon: Franchot Gallo, MD;  Location: [facility];  Service: Urology;  Laterality: Bilateral;  . CYSTOSCOPY WITH BIOPSY N/A 11/17/2012   Procedure: CYSTOSCOPY WITH BIOPSY;  Surgeon: Franchot Gallo, MD;  Location: [facility];  Service: Urology;   Laterality: N/A;  . CYSTOSCOPY WITH BIOPSY Bilateral 01/14/2017   Procedure: CYSTOSCOPY WITH BIOPSY;  Surgeon: Franchot Gallo, MD;  Location: [facility];  Service: Urology;  Laterality: Bilateral;  45 MINS [phone] MEDICARE-[id number] D1 [id number]  FYI: PATIENT WANTS IT NOTED THAT HE IS CLAUSTROPHOBIC!  Marland Kitchen LAPAROSCOPIC REPAIR LARGE HIATAL HERNIA W/ MESH AND NISSEN FUNDOPLICATION  [phone]  . PROSTATE BIOPSY N/A 11/17/2012   Procedure: BIOPSY TRANSRECTAL ULTRASONIC PROSTATE (TUBP);  Surgeon: Franchot Gallo, MD;  Location: [facility];  Service: Urology;  Laterality: N/A;  45 MINS 1. TRANSRECTAL ULTRASOUND OF PROSTATE WITH BIOPSY -will need ultrasound tech 2.CYSTOSCOPY, BLADDER BIOPSY - need resectoscope- not Gyrus [id number] Medicare -[id number] BCBS - [id number]  . REPOSITION OF LENS Right 03/31/2014   Procedure: REPOSITIONING OF INTRAOCULAR LENS IMPLANT RIGHT EYE;  Surgeon: Tonny Branch, MD;  Location: [facility];  Service: Ophthalmology;  Laterality: Right;  . RETINAL DETACHMENT SURGERY  1988  . TRANSURETHRAL RESECTION OF BLADDER NECK  05/01/2012   Procedure: TRANSURETHRAL RESECTION OF BLADDER NECK;  Surgeon: Franchot Gallo, MD;  Location: [facility];  Service: Urology;  Laterality: N/A;  [place]   . TRANSURETHRAL RESECTION OF BLADDER TUMOR  01/06/2012   Procedure: TRANSURETHRAL RESECTION OF BLADDER TUMOR (TURBT);  Surgeon: Franchot Gallo, MD;  Location: [facility];  Service: Urology;  Laterality: N/A;  . TRANSURETHRAL RESECTION OF PROSTATE    .  TRANSURETHRAL RESECTION OF PROSTATE      Family History  Problem Relation Age of Onset  . Stroke Mother   . Kidney failure Father   . Heart disease Brother   . Colon cancer Neg Hx   . Esophageal cancer Neg Hx   . Rectal cancer Neg Hx   . Stomach cancer Neg Hx    Social History:  reports that he quit smoking about 30 years ago. His smoking use included  Cigarettes. He has a 40.00 pack-year smoking history. He has never used smokeless tobacco. He reports that he does not drink alcohol or use drugs.  Allergies:  Allergies  Allergen Reactions  . Trazodone And Nefazodone Other (See Comments)    dizziness  . Wellbutrin [Bupropion] Nausea And Vomiting    Nausea dizziness    Medications Prior to Admission  Medication Sig Dispense Refill  . acetaminophen (TYLENOL) 500 MG tablet Take 500 mg by mouth every 6 (six) hours as needed.    Marland Kitchen albuterol (PROVENTIL HFA;VENTOLIN HFA) 108 (90 Base) MCG/ACT inhaler Inhale 2 puffs into the lungs every 6 (six) hours as needed for wheezing. 1 Inhaler 2  . ALPRAZolam (XANAX) 0.5 MG tablet TAKE 1/2 TO 1 TABLET 4 TIMES DAILY AS NEEDED. (Patient taking differently: Take 0.25-0.5 mg by mouth 4 (four) times daily as needed for anxiety. TAKE 1/2 TO 1 TABLET 4 TIMES DAILY AS NEEDED.) 70 tablet 5  . famotidine (PEPCID) 20 MG tablet Take 1 tablet by mouth daily as needed. INDIGESTION    . feeding supplement (BOOST HIGH PROTEIN) LIQD Take 1 Container by mouth daily.    . metoprolol succinate (TOPROL-XL) 25 MG 24 hr tablet Take 1/2 tablet each day. (Patient taking differently: Take 12.5 mg by mouth daily. Take 1/2 tablet each day.) 30 tablet 6  . tamsulosin (FLOMAX) 0.4 MG CAPS capsule Take 2 capsules (0.8 mg total) by mouth at bedtime. 60 capsule 12  . traMADol (ULTRAM) 50 MG tablet TAKE 1 TABLET BY MOUTH 4 TIMES DAILY AS NEEDED. (Patient taking differently: Take 50 mg by mouth 4 (four) times daily. ) 120 tablet 5    No results found for this or any previous visit (from the past 48 hour(s)). No results found.  ROS  Blood pressure (!) 174/90, pulse 99, temperature 97.8 F (36.6 C), temperature source Oral, resp. rate 17, height 5\' 9"  (1.753 m), weight 161 lb (73 kg), SpO2 100 %. Physical Exam  Constitutional: He appears well-developed and well-nourished.  HENT:  Mouth/Throat: Oropharynx is clear and moist.  Eyes:  Conjunctivae are normal. No scleral icterus.  Neck: No thyromegaly present.  Cardiovascular: Normal rate, regular rhythm and normal heart sounds.   No murmur heard. Respiratory: Effort normal and breath sounds normal.  GI: Soft. He exhibits no distension and no mass. There is no tenderness.  Small right inguinal hernia. It is completely reducible.  Musculoskeletal: He exhibits no edema.  Lymphadenopathy:    He has no cervical adenopathy.  Neurological: He is alert.  Skin: Skin is warm and dry.     Assessment/Plan Heme positive stool. History of CRC and brother at late onset.  Diagnostic colonoscopy.  Hildred Laser, MD 02/20/2017, 10:38 AM

## 2017-02-20 NOTE — Op Note (Signed)
[facility] Patient Name: Randy [name] Procedure Date: 02/20/2017 10:15 AM MRN: [id number] Date of Birth: 1928-11-09 Attending MD: Hildred Laser , MD CSN: [id number] Age: 83 Admit Type: Outpatient Procedure:                Colonoscopy Indications:              Heme positive stool, Family history of colon cancer                            in a first-degree relative Providers:                Hildred Laser, MD, Rosina Lowenstein, RN, Aram Candela Referring MD:             Elayne Snare. Wolfgang Phoenix, MD Medicines:                Meperidine 50 mg IV, Midazolam 4 mg IV Complications:            No immediate complications. Estimated Blood Loss:     Estimated blood loss was minimal. Procedure:                Pre-Anesthesia Assessment:                           - Prior to the procedure, a History and Physical                            was performed, and patient medications and                            allergies were reviewed. The patient's tolerance of                            previous anesthesia was also reviewed. The risks                            and benefits of the procedure and the sedation                            options and risks were discussed with the patient.                            All questions were answered, and informed consent                            was obtained. Prior Anticoagulants: The patient has                            taken no previous anticoagulant or antiplatelet                            agents. ASA Grade Assessment: II - A patient with                            mild systemic disease. After reviewing the risks  and benefits, the patient was deemed in                            satisfactory condition to undergo the procedure.                           After obtaining informed consent, the colonoscope                            was passed under direct vision. Throughout the                            procedure, the patient's blood  pressure, pulse, and                            oxygen saturations were monitored continuously. The                            EC-[id number] ([id number]) scope was introduced through                            the anus and advanced to the the cecum, identified                            by appendiceal orifice and ileocecal valve. The                            colonoscopy was performed without difficulty. The                            patient tolerated the procedure well. The quality                            of the bowel preparation was adequate. The                            ileocecal valve, appendiceal orifice, and rectum                            were photographed. Scope In: 10:49:40 AM Scope Out: 11:14:41 AM Scope Withdrawal Time: 0 hours 16 minutes 45 seconds  Total Procedure Duration: 0 hours 25 minutes 1 second  Findings:      The perianal and digital rectal examinations were normal.      Multiple small and large-mouthed diverticula were found in the entire       colon.      Four sessile and semi-pedunculated polyps were found in the hepatic       flexure and cecum. The polyps were 4 to 6 mm in size. These polyps were       removed with a cold snare. Resection and retrieval were complete. The       pathology specimen was placed into Bottle Number 1.      Two sessile polyps were found in the hepatic flexure. The polyps were       small in size. These were biopsied  with a cold forceps for histology.       The pathology specimen was placed into Bottle Number 1.      Internal hemorrhoids were found during retroflexion. The hemorrhoids       were small. Impression:               - Diverticulosis in the entire examined colon.                           - Four 4 to 6 mm polyps at the hepatic flexure and                            in the cecum, removed with a cold snare. Resected                            and retrieved.                           - Two small polyps at the hepatic flexure.  Biopsied.                           - Internal hemorrhoids. Moderate Sedation:      Moderate (conscious) sedation was administered by the endoscopy nurse       and supervised by the endoscopist. The following parameters were       monitored: oxygen saturation, heart rate, blood pressure, CO2       capnography and response to care. Total physician intraservice time was       31 minutes. Recommendation:           - Patient has a contact number available for                            emergencies. The signs and symptoms of potential                            delayed complications were discussed with the                            patient. Return to normal activities tomorrow.                            Written discharge instructions were provided to the                            patient.                           - High fiber diet today.                           - Continue present medications.                           - Await pathology results.                           -  No recommendation at this time regarding repeat                            colonoscopy due to age. Procedure Code(s):        --- Professional ---                           [id number], Colonoscopy, flexible; with removal of                            tumor(s), polyp(s), or other lesion(s) by snare                            technique                           45380, 59, Colonoscopy, flexible; with biopsy,                            single or multiple                           99152, Moderate sedation services provided by the                            same physician or other qualified health care                            professional performing the diagnostic or                            therapeutic service that the sedation supports,                            requiring the presence of an independent trained                            observer to assist in the monitoring of the                            patient's  level of consciousness and physiological                            status; initial 15 minutes of intraservice time,                            patient age 57 years or older                           [phone], Moderate sedation services; each additional                            15 minutes intraservice time Diagnosis Code(s):        --- Professional ---  K64.8, Other hemorrhoids                           D12.0, Benign neoplasm of cecum                           D12.3, Benign neoplasm of transverse colon (hepatic                            flexure or splenic flexure)                           R19.5, Other fecal abnormalities                           Z80.0, Family history of malignant neoplasm of                            digestive organs                           K57.30, Diverticulosis of large intestine without                            perforation or abscess without bleeding CPT copyright 2016 American Medical Association. All rights reserved. The codes documented in this report are preliminary and upon coder review may  be revised to meet current compliance requirements. Hildred Laser, MD Hildred Laser, MD 02/20/2017 11:23:58 AM This report has been signed electronically. Number of Addenda: 0

## 2017-02-20 NOTE — Discharge Instructions (Addendum)
High-Fiber Diet Fiber, also called dietary fiber, is a type of carbohydrate found in fruits, vegetables, whole grains, and beans. A high-fiber diet can have many health benefits. Your health care provider may recommend a high-fiber diet to help:  Prevent constipation. Fiber can make your bowel movements more regular.  Lower your cholesterol.  Relieve hemorrhoids, uncomplicated diverticulosis, or irritable bowel syndrome.  Prevent overeating as part of a weight-loss plan.  Prevent heart disease, type 2 diabetes, and certain cancers.  What is my plan? The recommended daily intake of fiber includes:  38 grams for men under age 67.  31 grams for men over age 71.  61 grams for women under age 60.  32 grams for women over age 13.  You can get the recommended daily intake of dietary fiber by eating a variety of fruits, vegetables, grains, and beans. Your health care provider may also recommend a fiber supplement if it is not possible to get enough fiber through your diet. What do I need to know about a high-fiber diet?  Fiber supplements have not been widely studied for their effectiveness, so it is better to get fiber through food sources.  Always check the fiber content on thenutrition facts label of any prepackaged food. Look for foods that contain at least 5 grams of fiber per serving.  Ask your dietitian if you have questions about specific foods that are related to your condition, especially if those foods are not listed in the following section.  Increase your daily fiber consumption gradually. Increasing your intake of dietary fiber too quickly may cause bloating, cramping, or gas.  Drink plenty of water. Water helps you to digest fiber. What foods can I eat? Grains Whole-grain breads. Multigrain cereal. Oats and oatmeal. Brown rice. Barley. Bulgur wheat. [place]. Bran muffins. Popcorn. Rye wafer crackers. Vegetables Sweet potatoes. Spinach. Kale. Artichokes. Cabbage.  Broccoli. Green peas. Carrots. Squash. Fruits Berries. Pears. Apples. Oranges. Avocados. Prunes and raisins. Dried figs. Meats and Other Protein Sources [place], kidney, pinto, and soy beans. Split peas. Lentils. Nuts and seeds. Dairy Fiber-fortified yogurt. Beverages Fiber-fortified soy milk. Fiber-fortified orange juice. Other Fiber bars. The items listed above may not be a complete list of recommended foods or beverages. Contact your dietitian for more options. What foods are not recommended? Grains White bread. Pasta made with refined flour. White rice. Vegetables Fried potatoes. Canned vegetables. Well-cooked vegetables. Fruits Fruit juice. Cooked, strained fruit. Meats and Other Protein Sources Fatty cuts of meat. Fried Sales executive or fried fish. Dairy Milk. Yogurt. Cream cheese. Sour cream. Beverages Soft drinks. Other Cakes and pastries. Butter and oils. The items listed above may not be a complete list of foods and beverages to avoid. Contact your dietitian for more information. What are some tips for including high-fiber foods in my diet?  Eat a wide variety of high-fiber foods.  Make sure that half of all grains consumed each day are whole grains.  Replace breads and cereals made from refined flour or white flour with whole-grain breads and cereals.  Replace white rice with brown rice, bulgur wheat, or millet.  Start the day with a breakfast that is high in fiber, such as a cereal that contains at least 5 grams of fiber per serving.  Use beans in place of meat in soups, salads, or pasta.  Eat high-fiber snacks, such as berries, raw vegetables, nuts, or popcorn. This information is not intended to replace advice given to you by your health care provider. Make sure you discuss any  questions you have with your health care provider. Document Released: 06/17/2005 Document Revised: 11/23/2015 Document Reviewed: 11/30/2013 Elsevier Interactive Patient Education  2017  [place]. Colonoscopy, Adult, Care After This sheet gives you information about how to care for yourself after your procedure. Your health care provider may also give you more specific instructions. If you have problems or questions, contact your health care provider. What can I expect after the procedure? After the procedure, it is common to have:  A small amount of blood in your stool for 24 hours after the procedure.  Some gas.  Mild abdominal cramping or bloating.  Follow these instructions at home: General instructions   For the first 24 hours after the procedure: ? Do not drive or use machinery. ? Do not sign important documents. ? Do not drink alcohol. ? Do your regular daily activities at a slower pace than normal. ? Eat soft, easy-to-digest foods. ? Rest often.  Take over-the-counter or prescription medicines only as told by your health care provider.  It is up to you to get the results of your procedure. Ask your health care provider, or the department performing the procedure, when your results will be ready. Relieving cramping and bloating  Try walking around when you have cramps or feel bloated.  Apply heat to your abdomen as told by your health care provider. Use a heat source that your health care provider recommends, such as a moist heat pack or a heating pad. ? Place a towel between your skin and the heat source. ? Leave the heat on for 20-30 minutes. ? Remove the heat if your skin turns bright red. This is especially important if you are unable to feel pain, heat, or cold. You may have a greater risk of getting burned. Eating and drinking  Drink enough fluid to keep your urine clear or pale yellow.  Resume your normal diet as instructed by your health care provider. Avoid heavy or fried foods that are hard to digest.  Avoid drinking alcohol for as long as instructed by your health care provider. Contact a health care provider if:  You have blood in  your stool 2-3 days after the procedure. Get help right away if:  You have more than a small spotting of blood in your stool.  You pass large blood clots in your stool.  Your abdomen is swollen.  You have nausea or vomiting.  You have a fever.  You have increasing abdominal pain that is not relieved with medicine. This information is not intended to replace advice given to you by your health care provider. Make sure you discuss any questions you have with your health care provider. Document Released: 01/30/2004 Document Revised: 03/11/2016 Document Reviewed: 08/29/2015 Elsevier Interactive Patient Education  2018 [place] usual medications and high fiber diet. No driving for 24 hours. Physician will call with biopsy results.

## 2017-02-24 ENCOUNTER — Encounter (HOSPITAL_COMMUNITY): Payer: Self-pay | Admitting: Internal Medicine

## 2017-03-04 ENCOUNTER — Ambulatory Visit (INDEPENDENT_AMBULATORY_CARE_PROVIDER_SITE_OTHER): Payer: Medicare Other | Admitting: Urology

## 2017-03-04 DIAGNOSIS — C678 Malignant neoplasm of overlapping sites of bladder: Secondary | ICD-10-CM | POA: Diagnosis not present

## 2017-03-11 ENCOUNTER — Encounter: Payer: Self-pay | Admitting: Family Medicine

## 2017-03-11 ENCOUNTER — Ambulatory Visit (INDEPENDENT_AMBULATORY_CARE_PROVIDER_SITE_OTHER): Payer: Medicare Other | Admitting: Family Medicine

## 2017-03-11 ENCOUNTER — Ambulatory Visit (INDEPENDENT_AMBULATORY_CARE_PROVIDER_SITE_OTHER): Payer: Medicare Other | Admitting: Urology

## 2017-03-11 VITALS — BP 138/90 | Ht 69.0 in | Wt 162.0 lb

## 2017-03-11 DIAGNOSIS — C678 Malignant neoplasm of overlapping sites of bladder: Secondary | ICD-10-CM

## 2017-03-11 DIAGNOSIS — J431 Panlobular emphysema: Secondary | ICD-10-CM

## 2017-03-11 DIAGNOSIS — I10 Essential (primary) hypertension: Secondary | ICD-10-CM | POA: Diagnosis not present

## 2017-03-11 MED ORDER — TRAMADOL HCL 50 MG PO TABS: 50.0000 mg | ORAL_TABLET | Freq: Four times a day (QID) | ORAL | 4 refills | Status: DC

## 2017-03-11 MED ORDER — METOPROLOL SUCCINATE ER 25 MG PO TB24: 12.5000 mg | ORAL_TABLET | Freq: Every day | ORAL | 6 refills | Status: DC

## 2017-03-11 MED ORDER — ALPRAZOLAM 0.5 MG PO TABS: ORAL_TABLET | ORAL | 5 refills | Status: DC

## 2017-03-11 MED ORDER — ALBUTEROL SULFATE HFA 108 (90 BASE) MCG/ACT IN AERS: 2.0000 | INHALATION_SPRAY | Freq: Four times a day (QID) | RESPIRATORY_TRACT | 2 refills | Status: DC | PRN

## 2017-03-11 NOTE — Progress Notes (Signed)
   Subjective:    Patient ID: Randy [name], male    DOB: 1929/02/13, 83 y.o.   MRN: [id number]  Hypertension  This is a chronic problem. Pertinent negatives include no chest pain, headaches or shortness of breath.  He does relate compliance with his medicine. He denies any high blood pressure he feels is under good control denies headaches or chest pain  He does have COPD he does get some short of breath with activity he does uses oxygen faithfully  He does have arthralgias and myalgias intermittently he is use tramadol for years and is helped him other anti-inflammatories have not and allow his him to function well  His anxiety level stable uses low-dose Xanax without it he has a lot of nervousness related to anxiety and COPD Pt states no concerns today.   Wants flu senior vaccine.   Needs refills on xanax, toprol and tramadol.    Review of Systems  Constitutional: Negative for activity change, fatigue and fever.  HENT: Negative for congestion.   Respiratory: Negative for cough and shortness of breath.   Cardiovascular: Negative for chest pain and leg swelling.  Genitourinary: Negative for frequency.  Neurological: Negative for headaches.       Objective:   Physical Exam  Constitutional: He appears well-nourished. No distress.  HENT:  Head: Normocephalic and atraumatic.  Right Ear: External ear normal.  Left Ear: External ear normal.  Eyes: Right eye exhibits no discharge. Left eye exhibits no discharge.  Cardiovascular: Normal rate, regular rhythm and normal heart sounds.   No murmur heard. Pulmonary/Chest: Effort normal and breath sounds normal. No respiratory distress. He has no wheezes. He has no rales.  Abdominal: Soft. He exhibits no distension. There is no tenderness.  Musculoskeletal: He exhibits no edema.  Lymphadenopathy:    He has no cervical adenopathy.  Neurological: He is alert.  Psychiatric: His behavior is normal.  Vitals reviewed.           Assessment & Plan:  Chronic arthralgias and myalgias he is done well for years on tramadol. He takes 4 times a day that would be fine for him to continue  He follows with urology for those issues on a regular basis  His COPD is stable on oxygen tolerating well  Uses Xanax for underlying anxiety does not use a large amount denies a causing drowsiness  Senior flu shot at [place] in 4-5 months

## 2017-03-18 ENCOUNTER — Ambulatory Visit (INDEPENDENT_AMBULATORY_CARE_PROVIDER_SITE_OTHER): Payer: Medicare Other | Admitting: Urology

## 2017-03-18 DIAGNOSIS — C678 Malignant neoplasm of overlapping sites of bladder: Secondary | ICD-10-CM

## 2017-03-25 ENCOUNTER — Ambulatory Visit (INDEPENDENT_AMBULATORY_CARE_PROVIDER_SITE_OTHER): Payer: Medicare Other | Admitting: Urology

## 2017-03-25 DIAGNOSIS — C678 Malignant neoplasm of overlapping sites of bladder: Secondary | ICD-10-CM | POA: Diagnosis not present

## 2017-04-01 ENCOUNTER — Ambulatory Visit (INDEPENDENT_AMBULATORY_CARE_PROVIDER_SITE_OTHER): Payer: Medicare Other | Admitting: Urology

## 2017-04-01 DIAGNOSIS — C678 Malignant neoplasm of overlapping sites of bladder: Secondary | ICD-10-CM

## 2017-04-08 ENCOUNTER — Ambulatory Visit (INDEPENDENT_AMBULATORY_CARE_PROVIDER_SITE_OTHER): Payer: Medicare Other | Admitting: Urology

## 2017-04-08 DIAGNOSIS — C678 Malignant neoplasm of overlapping sites of bladder: Secondary | ICD-10-CM | POA: Diagnosis not present

## 2017-04-09 ENCOUNTER — Other Ambulatory Visit: Payer: Self-pay | Admitting: Family Medicine

## 2017-04-09 NOTE — Telephone Encounter (Signed)
Last seen 03/11/17

## 2017-04-09 NOTE — Telephone Encounter (Signed)
This +5 refills 

## 2017-06-10 ENCOUNTER — Ambulatory Visit: Payer: Medicare Other | Admitting: Urology

## 2017-06-26 ENCOUNTER — Ambulatory Visit (INDEPENDENT_AMBULATORY_CARE_PROVIDER_SITE_OTHER): Payer: Medicare Other | Admitting: Family Medicine

## 2017-06-26 ENCOUNTER — Encounter: Payer: Self-pay | Admitting: Family Medicine

## 2017-06-26 VITALS — BP 146/88 | Ht 69.0 in | Wt 164.8 lb

## 2017-06-26 DIAGNOSIS — R1030 Lower abdominal pain, unspecified: Secondary | ICD-10-CM | POA: Diagnosis not present

## 2017-06-26 DIAGNOSIS — K5792 Diverticulitis of intestine, part unspecified, without perforation or abscess without bleeding: Secondary | ICD-10-CM | POA: Diagnosis not present

## 2017-06-26 MED ORDER — CIPROFLOXACIN HCL 500 MG PO TABS: 500.0000 mg | ORAL_TABLET | Freq: Two times a day (BID) | ORAL | 0 refills | Status: DC

## 2017-06-26 MED ORDER — METRONIDAZOLE 500 MG PO TABS: 500.0000 mg | ORAL_TABLET | Freq: Three times a day (TID) | ORAL | 0 refills | Status: DC

## 2017-06-26 NOTE — Progress Notes (Signed)
   Subjective:    Patient ID: Randy [name], male    DOB: 07-07-1928, 83 y.o.   MRN: [id number]  HPI Patient arrives with c/o lower abdominal pain for 2-3 weeks. Patient believes it is something to do with stomach. Relates lower abdominal pain over the past couple weeks worse over the weekend a little bit better at the moment denies nausea fever vomiting denies rectal bleeding states energy level subpar Review of Systems  Constitutional: Negative for activity change.  Gastrointestinal: Positive for abdominal pain. Negative for vomiting.  Neurological: Negative for weakness.  Psychiatric/Behavioral: Negative for confusion.       Objective:   Physical Exam  Constitutional: He appears well-nourished. No distress.  Cardiovascular: Normal rate, regular rhythm and normal heart sounds.  No murmur heard. Pulmonary/Chest: Effort normal and breath sounds normal. No respiratory distress.  Abdominal: There is tenderness. There is no rebound and no guarding.  Musculoskeletal: He exhibits no edema.  Lymphadenopathy:    He has no cervical adenopathy.  Neurological: He is alert.  Psychiatric: His behavior is normal.  Vitals reviewed.  Abdomen there is some mild tenderness in left lower quadrant no guarding or rebound   Patient is not in any type of distress currently    Assessment & Plan:  Abdominal pain recommend lab work.  I have talked with patient about possibility of a CAT scan patient would prefer to hold off on this.  We will go ahead and treat with antibiotics to cover for the possibility of diverticulitis I recommend that I recheck him again in 1 week's time patient did have a CAT scan in the summer 2018 that did not show any tumors or growths patient was instructed to follow-up sooner if any problems

## 2017-06-27 LAB — BASIC METABOLIC PANEL
BUN/Creatinine Ratio: 13 (ref 10–24)
BUN: 14 mg/dL (ref 8–27)
CO2: 31 mmol/L — AB (ref 20–29)
CREATININE: 1.1 mg/dL (ref 0.76–1.27)
Calcium: 9.5 mg/dL (ref 8.6–10.2)
Chloride: 93 mmol/L — ABNORMAL LOW (ref 96–106)
GFR calc Af Amer: 70 mL/min/{1.73_m2} (ref >59)
GFR calc non Af Amer: 60 mL/min/{1.73_m2} (ref >59)
GLUCOSE: 120 mg/dL — AB (ref 65–99)
Potassium: 5 mmol/L (ref 3.5–5.2)
SODIUM: 138 mmol/L (ref 134–144)

## 2017-06-27 LAB — CBC WITH DIFFERENTIAL/PLATELET
BASOS ABS: 0 10*3/uL (ref 0.0–0.2)
Basos: 0 %
EOS (ABSOLUTE): 0.1 10*3/uL (ref 0.0–0.4)
Eos: 2 %
Hematocrit: 44.3 % (ref 37.5–51.0)
Hemoglobin: 14.5 g/dL (ref 13.0–17.7)
Immature Grans (Abs): 0 10*3/uL (ref 0.0–0.1)
Immature Granulocytes: 0 %
LYMPHS ABS: 1.2 10*3/uL (ref 0.7–3.1)
Lymphs: 15 %
MCH: 30.4 pg (ref 26.6–33.0)
MCHC: 32.7 g/dL (ref 31.5–35.7)
MCV: 93 fL (ref 79–97)
Monocytes Absolute: 1 10*3/uL — ABNORMAL HIGH (ref 0.1–0.9)
Monocytes: 12 %
Neutrophils Absolute: 5.6 10*3/uL (ref 1.4–7.0)
Neutrophils: 71 %
PLATELETS: 202 10*3/uL (ref 150–379)
RBC: 4.77 x10E6/uL (ref 4.14–5.80)
RDW: 13.8 % (ref 12.3–15.4)
WBC: 7.8 10*3/uL (ref 3.4–10.8)

## 2017-06-27 LAB — HEPATIC FUNCTION PANEL
ALK PHOS: 64 IU/L (ref 39–117)
ALT: 16 IU/L (ref 0–44)
AST: 20 IU/L (ref 0–40)
Albumin: 4.4 g/dL (ref 3.5–4.7)
Bilirubin Total: 0.3 mg/dL (ref 0.0–1.2)
Bilirubin, Direct: 0.08 mg/dL (ref 0.00–0.40)
TOTAL PROTEIN: 7.1 g/dL (ref 6.0–8.5)

## 2017-06-27 LAB — LIPASE: LIPASE: 25 U/L (ref 13–78)

## 2017-07-03 ENCOUNTER — Encounter: Payer: Self-pay | Admitting: Family Medicine

## 2017-07-03 ENCOUNTER — Other Ambulatory Visit: Payer: Self-pay

## 2017-07-03 ENCOUNTER — Ambulatory Visit (INDEPENDENT_AMBULATORY_CARE_PROVIDER_SITE_OTHER): Payer: Medicare Other | Admitting: Family Medicine

## 2017-07-03 VITALS — BP 128/82 | Ht 69.0 in | Wt 164.4 lb

## 2017-07-03 DIAGNOSIS — J449 Chronic obstructive pulmonary disease, unspecified: Secondary | ICD-10-CM | POA: Diagnosis not present

## 2017-07-03 DIAGNOSIS — R0902 Hypoxemia: Secondary | ICD-10-CM | POA: Diagnosis not present

## 2017-07-03 DIAGNOSIS — R1030 Lower abdominal pain, unspecified: Secondary | ICD-10-CM

## 2017-07-03 DIAGNOSIS — F411 Generalized anxiety disorder: Secondary | ICD-10-CM

## 2017-07-03 DIAGNOSIS — K5792 Diverticulitis of intestine, part unspecified, without perforation or abscess without bleeding: Secondary | ICD-10-CM

## 2017-07-03 LAB — IFOBT (OCCULT BLOOD): IMMUNOLOGICAL FECAL OCCULT BLOOD TEST: NEGATIVE

## 2017-07-03 MED ORDER — ALPRAZOLAM 0.5 MG PO TABS: ORAL_TABLET | ORAL | 5 refills | Status: DC

## 2017-07-03 MED ORDER — TAMSULOSIN HCL 0.4 MG PO CAPS: 0.8000 mg | ORAL_CAPSULE | Freq: Every day | ORAL | 12 refills | Status: DC

## 2017-07-03 MED ORDER — TRAMADOL HCL 50 MG PO TABS: ORAL_TABLET | ORAL | 5 refills | Status: DC

## 2017-07-03 NOTE — Progress Notes (Signed)
   Subjective:    Patient ID: Randy [name], male    DOB: 1929/03/21, 84 y.o.   MRN: [id number]  HPI  Patient arrives for a follow up on stomach pain. Patient states the pain is better but still feeling some sick on stomach. He is relating intermittent lower abdominal symptoms but he states they are much better he is feels like it is going away he states his bowels are moving he does have an occasional difficulty swallowing water but no difficulty swallowing food.  He states it does not get stuck on him.  Denies high fever chills sweats  He does have significant anxiety issues and arthralgia issues tramadol taken 4 times a day seems to help this Xanax use 2 or 3 times a day helps he denies being depressed not suicidal.  States medication does not make him feel drugged  He has COPD Face-to-face evaluation was completed today Patient has severe COPD with hypoxia.  It is  medically necessary for this patient to use his oxygen 24 hours a day.  It is also medically necessary for this patient D have oxygen supplied by Medicare.  He does benefit from the oxygen.  Without it he would be grossly limited and his health would be an significant risk Review of Systems  Constitutional: Negative for activity change, fatigue and fever.  Respiratory: Negative for cough and shortness of breath.   Cardiovascular: Negative for chest pain and leg swelling.  Neurological: Negative for headaches.       Objective:   Physical Exam  Constitutional: He appears well-nourished.  Cardiovascular: Normal rate, regular rhythm and normal heart sounds.  No murmur heard. Pulmonary/Chest: Effort normal and breath sounds normal.  Musculoskeletal: He exhibits no edema.  Lymphadenopathy:    He has no cervical adenopathy.  Neurological: He is alert.  Psychiatric: His behavior is normal.  Vitals reviewed.   Abdomen is soft with no guarding rebound or tenderness  Currently I do not feel the patient needs to undergo more lab  work or CAT scans.    Assessment & Plan:  Abdominal symptoms vastly improved recheck patient in 6 weeks I do not feel the patient needs any referral at this point for CAT scan or GI patient was told that if his GI symptoms worsen or return he needs to let us know immediately  Chronic pains and discomfort continue tramadol as prescribed  Chronic anxiety continue Xanax as prescribed  Severe COPD with oxygen dependency continue this per standard guidelines he follows with Dr. Luan Pulling on a regular basis

## 2017-07-08 DIAGNOSIS — H33002 Unspecified retinal detachment with retinal break, left eye: Secondary | ICD-10-CM | POA: Diagnosis not present

## 2017-07-08 DIAGNOSIS — Z9841 Cataract extraction status, right eye: Secondary | ICD-10-CM | POA: Diagnosis not present

## 2017-07-08 DIAGNOSIS — H43391 Other vitreous opacities, right eye: Secondary | ICD-10-CM | POA: Diagnosis not present

## 2017-07-08 DIAGNOSIS — Z961 Presence of intraocular lens: Secondary | ICD-10-CM | POA: Diagnosis not present

## 2017-07-09 DIAGNOSIS — N401 Enlarged prostate with lower urinary tract symptoms: Secondary | ICD-10-CM | POA: Diagnosis not present

## 2017-07-09 DIAGNOSIS — J449 Chronic obstructive pulmonary disease, unspecified: Secondary | ICD-10-CM | POA: Diagnosis not present

## 2017-07-09 DIAGNOSIS — J9611 Chronic respiratory failure with hypoxia: Secondary | ICD-10-CM | POA: Diagnosis not present

## 2017-07-09 DIAGNOSIS — I10 Essential (primary) hypertension: Secondary | ICD-10-CM | POA: Diagnosis not present

## 2017-07-16 ENCOUNTER — Ambulatory Visit: Payer: Medicare Other | Admitting: Family Medicine

## 2017-07-29 ENCOUNTER — Other Ambulatory Visit (HOSPITAL_COMMUNITY)
Admission: RE | Admit: 2017-07-29 | Discharge: 2017-07-29 | Disposition: A | Discharge status: Home / Self Care | Payer: Medicare Other | Source: Ambulatory Visit | Attending: Urology | Admitting: Urology

## 2017-07-29 ENCOUNTER — Ambulatory Visit (INDEPENDENT_AMBULATORY_CARE_PROVIDER_SITE_OTHER): Payer: Medicare Other | Admitting: Urology

## 2017-07-29 DIAGNOSIS — C678 Malignant neoplasm of overlapping sites of bladder: Secondary | ICD-10-CM | POA: Insufficient documentation

## 2017-07-29 DIAGNOSIS — R351 Nocturia: Secondary | ICD-10-CM

## 2017-07-29 DIAGNOSIS — N401 Enlarged prostate with lower urinary tract symptoms: Secondary | ICD-10-CM

## 2017-07-29 DIAGNOSIS — R828 Abnormal findings on cytological and histological examination of urine: Secondary | ICD-10-CM | POA: Diagnosis not present

## 2017-08-12 ENCOUNTER — Ambulatory Visit (INDEPENDENT_AMBULATORY_CARE_PROVIDER_SITE_OTHER): Payer: Medicare Other | Admitting: Urology

## 2017-08-12 DIAGNOSIS — C678 Malignant neoplasm of overlapping sites of bladder: Secondary | ICD-10-CM

## 2017-08-15 ENCOUNTER — Encounter: Payer: Self-pay | Admitting: Family Medicine

## 2017-08-15 ENCOUNTER — Ambulatory Visit (INDEPENDENT_AMBULATORY_CARE_PROVIDER_SITE_OTHER): Payer: Medicare Other | Admitting: Family Medicine

## 2017-08-15 VITALS — BP 138/80 | Temp 98.1°F | Ht 69.0 in | Wt 163.0 lb

## 2017-08-15 DIAGNOSIS — F411 Generalized anxiety disorder: Secondary | ICD-10-CM

## 2017-08-15 DIAGNOSIS — J449 Chronic obstructive pulmonary disease, unspecified: Secondary | ICD-10-CM | POA: Diagnosis not present

## 2017-08-15 DIAGNOSIS — R0902 Hypoxemia: Secondary | ICD-10-CM

## 2017-08-15 MED ORDER — METOPROLOL SUCCINATE ER 25 MG PO TB24: 12.5000 mg | ORAL_TABLET | Freq: Every day | ORAL | 6 refills | Status: DC

## 2017-08-15 NOTE — Progress Notes (Signed)
   Subjective:    Patient ID: Randy [name], male    DOB: [birth year], 84 y.o.   MRN: [id number]  Abdominal Pain  Chronicity: follow up. Pertinent negatives include no diarrhea, dysuria, headaches, hematuria or nausea.  pt states low abdominal pain is doing much better.  Itching around his eyes. Started a 2 -3 weeks ago.  This started a few weeks ago itches on the upper part of the eyes  COPD stable on oxygen gets short of breath with activity uses albuterol as needed does not want to be on a daily inhaler  Was under Dr. Luan Pulling care but he stated that he released him  Patient has chronic anxiety issues Xanax helps some with this denies it causing drowsiness  Chronic myalgias and joint pains tramadol helps him cope he denies abusing it takes approximately 4/day does not cause drowsiness    Review of Systems  Constitutional: Negative for activity change, appetite change and fatigue.  HENT: Negative for congestion and rhinorrhea.   Respiratory: Positive for shortness of breath. Negative for cough, chest tightness and wheezing.   Cardiovascular: Negative for chest pain and leg swelling.  Gastrointestinal: Positive for abdominal pain. Negative for blood in stool, diarrhea and nausea.  Endocrine: Negative for polydipsia and polyphagia.  Genitourinary: Negative for dysuria and hematuria.  Neurological: Negative for weakness and headaches.  Psychiatric/Behavioral: Negative for confusion and dysphoric mood.       Objective:   Physical Exam  Constitutional: He appears well-nourished. No distress.  HENT:  Head: Normocephalic and atraumatic.  Eyes: Right eye exhibits no discharge. Left eye exhibits no discharge.  Neck: No tracheal deviation present.  Cardiovascular: Normal rate, regular rhythm and normal heart sounds.  No murmur heard. Pulmonary/Chest: Effort normal and breath sounds normal. No respiratory distress. He has no wheezes.  Abdominal: Soft. He exhibits no distension. There is  no tenderness. There is no rebound.  Musculoskeletal: He exhibits no edema.  Lymphadenopathy:    He has no cervical adenopathy.  Neurological: He is alert.  Skin: Skin is warm. No rash noted.  Psychiatric: His behavior is normal.  Vitals reviewed.         Assessment & Plan:  1. COPD with hypoxia ([place]) On oxygen does not want to be on a daily as needed  2. GAD (generalized anxiety disorder) Generalized anxiety disorder does well on the  Various arthralgias and body aches does well on tramadol 4 times a day  Patient will follow-up within 3-4 months

## 2017-08-19 ENCOUNTER — Ambulatory Visit: Payer: Medicare Other | Admitting: Urology

## 2017-08-26 ENCOUNTER — Ambulatory Visit: Payer: Medicare Other | Admitting: Urology

## 2017-11-12 ENCOUNTER — Encounter: Payer: Self-pay | Admitting: Family Medicine

## 2017-11-12 ENCOUNTER — Ambulatory Visit (INDEPENDENT_AMBULATORY_CARE_PROVIDER_SITE_OTHER): Payer: Medicare Other | Admitting: Family Medicine

## 2017-11-12 VITALS — BP 138/88 | Ht 69.0 in | Wt 160.0 lb

## 2017-11-12 DIAGNOSIS — J449 Chronic obstructive pulmonary disease, unspecified: Secondary | ICD-10-CM

## 2017-11-12 DIAGNOSIS — R0902 Hypoxemia: Secondary | ICD-10-CM | POA: Diagnosis not present

## 2017-11-12 DIAGNOSIS — G8929 Other chronic pain: Secondary | ICD-10-CM | POA: Diagnosis not present

## 2017-11-12 DIAGNOSIS — I499 Cardiac arrhythmia, unspecified: Secondary | ICD-10-CM | POA: Diagnosis not present

## 2017-11-12 DIAGNOSIS — F411 Generalized anxiety disorder: Secondary | ICD-10-CM

## 2017-11-12 DIAGNOSIS — R0609 Other forms of dyspnea: Secondary | ICD-10-CM

## 2017-11-12 HISTORY — DX: Other chronic pain: G89.29

## 2017-11-12 MED ORDER — TRAMADOL HCL 50 MG PO TABS: ORAL_TABLET | ORAL | 5 refills | Status: DC

## 2017-11-12 MED ORDER — ALPRAZOLAM 0.5 MG PO TABS: ORAL_TABLET | ORAL | 5 refills | Status: DC

## 2017-11-12 NOTE — Progress Notes (Signed)
Subjective:    Patient ID: Randy [name], male    DOB: [birth year], 84 y.o.   MRN: [id number]  COPD  He complains of frequent throat clearing and shortness of breath. There is no cough or wheezing. Primary symptoms comments: Has tightness at times. This is a chronic problem. Associated symptoms include trouble swallowing. Pertinent negatives include no appetite change, chest pain, headaches or rhinorrhea. His past medical history is significant for COPD.  Anxiety  Presents for follow-up visit. Symptoms include shortness of breath. Patient reports no chest pain, confusion, nausea or palpitations. The quality of sleep is fair.   Compliance with medications is 76-100%.  Patient states he does get fatigue when he does try to do much He gets a little winded and out of breath Denies any chest tightness pressure pain or shortness of breath other than what comes with his lung disease and recently when he is active he gets a little short winded denies chest pressure tightness pain patient also relates feeling on edge at times but Xanax helps and also describes ongoing musculoskeletal pain for which he takes tramadol for pain control he does not abuse it denies exceeding 4 tablets/day.  Able to function well.    Review of Systems  Constitutional: Negative for activity change, appetite change and fatigue.  HENT: Positive for trouble swallowing. Negative for congestion and rhinorrhea.   Respiratory: Positive for shortness of breath. Negative for cough, chest tightness, wheezing and stridor.   Cardiovascular: Negative for chest pain, palpitations and leg swelling.  Gastrointestinal: Negative for abdominal pain, diarrhea and nausea.  Endocrine: Negative for polydipsia and polyphagia.  Genitourinary: Negative for dysuria and hematuria.  Neurological: Negative for weakness and headaches.  Psychiatric/Behavioral: Negative for confusion and dysphoric mood.       Objective:   Physical Exam    Constitutional: He appears well-developed and well-nourished. No distress.  HENT:  Head: Normocephalic and atraumatic.  Eyes: Right eye exhibits no discharge. Left eye exhibits no discharge.  Neck: No tracheal deviation present.  Cardiovascular: Normal rate and normal heart sounds. Exam reveals no friction rub.  No murmur heard. Pulmonary/Chest: Effort normal and breath sounds normal. No stridor. No respiratory distress. He has no wheezes.  Musculoskeletal: He exhibits no edema or deformity.  Lymphadenopathy:    He has no cervical adenopathy.  Neurological: He is alert.  Skin: Skin is warm and dry.  Psychiatric: His behavior is normal.  Vitals reviewed.  Patient is on continuous oxygen  EKG shows abnormal rhythm appears to be atrial fibrillation appears different than the most recent EKG recorded in electronic record 25 minutes was spent with the patient.  This statement verifies that 25 minutes was indeed spent with the patient. Greater than half the time was spent in discussion, counseling and answering questions  regarding the issues that the patient came in for today as reflected in the diagnosis (s) please refer to documentation for further details. Greater than half the time was spent discussing abnormal EKG answering questions with that COPD etc. assessment as well as his assessment of chronic anxiety and chronic pain and discussion of treatment measures    Assessment & Plan:  Chronic anxiety use Xanax as directed does not cause drowsiness  COPD with oxygen dependency continue current measures  Irregular heartbeat suspect atrial fibrillation referral for cardiology evaluation may need to be on anticoagulant  Chronic pain discomfort in joints tramadol 4 times daily has been on this long-term continue this measure  Thin liquids cause some dysphagia  but does not happen with solid foods watch closely if progressive may need GI consultation or thickening of liquids

## 2017-11-13 ENCOUNTER — Encounter: Payer: Self-pay | Admitting: Family Medicine

## 2017-11-19 ENCOUNTER — Encounter: Payer: Self-pay | Admitting: Family Medicine

## 2017-12-01 ENCOUNTER — Ambulatory Visit (INDEPENDENT_AMBULATORY_CARE_PROVIDER_SITE_OTHER): Payer: Medicare Other | Admitting: Cardiology

## 2017-12-01 ENCOUNTER — Encounter: Payer: Self-pay | Admitting: Cardiology

## 2017-12-01 VITALS — BP 168/80 | HR 54 | Ht 69.0 in | Wt 161.6 lb

## 2017-12-01 DIAGNOSIS — I499 Cardiac arrhythmia, unspecified: Secondary | ICD-10-CM | POA: Diagnosis not present

## 2017-12-01 NOTE — Patient Instructions (Signed)
Medication Instructions:   Your physician recommends that you continue on your current medications as directed. Please refer to the Current Medication list given to you today.  Labwork:  NONE  Testing/Procedures:  NONE  Follow-Up:  Your physician recommends that you schedule a follow-up appointment in: as needed.   Any Other Special Instructions Will Be Listed Below (If Applicable).  If you need a refill on your cardiac medications before your next appointment, please call your pharmacy. 

## 2017-12-01 NOTE — Progress Notes (Signed)
Clinical Summary Mr. Randy Randy [name] is a 84 y.o.male seen as new consult, referred by Dr Randy Randy [name] for irregular heart beat.   1. Irregular heart beat - noted by pcp at last visit. EKG reviewed from that visit, shows wandering atrial pacemaker.  - denies any palpitations. No issues with lightheadedness. Chronic SOB related to his COPD unchanged.    2. COPD - on home O2 - chronic SOB unchanged  Past Medical History:  Diagnosis Date  . Allergy   . Anxiety    claustraphobia  . BNC (bladder neck contracture)   . Cancer [facility])    bladder cancer  . Cervical spondylosis   . Chronic pain 11/12/2017   Takes tramadol 4 times per day  . Claustrophobia   . Complication of anesthesia    claustraphobia  . COPD (chronic obstructive pulmonary disease) ([place])   . DJD (degenerative joint disease)    cervical  . Dyspnea   . Dysuria OCCASIONAL  . GERD (gastroesophageal reflux disease)    takes prilosec intermittently  . Hiatal hernia   . History of BPH   . Hx of bladder cancer   . Hypertension   . Impaired fasting glucose   . Status post dilation of esophageal narrowing   . Urinary hesitancy      Allergies  Allergen Reactions  . Trazodone And Nefazodone Other (See Comments)    dizziness  . Wellbutrin [Bupropion] Nausea And Vomiting    Nausea dizziness     Current Outpatient Medications  Medication Sig Dispense Refill  . acetaminophen (TYLENOL) 500 MG tablet Take 500 mg by mouth every 6 (six) hours as needed.    Marland Kitchen albuterol (PROVENTIL HFA;VENTOLIN HFA) 108 (90 Base) MCG/ACT inhaler Inhale 2 puffs into the lungs every 6 (six) hours as needed for wheezing. 1 Inhaler 2  . ALPRAZolam (XANAX) 0.5 MG tablet TAKE 1/2 TO 1 TABLET 4 TIMES DAILY AS NEEDED. 70 tablet 5  . famotidine (PEPCID) 20 MG tablet Take 1 tablet by mouth daily as needed. INDIGESTION    . feeding supplement (BOOST HIGH PROTEIN) LIQD Take 1 Container by mouth daily.    . metoprolol succinate (TOPROL-XL) 25 MG 24 hr tablet  Take 0.5 tablets (12.5 mg total) by mouth daily. Take 1/2 tablet each day. 30 tablet 6  . tamsulosin (FLOMAX) 0.4 MG CAPS capsule Take 2 capsules (0.8 mg total) by mouth at bedtime. 60 capsule 12  . traMADol (ULTRAM) 50 MG tablet TAKE (1) TABLET BY MOUTH (4) TIMES DAILY AS NEEDED. 120 tablet 5   No current facility-administered medications for this visit.      Past Surgical History:  Procedure Laterality Date  . CARDIOVASCULAR STRESS TEST  11-08-2009  DR Randy Randy [name]   LOW RISK MYOVIEW/ NO ISCHEMIA/ LVEF 64%  . CATARACT EXTRACTION W/ INTRAOCULAR LENS  IMPLANT, BILATERAL    . CHOLECYSTECTOMY  1984   OPEN  . collapsed lung  1965  . COLONOSCOPY N/A 02/20/2017   Procedure: COLONOSCOPY;  Surgeon: Randy Houston, MD;  Location[facility];  Service: Endoscopy;  Laterality: N/A;  10:30  . CYSTOSCOPY W/ RETROGRADES Bilateral 01/14/2017   Procedure: CYSTOSCOPY WITH RETROGRADE PYELOGRAM;  Surgeon: Randy Gallo, MD;  Location: [facility];  Service: Urology;  Laterality: Bilateral;  . CYSTOSCOPY WITH BIOPSY N/A 11/17/2012   Procedure: CYSTOSCOPY WITH BIOPSY;  Surgeon: Randy Gallo, MD;  Location: [facility];  Service: Urology;  Laterality: N/A;  . CYSTOSCOPY WITH BIOPSY Bilateral 01/14/2017   Procedure: CYSTOSCOPY WITH BIOPSY;  Surgeon: Randy Gallo, MD;  Location: [facility];  Service: Urology;  Laterality: Bilateral;  45 MINS [phone] MEDICARE-[id number] D1 [id number]  FYI: PATIENT WANTS IT NOTED THAT HE IS CLAUSTROPHOBIC!  Marland Kitchen LAPAROSCOPIC REPAIR LARGE HIATAL HERNIA W/ MESH AND NISSEN FUNDOPLICATION  [phone]  . PROSTATE BIOPSY N/A 11/17/2012   Procedure: BIOPSY TRANSRECTAL ULTRASONIC PROSTATE (TUBP);  Surgeon: Randy Gallo, MD;  Location: [facility];  Service: Urology;  Laterality: N/A;  45 MINS 1. TRANSRECTAL ULTRASOUND OF PROSTATE WITH BIOPSY -will need ultrasound tech 2.CYSTOSCOPY, BLADDER BIOPSY - need resectoscope- not Gyrus [id number] Medicare -[id number] BCBS - [id number]  .  REPOSITION OF LENS Right 03/31/2014   Procedure: REPOSITIONING OF INTRAOCULAR LENS IMPLANT RIGHT EYE;  Surgeon: Randy [name] , MD;  Location: [facility];  Service: Ophthalmology;  Laterality: Right;  . RETINAL DETACHMENT SURGERY  1988  . TRANSURETHRAL RESECTION OF BLADDER NECK  05/01/2012   Procedure: TRANSURETHRAL RESECTION OF BLADDER NECK;  Surgeon: Randy Gallo, MD;  Location: [facility];  Service: Urology;  Laterality: N/A;  [place]   . TRANSURETHRAL RESECTION OF BLADDER TUMOR  01/06/2012   Procedure: TRANSURETHRAL RESECTION OF BLADDER TUMOR (TURBT);  Surgeon: Randy Gallo, MD;  Location: [facility];  Service: Urology;  Laterality: N/A;  . TRANSURETHRAL RESECTION OF PROSTATE    . TRANSURETHRAL RESECTION OF PROSTATE       Allergies  Allergen Reactions  . Trazodone And Nefazodone Other (See Comments)    dizziness  . Wellbutrin [Bupropion] Nausea And Vomiting    Nausea dizziness      Family History  Problem Relation Age of Onset  . Stroke Mother   . Kidney failure Father   . Heart disease Brother   . Colon cancer Neg Hx   . Esophageal cancer Neg Hx   . Rectal cancer Neg Hx   . Stomach cancer Neg Hx      Social History [name] reports that he quit smoking about 30 years ago. His smoking use included cigarettes. He has a 40.00 pack-year smoking history. He has never used smokeless tobacco. Mr. Randy Randy [name] reports that he does not drink alcohol.   Review of Systems CONSTITUTIONAL: No weight loss, fever, chills, weakness or fatigue.  HEENT: Eyes: No visual loss, blurred vision, double vision or yellow sclerae.No hearing loss, sneezing, congestion, runny nose or sore throat.  SKIN: No rash or itching.  CARDIOVASCULAR: per hpi RESPIRATORY: per hpi GASTROINTESTINAL: No anorexia, nausea, vomiting or diarrhea. No abdominal pain or blood.  GENITOURINARY: No burning on urination, no  polyuria NEUROLOGICAL: No headache, dizziness, syncope, paralysis, ataxia, numbness or tingling in the extremities. No change in bowel or bladder control.  MUSCULOSKELETAL: No muscle, back pain, joint pain or stiffness.  LYMPHATICS: No enlarged nodes. No history of splenectomy.  PSYCHIATRIC: No history of depression or anxiety.  ENDOCRINOLOGIC: No reports of sweating, cold or heat intolerance. No polyuria or polydipsia.  Marland Kitchen   Physical Examination Vitals:   12/01/17 1339 12/01/17 1352  BP: (!) 180/80 (!) 168/80  Pulse: 68 (!) 54  SpO2: 93% 97%   Vitals:   12/01/17 1339  Weight: 161 lb 9.6 oz (73.3 kg)  Height: 5\' 9"  (1.753 m)    Gen: resting comfortably, no acute distress HEENT: no scleral icterus, pupils equal round and reactive, no palptable cervical adenopathy,  CV: irreg, no m/r/g, no jvd Resp: Clear to auscultation bilaterally GI: abdomen is soft, non-tender, non-distended, normal bowel sounds, no hepatosplenomegaly MSK: extremities are warm, no  edema.  Skin: warm, no rash Neuro:  no focal deficits Psych: appropriate affect      Assessment and Plan  1. Irregular heart beat - EKG in clinic today shows wandering atrial pacemaker. EKG from pcp reviewed and shows same finding - no symptoms - this is an overall benign finding and does not require any further workup of treatment. Would however consider adding TSH to next labs.   No further workup at this time, follow up as needed.       Arnoldo Lenis, M.D.

## 2017-12-02 ENCOUNTER — Ambulatory Visit (INDEPENDENT_AMBULATORY_CARE_PROVIDER_SITE_OTHER): Payer: Medicare Other | Admitting: Urology

## 2017-12-02 ENCOUNTER — Other Ambulatory Visit (HOSPITAL_COMMUNITY)
Admission: RE | Admit: 2017-12-02 | Discharge: 2017-12-02 | Disposition: A | Discharge status: Home / Self Care | Payer: Medicare Other | Source: Ambulatory Visit | Attending: Urology | Admitting: Urology

## 2017-12-02 DIAGNOSIS — N401 Enlarged prostate with lower urinary tract symptoms: Secondary | ICD-10-CM | POA: Diagnosis not present

## 2017-12-02 DIAGNOSIS — C678 Malignant neoplasm of overlapping sites of bladder: Secondary | ICD-10-CM

## 2017-12-02 DIAGNOSIS — R351 Nocturia: Secondary | ICD-10-CM

## 2017-12-02 DIAGNOSIS — R82998 Other abnormal findings in urine: Secondary | ICD-10-CM | POA: Diagnosis not present

## 2017-12-06 ENCOUNTER — Encounter: Payer: Self-pay | Admitting: Cardiology

## 2017-12-23 ENCOUNTER — Ambulatory Visit (INDEPENDENT_AMBULATORY_CARE_PROVIDER_SITE_OTHER): Payer: Medicare Other | Admitting: Urology

## 2017-12-23 DIAGNOSIS — C678 Malignant neoplasm of overlapping sites of bladder: Secondary | ICD-10-CM | POA: Diagnosis not present

## 2017-12-30 ENCOUNTER — Ambulatory Visit (INDEPENDENT_AMBULATORY_CARE_PROVIDER_SITE_OTHER): Payer: Medicare Other | Admitting: Urology

## 2017-12-30 DIAGNOSIS — C678 Malignant neoplasm of overlapping sites of bladder: Secondary | ICD-10-CM

## 2018-01-06 ENCOUNTER — Ambulatory Visit (INDEPENDENT_AMBULATORY_CARE_PROVIDER_SITE_OTHER): Payer: Medicare Other | Admitting: Urology

## 2018-01-06 DIAGNOSIS — C678 Malignant neoplasm of overlapping sites of bladder: Secondary | ICD-10-CM

## 2018-01-08 ENCOUNTER — Telehealth: Payer: Self-pay | Admitting: *Deleted

## 2018-01-08 NOTE — Telephone Encounter (Signed)
Called patient concerning repeat Ct chest due in July 2019. Follow up pulmonary nodule, aortic dilation. Patient stated he has been doing this for years and it has been fine and he is 84 years old and he doesn't see the need to keep going thru it and and wanted your opinion on the necessity of it all at his age- he doesn't see the need at  His age unless you tell him it is absolutely necessary.

## 2018-01-11 NOTE — Telephone Encounter (Signed)
Ultimately the decision is the patient's.  The reason to do the scan is because if the dilated thoracic aorta becomes large enough then typically surgery is indicated.  I think it would be fine for the patient to delay this momentarily and schedule a follow-up office visit somewhere within the next 4 weeks specifically not only to follow-up on chronic health issues but more importantly to discuss the need for further testing to follow dilated thoracic aorta.

## 2018-01-12 ENCOUNTER — Telehealth: Payer: Self-pay | Admitting: Family Medicine

## 2018-01-12 NOTE — Telephone Encounter (Signed)
See message 01/09/18

## 2018-01-12 NOTE — Telephone Encounter (Signed)
[facility] 01/12/18

## 2018-01-12 NOTE — Telephone Encounter (Signed)
Left message to return call 

## 2018-01-12 NOTE — Telephone Encounter (Signed)
Patient was returning a call to the nurse Unable to add this to previous message

## 2018-01-14 NOTE — Telephone Encounter (Signed)
Patient is aware and he was transferred up front to set up an appointment with [place] to discuss.

## 2018-02-03 ENCOUNTER — Ambulatory Visit (INDEPENDENT_AMBULATORY_CARE_PROVIDER_SITE_OTHER): Payer: Medicare Other | Admitting: Family Medicine

## 2018-02-03 ENCOUNTER — Encounter: Payer: Self-pay | Admitting: Family Medicine

## 2018-02-03 VITALS — BP 136/92 | Ht 69.0 in | Wt 163.8 lb

## 2018-02-03 DIAGNOSIS — J449 Chronic obstructive pulmonary disease, unspecified: Secondary | ICD-10-CM | POA: Diagnosis not present

## 2018-02-03 DIAGNOSIS — I77810 Thoracic aortic ectasia: Secondary | ICD-10-CM

## 2018-02-03 DIAGNOSIS — I10 Essential (primary) hypertension: Secondary | ICD-10-CM | POA: Diagnosis not present

## 2018-02-03 DIAGNOSIS — R0902 Hypoxemia: Secondary | ICD-10-CM

## 2018-02-03 NOTE — Progress Notes (Signed)
   Subjective:    Patient ID: Randy [name], male    DOB: 04/18/1929, 84 y.o.   MRN: [id number]  HPI Pt here today to clarify what test is having ordered. Pt has completed a CT abdomen pelvis with contrast and a ct chest without contrast.   Patient presents today for informed discussion regarding CAT scan We discussed his CAT scan from last year We also discussed the dilated thoracic aorta We also discussed a small pulmonary nodule We also discussed the options versus re-looking at it again or not looking at it at all We did discuss how surgery could be a recommendation depending on the findings Patient uncertain about surgery at the present moment but is interested in doing testing  Review of Systems Does get short of breath with activity this is consistent with his COPD denies high fever chills sweats wheezing difficulty breathing other than what is stated above with shortness of breath with mild activity no chest pressure pain sweats or chills Objective:   Physical Exam Lungs clear respiratory rate normal heart regular no murmurs chest wall nontender neck no masses   15 minutes was spent with patient today discussing healthcare issues which they came.  More than 50% of this visit-total duration of visit-was spent in counseling and coordination of care.  Please see diagnosis regarding the focus of this coordination and care     Assessment & Plan:  Patient had a abnormal CT scan last year is recommended to do a follow-up one year later he is due to have this done so therefore we will progress with doing a CTA of the chest  Certainly if worsening issues may need to get vascular surgery approved/consulted but there is a possibility that the patient would not want to do any surgery-but he is committed to wanting to get this test repeated

## 2018-02-04 ENCOUNTER — Encounter: Payer: Self-pay | Admitting: Family Medicine

## 2018-02-04 ENCOUNTER — Telehealth: Payer: Self-pay | Admitting: Family Medicine

## 2018-02-04 ENCOUNTER — Other Ambulatory Visit: Payer: Self-pay | Admitting: *Deleted

## 2018-02-04 LAB — BASIC METABOLIC PANEL
BUN / CREAT RATIO: 15 (ref 10–24)
BUN: 19 mg/dL (ref 8–27)
CHLORIDE: 97 mmol/L (ref 96–106)
CO2: 30 mmol/L — ABNORMAL HIGH (ref 20–29)
CREATININE: 1.24 mg/dL (ref 0.76–1.27)
Calcium: 9.2 mg/dL (ref 8.6–10.2)
GFR calc non Af Amer: 52 mL/min/{1.73_m2} — ABNORMAL LOW (ref >59)
GFR, EST AFRICAN AMERICAN: 60 mL/min/{1.73_m2} (ref >59)
GLUCOSE: 101 mg/dL — AB (ref 65–99)
Potassium: 5.1 mmol/L (ref 3.5–5.2)
Sodium: 142 mmol/L (ref 134–144)

## 2018-02-04 MED ORDER — BUDESONIDE-FORMOTEROL FUMARATE 160-4.5 MCG/ACT IN AERO: 2.0000 | INHALATION_SPRAY | Freq: Two times a day (BID) | RESPIRATORY_TRACT | 5 refills | Status: DC

## 2018-02-04 MED ORDER — ALBUTEROL SULFATE HFA 108 (90 BASE) MCG/ACT IN AERS: 2.0000 | INHALATION_SPRAY | Freq: Four times a day (QID) | RESPIRATORY_TRACT | 11 refills | Status: DC | PRN

## 2018-02-04 NOTE — Telephone Encounter (Signed)
Patient was supposed to have Rx for inhaler called in yesterday, but [facility] doesn't have it.  Please advise.

## 2018-02-04 NOTE — Telephone Encounter (Signed)
Seen yesterday

## 2018-02-04 NOTE — Telephone Encounter (Signed)
Discussed with pt. meds sent to pharm.  

## 2018-02-04 NOTE — Telephone Encounter (Signed)
May have 12 refills on his albuterol inhaler We also discussed the inhaler for the COPD to use on a regular basis to see if it may help It is difficult to know what his insurance covers Symbicort 160/4.5 2 inhalations twice daily May have 6 refills Please put on the prescription that if his insurance does not cover this 1 but does cover other ones to have the pharmacy notify us which one it will cover it we will be happy to switch choices

## 2018-03-09 ENCOUNTER — Ambulatory Visit (HOSPITAL_COMMUNITY)
Admission: RE | Admit: 2018-03-09 | Discharge: 2018-03-09 | Disposition: A | Discharge status: Home / Self Care | Payer: Medicare Other | Source: Ambulatory Visit | Attending: Family Medicine | Admitting: Family Medicine

## 2018-03-09 DIAGNOSIS — I712 Thoracic aortic aneurysm, without rupture: Secondary | ICD-10-CM | POA: Insufficient documentation

## 2018-03-09 DIAGNOSIS — R911 Solitary pulmonary nodule: Secondary | ICD-10-CM | POA: Diagnosis not present

## 2018-03-09 DIAGNOSIS — I77810 Thoracic aortic ectasia: Secondary | ICD-10-CM | POA: Diagnosis present

## 2018-03-09 DIAGNOSIS — K760 Fatty (change of) liver, not elsewhere classified: Secondary | ICD-10-CM | POA: Diagnosis not present

## 2018-03-09 DIAGNOSIS — I700 Atherosclerosis of aorta: Secondary | ICD-10-CM | POA: Diagnosis not present

## 2018-03-09 DIAGNOSIS — R918 Other nonspecific abnormal finding of lung field: Secondary | ICD-10-CM | POA: Diagnosis not present

## 2018-03-09 DIAGNOSIS — J449 Chronic obstructive pulmonary disease, unspecified: Secondary | ICD-10-CM | POA: Diagnosis not present

## 2018-03-09 MED ORDER — IOPAMIDOL (ISOVUE-370) INJECTION 76%
100.0000 mL | Freq: Once | INTRAVENOUS | Status: AC | PRN
Start: 2018-03-09 — End: 2018-03-09
  Administered 2018-03-09: 100 mL via INTRAVENOUS

## 2018-03-13 ENCOUNTER — Ambulatory Visit (INDEPENDENT_AMBULATORY_CARE_PROVIDER_SITE_OTHER): Payer: Medicare Other | Admitting: Family Medicine

## 2018-03-13 ENCOUNTER — Encounter: Payer: Self-pay | Admitting: Family Medicine

## 2018-03-13 VITALS — BP 132/80 | Ht 69.0 in | Wt 163.8 lb

## 2018-03-13 DIAGNOSIS — I10 Essential (primary) hypertension: Secondary | ICD-10-CM

## 2018-03-13 DIAGNOSIS — I77810 Thoracic aortic ectasia: Secondary | ICD-10-CM

## 2018-03-13 MED ORDER — METOPROLOL SUCCINATE ER 25 MG PO TB24: 12.5000 mg | ORAL_TABLET | Freq: Every day | ORAL | 6 refills | Status: DC

## 2018-03-13 NOTE — Progress Notes (Signed)
Placed in reminder file

## 2018-03-13 NOTE — Progress Notes (Signed)
   Subjective:    Patient ID: Randy [name], male    DOB: August 26, 1928, 84 y.o.   MRN: [id number]  HPIpt arrives with daughter Randy [name].  Pt following up for ct scan results.   Would like to discuss taking cbd oil for pain. Has general pain all over. Takes tramadol and tylenol.     Review of Systems     Objective:   Physical Exam  Lungs clear respiratory rate normal heart regular no murmurs pulse normal skin warm dry neurologic grossly normal  15 minutes spent today discussing his chronic pain Discussing alternatives that could be added to what he is already doing He is already on maximal dose of tramadol that we are comfortable with His daughter would like for him to try CBD oil I believe this would be reasonable for him to do Blood pressure good control    Assessment & Plan:  Aortic aneurysm stable repeat CT scan in 1 years time  Chronic pain discomfort follow-up again in 3 months time may try a trial of CBD oil continue tramadol  BPH patient does have follow-up office visit with urology this coming months

## 2018-04-07 ENCOUNTER — Ambulatory Visit (INDEPENDENT_AMBULATORY_CARE_PROVIDER_SITE_OTHER): Payer: Medicare Other | Admitting: Urology

## 2018-04-07 ENCOUNTER — Other Ambulatory Visit (HOSPITAL_COMMUNITY)
Admission: RE | Admit: 2018-04-07 | Discharge: 2018-04-07 | Disposition: A | Discharge status: Home / Self Care | Payer: Medicare Other | Source: Ambulatory Visit | Attending: Family Medicine | Admitting: Family Medicine

## 2018-04-07 DIAGNOSIS — N401 Enlarged prostate with lower urinary tract symptoms: Secondary | ICD-10-CM | POA: Diagnosis not present

## 2018-04-07 DIAGNOSIS — C678 Malignant neoplasm of overlapping sites of bladder: Secondary | ICD-10-CM

## 2018-04-07 DIAGNOSIS — R82998 Other abnormal findings in urine: Secondary | ICD-10-CM | POA: Diagnosis not present

## 2018-04-28 ENCOUNTER — Ambulatory Visit (INDEPENDENT_AMBULATORY_CARE_PROVIDER_SITE_OTHER): Payer: Medicare Other | Admitting: Urology

## 2018-04-28 DIAGNOSIS — Z5111 Encounter for antineoplastic chemotherapy: Secondary | ICD-10-CM | POA: Diagnosis not present

## 2018-04-28 DIAGNOSIS — C678 Malignant neoplasm of overlapping sites of bladder: Secondary | ICD-10-CM | POA: Diagnosis not present

## 2018-05-05 ENCOUNTER — Ambulatory Visit (INDEPENDENT_AMBULATORY_CARE_PROVIDER_SITE_OTHER): Payer: Medicare Other | Admitting: Urology

## 2018-05-05 DIAGNOSIS — C678 Malignant neoplasm of overlapping sites of bladder: Secondary | ICD-10-CM

## 2018-05-12 ENCOUNTER — Ambulatory Visit (INDEPENDENT_AMBULATORY_CARE_PROVIDER_SITE_OTHER): Payer: Medicare Other | Admitting: Urology

## 2018-05-12 DIAGNOSIS — C678 Malignant neoplasm of overlapping sites of bladder: Secondary | ICD-10-CM

## 2018-05-13 ENCOUNTER — Other Ambulatory Visit: Payer: Self-pay | Admitting: Family Medicine

## 2018-05-13 NOTE — Telephone Encounter (Signed)
May refill x4

## 2018-05-13 NOTE — Telephone Encounter (Signed)
This plus four refills

## 2018-05-19 ENCOUNTER — Encounter: Payer: Self-pay | Admitting: Family Medicine

## 2018-05-19 ENCOUNTER — Ambulatory Visit (INDEPENDENT_AMBULATORY_CARE_PROVIDER_SITE_OTHER): Payer: Medicare Other | Admitting: Family Medicine

## 2018-05-19 VITALS — BP 138/88 | Temp 97.0°F | Ht 69.0 in | Wt 163.0 lb

## 2018-05-19 DIAGNOSIS — J441 Chronic obstructive pulmonary disease with (acute) exacerbation: Secondary | ICD-10-CM

## 2018-05-19 DIAGNOSIS — L219 Seborrheic dermatitis, unspecified: Secondary | ICD-10-CM | POA: Diagnosis not present

## 2018-05-19 MED ORDER — TRIAMCINOLONE ACETONIDE 0.1 % EX CREA: TOPICAL_CREAM | CUTANEOUS | 0 refills | Status: DC

## 2018-05-19 MED ORDER — TIOTROPIUM BROMIDE MONOHYDRATE 18 MCG IN CAPS: 18.0000 ug | ORAL_CAPSULE | Freq: Every day | RESPIRATORY_TRACT | 12 refills | Status: DC

## 2018-05-19 NOTE — Progress Notes (Signed)
   Subjective:    Patient ID: Randy [name], male    DOB: Mar 09, 1929, 84 y.o.   MRN: [id number]  HPI  Patient is here today accompanied by his daughter with complaints of a itch/ burning sensation from top of chest up to his eyebrows.He states this started several days ago and is getting worse. Patient does wear O2 cannula,asked if this could possible be what is causing he says he has been wearing it for the last two years.He has been using a lotion,but has not helped any. He says he is more short of breath than he has been. He is on 2 liters of O2.  Itching to neck, ears, and eyebrows, worse the last couple days. Has tried some otc salve which hasn't helped.   Reports increased shortness of breath this week, states putting on clothes has been increasingly difficult. Pt reports he has not been using any inhaler daily, states using symbicort prn. Feels like he has a lot of phlegm, rare productive cough. No fevers.   Also reports rare occurrence of involuntary muscle spasms, states it occurs briefly and can be any muscle, does not occur daily, reports maybe a few times per week.   Review of Systems  Constitutional: Negative for chills and fever.  HENT: Negative for congestion, ear pain and sore throat.   Respiratory: Positive for shortness of breath. Negative for wheezing.   Cardiovascular: Negative for chest pain.  Skin:       Positive for pruritus, see HPI       Objective:   Physical Exam  Constitutional: He is oriented to person, place, and time. He appears well-developed and well-nourished. No distress.  HENT:  Head: Normocephalic and atraumatic.  Right Ear: Tympanic membrane normal.  Left Ear: Tympanic membrane normal.  Mouth/Throat: Oropharynx is clear and moist.  Eyes: Right eye exhibits no discharge. Left eye exhibits no discharge.  Neck: Neck supple.  Cardiovascular: Normal rate, regular rhythm and normal heart sounds.  Pulmonary/Chest: Effort normal and breath sounds normal.  No respiratory distress. He has no wheezes.  Lymphadenopathy:    He has no cervical adenopathy.  Neurological: He is alert and oriented to person, place, and time.  Skin: Skin is warm and dry. No rash noted.  White flaking of skin noted behind both ears, to eyebrows, and creases of nose.   Psychiatric: He has a normal mood and affect.  Nursing note and vitals reviewed.     Assessment & Plan:  1. Seborrheic dermatitis Will treat with triamcinolone cream. Educated on proper administration.   2. Chronic obstructive pulmonary disease with acute exacerbation (HCC) Pt is not taking his inhalers as he should be. Discussed at length how he should be taking each inhaler and instructions written down for him to follow. New rx sent in for daily spiriva inhaler to add to bid use of symbicort along with prn use of albuterol inhaler. Warning signs discussed. Pt should f/u in about 3 weeks with Dr. Nicki Reaper to see how this regimen is doing. Will also further evaluate his memory/cognition and the involuntary muscle spasms at that time.  Dr. Sallee Lange was consulted on this case and is in agreement with the above treatment plan.

## 2018-06-09 ENCOUNTER — Encounter: Payer: Self-pay | Admitting: Family Medicine

## 2018-06-09 ENCOUNTER — Ambulatory Visit (INDEPENDENT_AMBULATORY_CARE_PROVIDER_SITE_OTHER): Payer: Medicare Other | Admitting: Family Medicine

## 2018-06-09 VITALS — BP 148/88 | Ht 69.0 in | Wt 162.0 lb

## 2018-06-09 DIAGNOSIS — J449 Chronic obstructive pulmonary disease, unspecified: Secondary | ICD-10-CM | POA: Diagnosis not present

## 2018-06-09 DIAGNOSIS — R350 Frequency of micturition: Secondary | ICD-10-CM | POA: Diagnosis not present

## 2018-06-09 DIAGNOSIS — N401 Enlarged prostate with lower urinary tract symptoms: Secondary | ICD-10-CM | POA: Diagnosis not present

## 2018-06-09 DIAGNOSIS — R0902 Hypoxemia: Secondary | ICD-10-CM | POA: Diagnosis not present

## 2018-06-09 MED ORDER — ZOSTER VAC RECOMB ADJUVANTED 50 MCG/0.5ML IM SUSR: 0.5000 mL | Freq: Once | INTRAMUSCULAR | 1 refills | Status: AC

## 2018-06-09 MED ORDER — TAMSULOSIN HCL 0.4 MG PO CAPS: 0.8000 mg | ORAL_CAPSULE | Freq: Every day | ORAL | 12 refills | Status: DC

## 2018-06-09 MED ORDER — BUDESONIDE-FORMOTEROL FUMARATE 160-4.5 MCG/ACT IN AERO: 2.0000 | INHALATION_SPRAY | Freq: Two times a day (BID) | RESPIRATORY_TRACT | 5 refills | Status: DC

## 2018-06-09 NOTE — Progress Notes (Signed)
   Subjective:    Patient ID: Randy [name], male    DOB: 1928/09/19, 84 y.o.   MRN: [id number]  HPI Patient is here today to follow up on his COPD.Patient wants to know if there is a generic form of Symbicort. He states his breathing is doing much better.He is having some problems with starting his urine stream, he also is having some constipation and thinks unable to start urine due to that. He has been using a stool softer and increased his water intake. He has a history of bladder cancer.He thinks when he takes a stool softner the urine situation is a lot better.  Apparently earlier was not using the inhaler in proper ways but now is using the Symbicort on a regular basis and also using the Spiriva on a regular basis he wonders if this may be interfering with his urination He also relates intermittently gets firm bowel movements he tries to eat relatively healthy but does not get as much fiber in his diet as he should  Review of Systems  Constitutional: Negative for diaphoresis and fatigue.  HENT: Negative for congestion and rhinorrhea.   Respiratory: Negative for cough and shortness of breath.   Cardiovascular: Negative for chest pain and leg swelling.  Gastrointestinal: Negative for abdominal pain and diarrhea.  Skin: Negative for color change and rash.  Neurological: Negative for dizziness and headaches.  Psychiatric/Behavioral: Negative for behavioral problems and confusion.       Objective:   Physical Exam  Constitutional: He appears well-nourished. No distress.  HENT:  Head: Normocephalic and atraumatic.  Eyes: Right eye exhibits no discharge. Left eye exhibits no discharge.  Neck: No tracheal deviation present.  Cardiovascular: Normal rate, regular rhythm and normal heart sounds.  No murmur heard. Pulmonary/Chest: Effort normal and breath sounds normal. No respiratory distress.  Musculoskeletal: He exhibits no edema.  Lymphadenopathy:    He has no cervical adenopathy.    Neurological: He is alert. Coordination normal.  Skin: Skin is warm and dry.  Psychiatric: He has a normal mood and affect. His behavior is normal.  Vitals reviewed.         Assessment & Plan:  BPH-continue Flomax as directed hopefully this will continue to help him  Stopping Spiriva for the next 3 weeks would be reasonable to lessen effect on the prostate and potentially allow for better urination family will give [place] feedback on how that is going  COPD no exacerbation currently continue oxygen in addition to this if any febrile illness through the winter follow-up otherwise follow-up in April

## 2018-06-09 NOTE — Patient Instructions (Signed)

## 2018-06-17 ENCOUNTER — Ambulatory Visit: Payer: Medicare Other | Admitting: Family Medicine

## 2018-08-14 DIAGNOSIS — Z961 Presence of intraocular lens: Secondary | ICD-10-CM | POA: Diagnosis not present

## 2018-08-14 DIAGNOSIS — Z9841 Cataract extraction status, right eye: Secondary | ICD-10-CM | POA: Diagnosis not present

## 2018-08-14 DIAGNOSIS — H04123 Dry eye syndrome of bilateral lacrimal glands: Secondary | ICD-10-CM | POA: Diagnosis not present

## 2018-08-14 DIAGNOSIS — H33001 Unspecified retinal detachment with retinal break, right eye: Secondary | ICD-10-CM | POA: Diagnosis not present

## 2018-09-09 ENCOUNTER — Other Ambulatory Visit: Payer: Self-pay | Admitting: Family Medicine

## 2018-09-09 NOTE — Telephone Encounter (Signed)
May have this and 3 refills 

## 2018-09-15 ENCOUNTER — Other Ambulatory Visit: Payer: Self-pay | Admitting: Family Medicine

## 2018-09-15 NOTE — Telephone Encounter (Signed)
I feel like we have handled this but since the request is coming please refill this plus four additional refills

## 2018-09-28 ENCOUNTER — Encounter: Payer: Self-pay | Admitting: Family Medicine

## 2018-09-28 ENCOUNTER — Other Ambulatory Visit: Payer: Self-pay

## 2018-09-28 ENCOUNTER — Ambulatory Visit (INDEPENDENT_AMBULATORY_CARE_PROVIDER_SITE_OTHER): Payer: Medicare Other | Admitting: Family Medicine

## 2018-09-28 VITALS — Ht 69.0 in

## 2018-09-28 DIAGNOSIS — K5909 Other constipation: Secondary | ICD-10-CM | POA: Diagnosis not present

## 2018-09-28 DIAGNOSIS — R3916 Straining to void: Secondary | ICD-10-CM | POA: Diagnosis not present

## 2018-09-28 DIAGNOSIS — N401 Enlarged prostate with lower urinary tract symptoms: Secondary | ICD-10-CM

## 2018-09-28 NOTE — Progress Notes (Signed)
   Subjective:    Patient ID: Randy [name], male    DOB: [birth year], 85 y.o.   MRN: [id number]  Abdominal Pain  This is a recurrent problem. Episode onset: december. Pain location: low abdominal region. moves around to left side, right side and middle. The quality of the pain is dull. Associated symptoms include a fever. Pertinent negatives include no diarrhea, nausea or vomiting. Nothing aggravates the pain. Relieved by: tramadol. takes one qid.  hard to urinate or have a BM. A little fever every day but doesn't last long. Has increased his stool softners and taking fiber.  Takes otc generic stool softners. Takes about 2 daily for 2 -3 days then will stop. Abdominal pain only happens when he stands.   Virtual Visit via Telephone Note  I connected with Randy [name] on 09/28/18 at  1:10 PM EDT by telephone and verified that I am speaking with the correct person using two identifiers.   I discussed the limitations, risks, security and privacy concerns of performing an evaluation and management service by telephone and the availability of in person appointments. I also discussed with the patient that there may be a patient responsible charge related to this service. The patient expressed understanding and agreed to proceed.   History of Present Illness: I had a good long discussion with the patient as well as his daughter all of them were present on the phone Video was not available 18 minutes spent with patient Patient having difficult time urinating with slow flow taking tamsulosin every evening to let them in addition to this he is also had severe constipation but no blood in his bowel movements some intermittent lower abdominal discomfort but no fever chills wheezing or difficulty breathing.  PMH benign does have history of bladder cancer but no hematuria   Observations/Objective:  Physical exam was not capable because of telephone visit. Assessment and Plan: Constipation-add MiraLAX 1 capful  8 ounces of water on a daily basis Follow-up if progressive troubles Continue take the stool softener on a regular basis Recheck if worsening issues  BPH continue tamsulosin 2 daily certainly if not seeing significant improvement may need to consider other measures possibly referral to urology  Follow Up Instructions:    I discussed the assessment and treatment plan with the patient. The patient was provided an opportunity to ask questions and all were answered. The patient agreed with the plan and demonstrated an understanding of the instructions.   The patient was advised to call back or seek an in-person evaluation if the symptoms worsen or if the condition fails to improve as anticipated.  I provided 18 minutes of non-face-to-face time during this encounter.   Dayton Bailiff, LPN   Review of Systems  Constitutional: Positive for fever.  Gastrointestinal: Positive for abdominal pain. Negative for diarrhea, nausea and vomiting.       Objective:   Physical Exam  See above      Assessment & Plan:  See above

## 2018-10-13 ENCOUNTER — Ambulatory Visit: Payer: Medicare Other | Admitting: Family Medicine

## 2018-10-14 ENCOUNTER — Ambulatory Visit (INDEPENDENT_AMBULATORY_CARE_PROVIDER_SITE_OTHER): Payer: Medicare Other | Admitting: Family Medicine

## 2018-10-14 ENCOUNTER — Encounter: Payer: Self-pay | Admitting: Family Medicine

## 2018-10-14 ENCOUNTER — Other Ambulatory Visit: Payer: Self-pay

## 2018-10-14 VITALS — Wt 163.0 lb

## 2018-10-14 DIAGNOSIS — K5909 Other constipation: Secondary | ICD-10-CM

## 2018-10-14 DIAGNOSIS — J449 Chronic obstructive pulmonary disease, unspecified: Secondary | ICD-10-CM

## 2018-10-14 DIAGNOSIS — R0902 Hypoxemia: Secondary | ICD-10-CM | POA: Diagnosis not present

## 2018-10-14 DIAGNOSIS — R3916 Straining to void: Secondary | ICD-10-CM

## 2018-10-14 DIAGNOSIS — N401 Enlarged prostate with lower urinary tract symptoms: Secondary | ICD-10-CM

## 2018-10-14 MED ORDER — TRIAMCINOLONE ACETONIDE 0.1 % EX CREA: TOPICAL_CREAM | CUTANEOUS | 2 refills | Status: AC

## 2018-10-14 NOTE — Progress Notes (Signed)
   Subjective:  Able to do telephone only  Patient ID: Randy [name], male    DOB: [birth year], 85 y.o.   MRN: [id number]  HPI Pt having 4 month follow up. Pt was seen in December 2019 for COPD with hypoxia. Pt states his stools have been better. Pt is taking Miralax daily. Pt is also taking stool softener also. Pt states that he has some itching around the neck and up towards face. Pt is wanting to know if he can not take Symbicort due to him having some itching around neck and face. Around eyes and eyebrows are worse. Pt would like refill on Triamcinolone cream.  Pt states COPD is doing fairly well.   Virtual Visit via Telephone Note  I connected with Randy [name] on 10/14/18 at  2:00 PM EDT by telephone and verified that I am speaking with the correct person using two identifiers.   I discussed the limitations, risks, security and privacy concerns of performing an evaluation and management service by telephone and the availability of in person appointments. I also discussed with the patient that there may be a patient responsible charge related to this service. The patient expressed understanding and agreed to proceed.   History of Present Illness:    Observations/Objective:   Assessment and Plan: His COPD seems to be doing well he is taking his medicine on a regular basis denies any breathing issues  Bowel movements are doing better with the laxative denies any blood in his bowel movements denies abdominal pain states his appetite is doing good  Mild rash could be related to his medications but it could be related to something else triamcinolone as needed would be fine warm soapy water once or twice a day on the face  Follow-up in approximately 3 to 4 months sooner if any problems Follow Up Instructions:    I discussed the assessment and treatment plan with the patient. The patient was provided an opportunity to ask questions and all were answered. The patient agreed with the plan and  demonstrated an understanding of the instructions.   The patient was advised to call back or seek an in-person evaluation if the symptoms worsen or if the condition fails to improve as anticipated.  I provided 15 minutes of non-face-to-face time during this encounter.   Vicente Males, LPN    Review of Systems     Objective:   Physical Exam        Assessment & Plan:  COPD seems to be stable.  Denies any major setbacks or problems  The rash on his face could be contact dermatitis triamcinolone twice daily as needed warm soapy water once or twice a day  Constipation issues seems to be doing better continue MiraLAX  BPH with urinary frequency doing well with medicine  Patient will follow-up in 3 to 4 months

## 2019-01-11 ENCOUNTER — Other Ambulatory Visit: Payer: Self-pay | Admitting: Family Medicine

## 2019-01-11 NOTE — Telephone Encounter (Signed)
May have this +1 refill will need follow-up visit by September

## 2019-01-26 ENCOUNTER — Ambulatory Visit (INDEPENDENT_AMBULATORY_CARE_PROVIDER_SITE_OTHER): Payer: Medicare Other | Admitting: Family Medicine

## 2019-01-26 ENCOUNTER — Other Ambulatory Visit: Payer: Self-pay

## 2019-01-26 ENCOUNTER — Encounter: Payer: Self-pay | Admitting: Family Medicine

## 2019-01-26 VITALS — BP 130/82 | Temp 97.9°F | Wt 165.4 lb

## 2019-01-26 DIAGNOSIS — R0902 Hypoxemia: Secondary | ICD-10-CM | POA: Diagnosis not present

## 2019-01-26 DIAGNOSIS — L299 Pruritus, unspecified: Secondary | ICD-10-CM | POA: Diagnosis not present

## 2019-01-26 DIAGNOSIS — J449 Chronic obstructive pulmonary disease, unspecified: Secondary | ICD-10-CM | POA: Diagnosis not present

## 2019-01-26 DIAGNOSIS — E785 Hyperlipidemia, unspecified: Secondary | ICD-10-CM

## 2019-01-26 DIAGNOSIS — I10 Essential (primary) hypertension: Secondary | ICD-10-CM | POA: Diagnosis not present

## 2019-01-26 DIAGNOSIS — R252 Cramp and spasm: Secondary | ICD-10-CM | POA: Diagnosis not present

## 2019-01-26 DIAGNOSIS — F411 Generalized anxiety disorder: Secondary | ICD-10-CM

## 2019-01-26 DIAGNOSIS — D692 Other nonthrombocytopenic purpura: Secondary | ICD-10-CM

## 2019-01-26 MED ORDER — TRAMADOL HCL 50 MG PO TABS: ORAL_TABLET | ORAL | 5 refills | Status: DC

## 2019-01-26 MED ORDER — BUDESONIDE-FORMOTEROL FUMARATE 80-4.5 MCG/ACT IN AERO: 2.0000 | INHALATION_SPRAY | Freq: Two times a day (BID) | RESPIRATORY_TRACT | 3 refills | Status: DC

## 2019-01-26 NOTE — Progress Notes (Signed)
Subjective:    Patient ID: Randy [name], male    DOB: July 30, 1928, 85 y.o.   MRN: [id number] In person visit HPI  Pt here today for groin pain. Pt states he has been putting it off until COVID is over.  He states the pain is a sharp pain in the left groin last for a few seconds at a time comes and goes sometimes with movement does not wake him up at night pt states the Pain comes and goes, sharp quick pain. Left side more prevalent.   Pt also having some questions about COPD. Pt is having questions about inhalers. Taking Albuterol and Symbicort.  Review of Systems  Constitutional: Negative for diaphoresis and fatigue.  HENT: Negative for congestion and rhinorrhea.   Respiratory: Negative for cough and shortness of breath.   Cardiovascular: Negative for chest pain and leg swelling.  Gastrointestinal: Negative for abdominal pain and diarrhea.  Skin: Negative for color change and rash.  Neurological: Negative for dizziness and headaches.  Psychiatric/Behavioral: Negative for behavioral problems and confusion.       Objective:   Physical Exam Vitals signs reviewed.  Constitutional:      General: He is not in acute distress. HENT:     Head: Normocephalic and atraumatic.  Eyes:     General:        Right eye: No discharge.        Left eye: No discharge.  Neck:     Trachea: No tracheal deviation.  Cardiovascular:     Rate and Rhythm: Normal rate and regular rhythm.     Heart sounds: Normal heart sounds. No murmur.  Pulmonary:     Effort: Pulmonary effort is normal. No respiratory distress.     Breath sounds: Normal breath sounds.  Lymphadenopathy:     Cervical: No cervical adenopathy.  Skin:    General: Skin is warm and dry.  Neurological:     Mental Status: He is alert.     Coordination: Coordination normal.  Psychiatric:        Behavior: Behavior normal.    Senile purpura       Assessment & Plan:  1. COPD with hypoxia ([place]) His COPD is stable using oxygen is not  tolerating the Symbicort at the high dose we will back it down to 80/4.52 puffs twice daily rinse mouth afterwards lab work ordered - Basic Metabolic Panel (BMET) - Lipid Profile - Magnesium  2. Senile purpura ([facility]) Senile purpura stable on his arms there is no sign of any type of major bleeding issue. - Basic Metabolic Panel (BMET) - Lipid Profile - Magnesium  3. Essential hypertension, benign Blood pressure good control currently continue current measures.  Watch diet closely stay active - Basic Metabolic Panel (BMET) - Lipid Profile - Magnesium  4. GAD (generalized anxiety disorder) Generalized anxiety disorder uses Xanax anywhere between 2 and 4 times per day does not cause drowsiness patient lives by himself  5. Itching Has itching around the neck and face I recommend lotion on a regular basis triamcinolone when necessary for seborrheic dermatitis - Basic Metabolic Panel (BMET) - Lipid Profile - Magnesium  6. Muscle cramps Muscle cramps we will check electrolytes also recommend stretching exercises. - Basic Metabolic Panel (BMET) - Lipid Profile - Magnesium  History mild hyperlipidemia watch diet stay active check lab work  Follow-up office visit 4 months follow-up sooner problems  25 minutes was spent with the patient.  This statement verifies that 25 minutes was indeed spent  with the patient.  More than 50% of this visit-total duration of the visit-was spent in counseling and coordination of care. The issues that the patient came in for today as reflected in the diagnosis (s) please refer to documentation for further details.   Patient has chronic discomforts from osteoarthritis and does benefit from tramadol takes it 4 times daily refills given does not abuse medicine drug registry was checked

## 2019-01-28 DIAGNOSIS — R0902 Hypoxemia: Secondary | ICD-10-CM | POA: Diagnosis not present

## 2019-01-28 DIAGNOSIS — L299 Pruritus, unspecified: Secondary | ICD-10-CM | POA: Diagnosis not present

## 2019-01-28 DIAGNOSIS — J449 Chronic obstructive pulmonary disease, unspecified: Secondary | ICD-10-CM | POA: Diagnosis not present

## 2019-01-28 DIAGNOSIS — I10 Essential (primary) hypertension: Secondary | ICD-10-CM | POA: Diagnosis not present

## 2019-01-28 DIAGNOSIS — R252 Cramp and spasm: Secondary | ICD-10-CM | POA: Diagnosis not present

## 2019-01-28 DIAGNOSIS — D692 Other nonthrombocytopenic purpura: Secondary | ICD-10-CM | POA: Diagnosis not present

## 2019-01-29 LAB — BASIC METABOLIC PANEL
BUN/Creatinine Ratio: 15 (ref 10–24)
BUN: 19 mg/dL (ref 8–27)
CO2: 31 mmol/L — ABNORMAL HIGH (ref 20–29)
Calcium: 9.7 mg/dL (ref 8.6–10.2)
Chloride: 94 mmol/L — ABNORMAL LOW (ref 96–106)
Creatinine, Ser: 1.24 mg/dL (ref 0.76–1.27)
GFR calc Af Amer: 60 mL/min/{1.73_m2} (ref >59)
GFR calc non Af Amer: 52 mL/min/{1.73_m2} — ABNORMAL LOW (ref >59)
Glucose: 113 mg/dL — ABNORMAL HIGH (ref 65–99)
Potassium: 5.1 mmol/L (ref 3.5–5.2)
Sodium: 138 mmol/L (ref 134–144)

## 2019-01-29 LAB — MAGNESIUM: Magnesium: 2.2 mg/dL (ref 1.6–2.3)

## 2019-01-29 LAB — LIPID PANEL
Chol/HDL Ratio: 2.9 ratio (ref 0.0–5.0)
Cholesterol, Total: 166 mg/dL (ref 100–199)
HDL: 57 mg/dL (ref >39)
LDL Calculated: 89 mg/dL (ref 0–99)
Triglycerides: 102 mg/dL (ref 0–149)
VLDL Cholesterol Cal: 20 mg/dL (ref 5–40)

## 2019-01-30 ENCOUNTER — Encounter: Payer: Self-pay | Admitting: Family Medicine

## 2019-02-01 ENCOUNTER — Other Ambulatory Visit: Payer: Self-pay

## 2019-03-02 ENCOUNTER — Other Ambulatory Visit: Payer: Self-pay | Admitting: Family Medicine

## 2019-03-11 ENCOUNTER — Other Ambulatory Visit: Payer: Self-pay | Admitting: Family Medicine

## 2019-03-11 NOTE — Telephone Encounter (Signed)
May have this in 1 refill needs virtual visit in October

## 2019-03-12 NOTE — Telephone Encounter (Signed)
Please schedule and then send back to nurses to send in refill 

## 2019-03-17 NOTE — Telephone Encounter (Signed)
Patient made appointment for 10/12

## 2019-03-23 ENCOUNTER — Ambulatory Visit (INDEPENDENT_AMBULATORY_CARE_PROVIDER_SITE_OTHER): Payer: Medicare Other | Admitting: Family Medicine

## 2019-03-23 ENCOUNTER — Other Ambulatory Visit: Payer: Self-pay

## 2019-03-23 DIAGNOSIS — J441 Chronic obstructive pulmonary disease with (acute) exacerbation: Secondary | ICD-10-CM

## 2019-03-23 MED ORDER — PREDNISONE 20 MG PO TABS: ORAL_TABLET | ORAL | 0 refills | Status: DC

## 2019-03-23 MED ORDER — NEBULIZER/TUBING/MOUTHPIECE KIT: PACK | 0 refills | Status: AC

## 2019-03-23 MED ORDER — AZITHROMYCIN 250 MG PO TABS: ORAL_TABLET | ORAL | 0 refills | Status: DC

## 2019-03-23 MED ORDER — NEBULIZER/TUBING/MOUTHPIECE KIT: PACK | 0 refills | Status: DC

## 2019-03-23 MED ORDER — ALBUTEROL SULFATE (2.5 MG/3ML) 0.083% IN NEBU: INHALATION_SOLUTION | RESPIRATORY_TRACT | 5 refills | Status: DC

## 2019-03-23 NOTE — Progress Notes (Signed)
   Subjective:    Patient ID: Randy [name], male    DOB: [birth year], 85 y.o.   MRN: [id number] Car visit HPI Patient has history of COPD he is having coughing congestion wheezing denies high fever chills sweats denies nausea vomiting no COVID exposure no body aches PMH benign Patient arrives with SOB since yesterday. Patient denies fever body aches chills sweats he just relates what he thinks is a flareup of his COPD coughing and some wheezing. Review of Systems     Objective:   Physical Exam Bilateral wheezes no rales or rhonchi not in respiratory distress heart regular no murmurs extremities no edema skin warm dry oxygen saturation was 95%   15 minutes was spent with patient today discussing healthcare issues which they came.  More than 50% of this visit-total duration of visit-was spent in counseling and coordination of care.  Please see diagnosis regarding the focus of this coordination and care     Assessment & Plan:  COPD exacerbation O2 saturation on current oxygen is good Prednisone taper Antibiotic Albuterol every 2-4 hours as needed Follow-up if progressive troubles If worse over the next 2 to 3 days immediately go to ER I doubt COVID

## 2019-04-07 ENCOUNTER — Telehealth: Payer: Self-pay | Admitting: Family Medicine

## 2019-04-07 MED ORDER — ALBUTEROL SULFATE (2.5 MG/3ML) 0.083% IN NEBU: INHALATION_SOLUTION | RESPIRATORY_TRACT | 5 refills | Status: DC

## 2019-04-07 NOTE — Telephone Encounter (Signed)
Pt finished the antibioti & steroids  Wonders if he needs to get meds refilled for the nebulizer?  Should this be an going thing?  Please advise & call

## 2019-04-07 NOTE — Telephone Encounter (Signed)
It would be fine to do 4 refills of the nebulizer solution It is important to not use a albuterol inhaler and nebulizer because they are the same type of medicine Essentially he is using the nebulizer.use the albuterol inhaler

## 2019-04-07 NOTE — Telephone Encounter (Signed)
Daughter (DPR) states that she feels like the patient is doing much better with the nebs

## 2019-04-07 NOTE — Telephone Encounter (Signed)
Neb solution refills sent in and pt is aware. Pt verbalized understanding

## 2019-04-11 ENCOUNTER — Other Ambulatory Visit: Payer: Self-pay

## 2019-04-12 ENCOUNTER — Ambulatory Visit (INDEPENDENT_AMBULATORY_CARE_PROVIDER_SITE_OTHER): Payer: Medicare Other | Admitting: Family Medicine

## 2019-04-12 DIAGNOSIS — I712 Thoracic aortic aneurysm, without rupture, unspecified: Secondary | ICD-10-CM

## 2019-04-12 DIAGNOSIS — J449 Chronic obstructive pulmonary disease, unspecified: Secondary | ICD-10-CM

## 2019-04-12 DIAGNOSIS — R0902 Hypoxemia: Secondary | ICD-10-CM | POA: Diagnosis not present

## 2019-04-12 NOTE — Progress Notes (Signed)
   Subjective:    Patient ID: Randy [name], male    DOB: [birth year], 85 y.o.   MRN: [id number]  HPI Pt having a medication refill and discussion regarding having a CT angio of aorta. Pt states he is doing well on his nebulizer treatments.  On previous years the patient has had CT scan certainly this is movement like to look at again upon discussion with the patient and his daughter given that aneurysm has held stable given the current pandemic situation we will hold off on doing the follow-up until 6 months from now  His COPD is stable and he is using the Symbicort twice daily he is using the albuterol as needed and using a nebulizer as needed he will follow up if any ongoing troubles  He will do his flu shot in the near future Virtual Visit via Telephone Note  I connected with Randy [name] on 04/12/19 at  1:10 PM EDT by telephone and verified that I am speaking with the correct person using two identifiers.  Location: Patient: home Provider: office   I discussed the limitations, risks, security and privacy concerns of performing an evaluation and management service by telephone and the availability of in person appointments. I also discussed with the patient that there may be a patient responsible charge related to this service. The patient expressed understanding and agreed to proceed.   History of Present Illness:    Observations/Objective:   Assessment and Plan:   Follow Up Instructions:    I discussed the assessment and treatment plan with the patient. The patient was provided an opportunity to ask questions and all were answered. The patient agreed with the plan and demonstrated an understanding of the instructions.   The patient was advised to call back or seek an in-person evaluation if the symptoms worsen or if the condition fails to improve as anticipated.  I provided 15 minutes of non-face-to-face time during this encounter.   Vicente Males, LPN    Review of  Systems  Constitutional: Negative for activity change, chills and fever.  HENT: Negative for congestion, ear pain and rhinorrhea.   Eyes: Negative for discharge.  Respiratory: Negative for cough and wheezing.   Cardiovascular: Negative for chest pain.  Gastrointestinal: Negative for nausea and vomiting.  Musculoskeletal: Negative for arthralgias.       Objective:   Physical Exam Today's visit was via telephone Physical exam was not possible for this visit   O2 sat with oxygen staying around 94     Assessment & Plan:  Thoracic aortic aneurysm will really get a CAT scan in 6 months  COPD stable continue current measures  Flu shot at pharmacy in the near future

## 2019-04-17 ENCOUNTER — Other Ambulatory Visit: Payer: Self-pay | Admitting: Family Medicine

## 2019-04-20 NOTE — Telephone Encounter (Signed)
Duplicate

## 2019-04-26 ENCOUNTER — Telehealth: Payer: Self-pay | Admitting: Family Medicine

## 2019-04-26 NOTE — Telephone Encounter (Signed)
I would recommend trying Bactroban ointment apply thin amount on a daily basis over the course of the next 10 days It would also be reasonable to use a small Band-Aid May need to cut back some of the adhesive Use it on this area to prevent it from rubbing  If he has ongoing problems with getting irritated, bleeding, or if the area does not look right then yes indeed I would recommend office visit to have it looked at  This could be done in the office for as a car visit what ever they prefer

## 2019-04-26 NOTE — Telephone Encounter (Signed)
Daughter was concerned about an area on his ear that she thinks gets rubbed by his oxygen tubing.  Patient said it was bleeding a lot, running down his neck, but has now stopped.  Daughter put a bandaid on it.  She didn't know if we needed to seem him for this?

## 2019-04-26 NOTE — Telephone Encounter (Signed)
Patient state he will hold on cream and appt. Patient states he has some cream from a previous visit and the area is doing just fine now and will call us back if any further problems.

## 2019-05-10 ENCOUNTER — Other Ambulatory Visit: Payer: Self-pay | Admitting: Family Medicine

## 2019-05-13 ENCOUNTER — Other Ambulatory Visit: Payer: Self-pay | Admitting: *Deleted

## 2019-05-13 DIAGNOSIS — I712 Thoracic aortic aneurysm, without rupture, unspecified: Secondary | ICD-10-CM

## 2019-05-29 ENCOUNTER — Other Ambulatory Visit: Payer: Self-pay | Admitting: Family Medicine

## 2019-06-03 ENCOUNTER — Ambulatory Visit (HOSPITAL_COMMUNITY)
Admission: RE | Admit: 2019-06-03 | Discharge: 2019-06-03 | Disposition: A | Discharge status: Home / Self Care | Payer: Medicare Other | Source: Ambulatory Visit | Attending: Family Medicine | Admitting: Family Medicine

## 2019-06-03 ENCOUNTER — Other Ambulatory Visit: Payer: Self-pay

## 2019-06-03 DIAGNOSIS — I712 Thoracic aortic aneurysm, without rupture: Secondary | ICD-10-CM | POA: Diagnosis not present

## 2019-06-03 LAB — POCT I-STAT CREATININE: Creatinine, Ser: 1.3 mg/dL — ABNORMAL HIGH (ref 0.61–1.24)

## 2019-06-03 MED ORDER — IOHEXOL 350 MG/ML SOLN
100.0000 mL | Freq: Once | INTRAVENOUS | Status: AC | PRN
  Administered 2019-06-03: 16:00:00 100 mL via INTRAVENOUS

## 2019-06-05 ENCOUNTER — Encounter: Payer: Self-pay | Admitting: Family Medicine

## 2019-06-08 ENCOUNTER — Other Ambulatory Visit: Payer: Self-pay | Admitting: Family Medicine

## 2019-06-28 ENCOUNTER — Other Ambulatory Visit: Payer: Self-pay | Admitting: Family Medicine

## 2019-07-08 IMAGING — CT CT CHEST W/O CM
2 of 3 series · 14 of 36 positions shown, 17 images · IV contrast (iopamidol)
Comparison: Chest CT 01/12/2016; CT abdomen pelvis 12/19/2011.

CLINICAL DATA: Follow-up pulmonary nodules.  Lower abdominal pain.

EXAM:
CT CHEST WITHOUT CONTRAST
CT ABDOMEN AND PELVIS WITH CONTRAST
TECHNIQUE: Multidetector CT imaging of the chest was performed following the
standard protocol without intravenous contrast
Multidetector CT imaging of the abdomen and pelvis was performed
following the standard protocol during bolus administration of
intravenous contrast.
CONTRAST:  100mL 9KV62A-HSS IOPAMIDOL (9KV62A-HSS) INJECTION 61%

[Series 2: thorax · axial · 0.69mm/px · z∈[-37,+207]mm · 11 of 144 slices shown, 14 images]
[im 11/144  mediastinal]
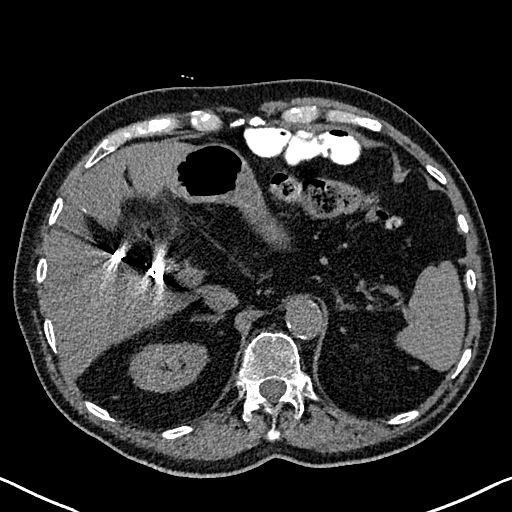
[im 11/144  lung]
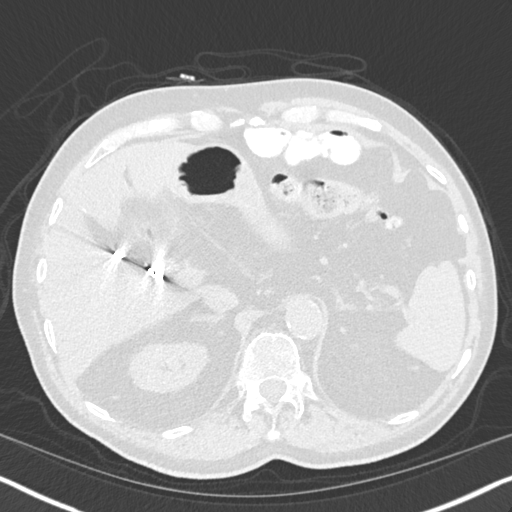
[im 22/144  lung]
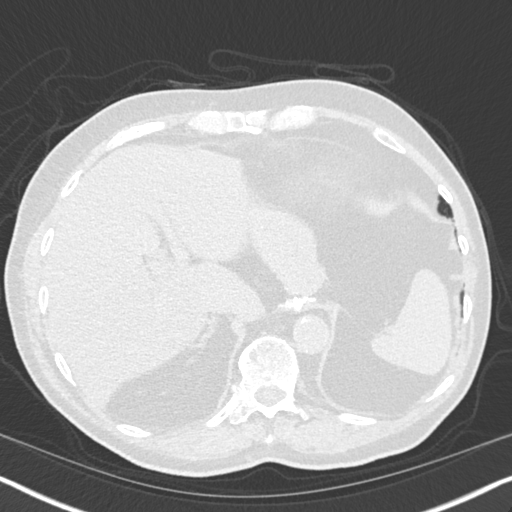
[im 32/144  lung]
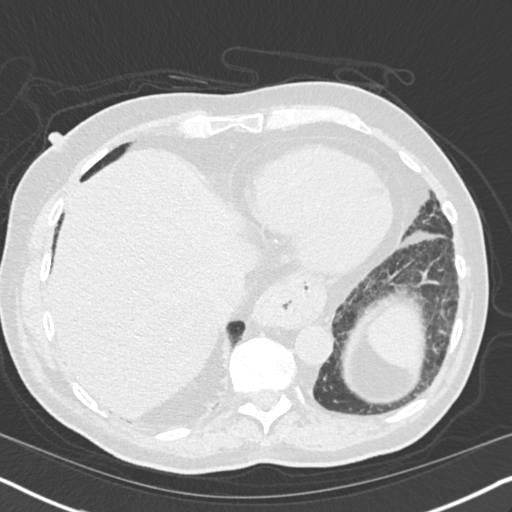
[im 48/144  lung]
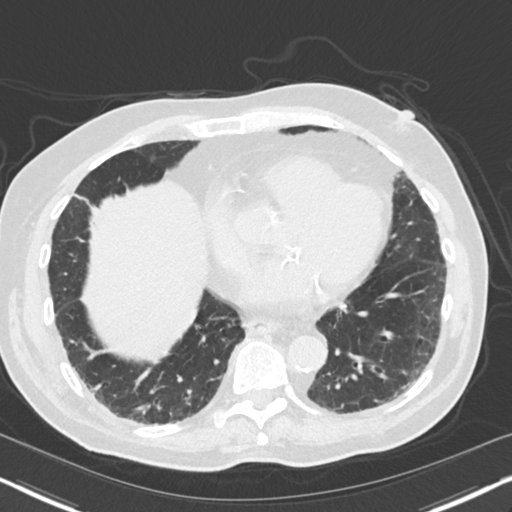
[im 59/144  mediastinal]
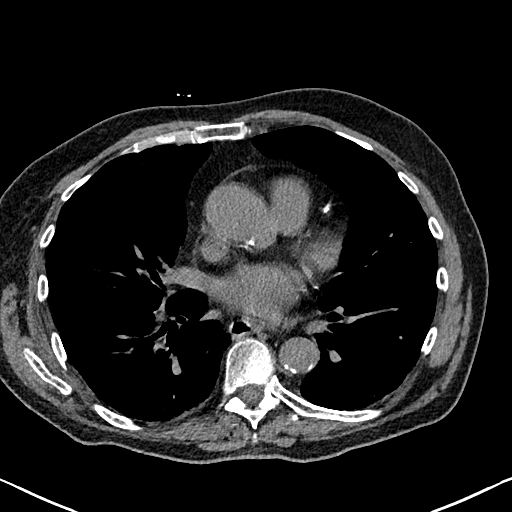
[im 59/144  lung]
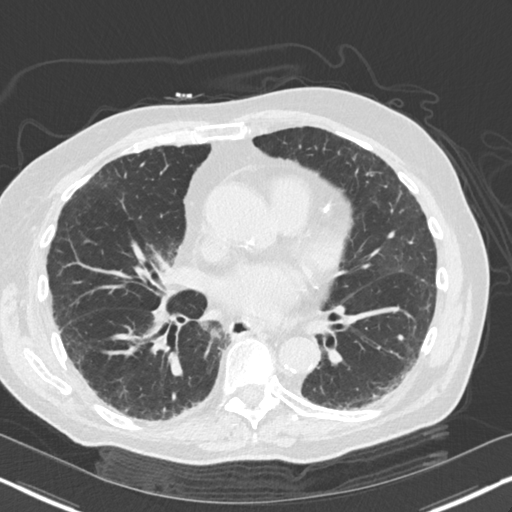
[im 75/144  lung]
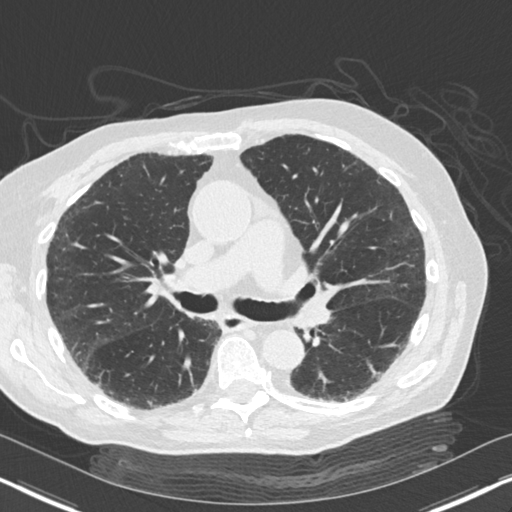
[im 85/144  lung]
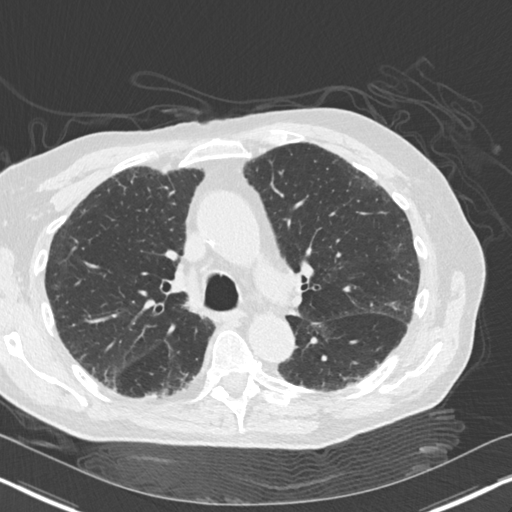
[im 96/144  lung]
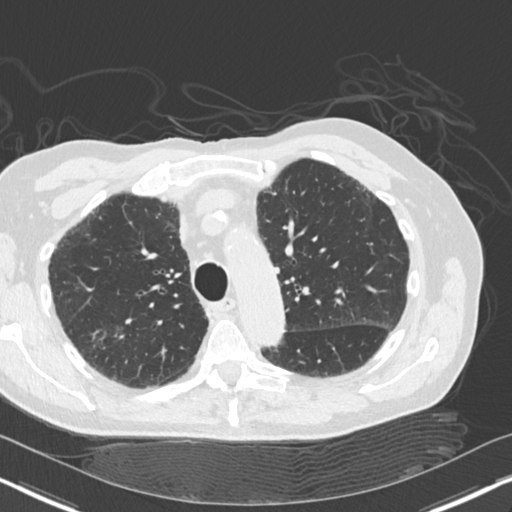
[im 112/144  mediastinal]
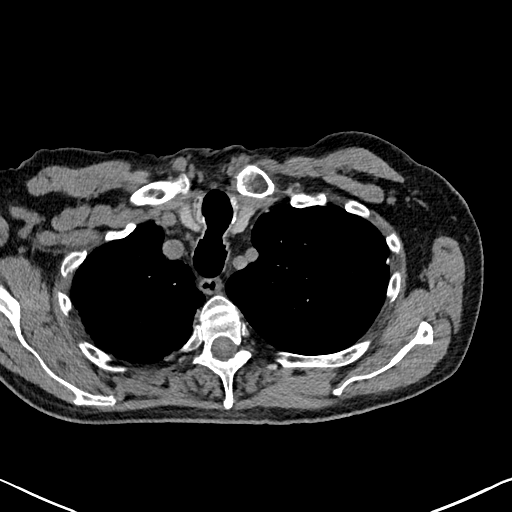
[im 112/144  lung]
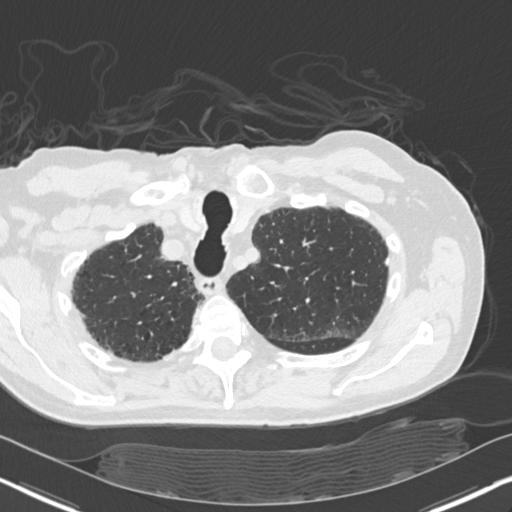
[im 122/144  lung]
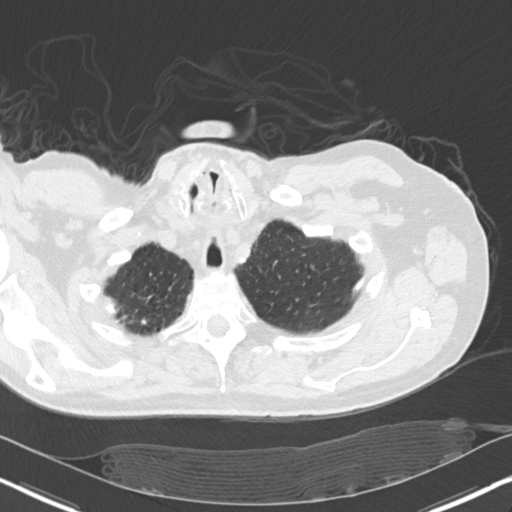
[im 133/144  lung]
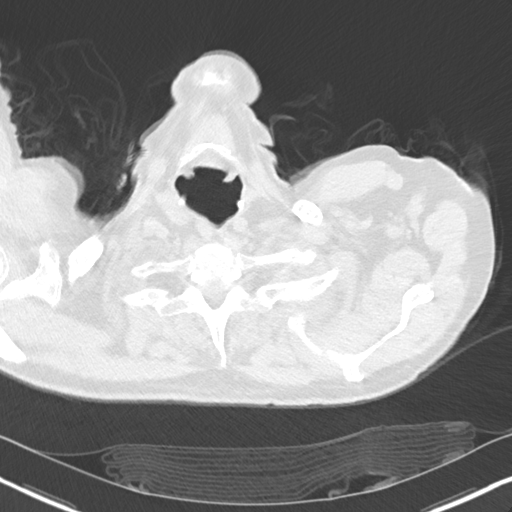

[Series 5: coronal · coronal · 0.62mm/px · 3 of 127 slices shown]
[im 26/127  lung]
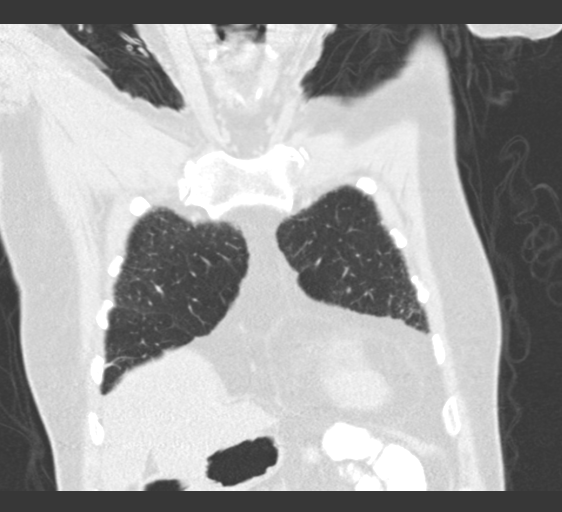
[im 51/127  lung]
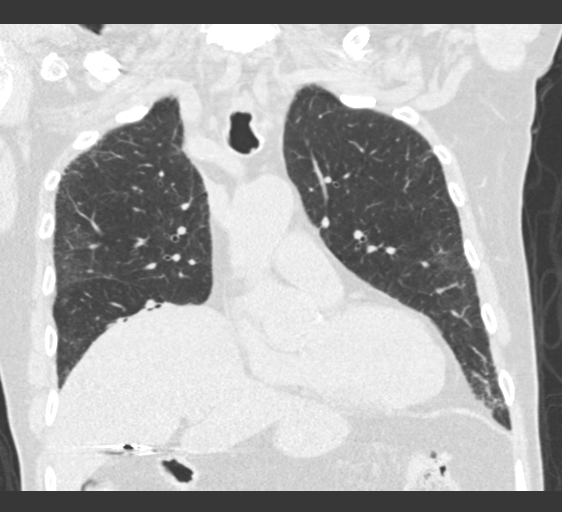
[im 76/127  lung]
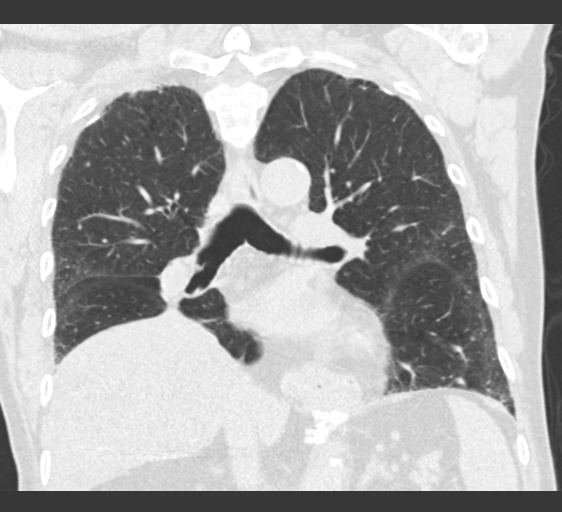

[14 of 36 positions shown; findings below may reference images not displayed]

FINDINGS: FINDINGS
CT CHEST FINDINGS

Cardiovascular: Normal heart size. No pericardial effusion.
Ascending thoracic aorta measures 4.3 cm, unchanged. Main pulmonary
artery normal in caliber. Coronary arterial vascular calcifications.

Mediastinum/Nodes: No enlarged axillary, mediastinal or hilar
lymphadenopathy. Small hiatal hernia.

Lungs/Pleura: Central airways are patent. Similar appearing
bilateral predominately subpleural reticular opacities. Unchanged 4
mm right middle lobe nodule (image 95; series 4). No pleural
effusion or pneumothorax.

Musculoskeletal: No aggressive or acute appearing osseous lesions.

CT ABDOMEN PELVIS FINDINGS

Hepatobiliary: The liver is diffusely low in attenuation. No focal
hepatic lesion is identified. Patient status post cholecystectomy.

Pancreas: Mild dilatation of the main pancreatic duct measuring up
to 7 mm within the pancreatic head. No ductal dilatation involving
the pancreatic body and tail. No surrounding inflammatory change.

Spleen: Unremarkable

Adrenals/Urinary Tract: Adrenal glands are normal. Kidneys enhance
symmetrically with contrast. No hydronephrosis. Urinary bladder is
nondistended. The right aspect of the urinary bladder dome is
herniated into the right inguinal hernia (image 84; series 2).

Stomach/Bowel: Sigmoid colonic diverticulosis. No CT evidence for
acute diverticulitis. No abnormal bowel wall thickening or evidence
for bowel obstruction. No free fluid or free intraperitoneal air.

Vascular/Lymphatic: Infrarenal abdominal aortic ectasia measuring
2.5 cm. No retroperitoneal lymphadenopathy.

Reproductive: Prostate is unremarkable.

Other: Bilateral fat containing inguinal hernias. Additionally the
dome of the urinary bladder is herniated into the right inguinal
hernia.

Musculoskeletal: No acute or significant osseous findings.
IMPRESSION: The right aspect of the urinary bladder dome is herniated into a
right inguinal hernia.

Sigmoid colonic diverticulosis without CT evidence for acute
diverticulitis.

Aortic atherosclerosis and infrarenal abdominal aortic ectasia.

Stable 4 mm right middle lobe pulmonary nodule, most compatible with
benign etiology.

Hepatic steatosis.

Ascending thoracic aorta measures 4.3 cm. Recommend annual imaging
followup by CTA or MRA. This recommendation follows 6909
ACCF/AHA/AATS/ACR/ASA/SCA/MARLENE GRISELDA/[address]/LENCHY/NEPGEN Guidelines for the
Diagnosis and Management of Patients with Thoracic Aortic Disease.
Circulation. 6909; 121: [id number]

## 2019-07-28 ENCOUNTER — Other Ambulatory Visit: Payer: Self-pay | Admitting: Family Medicine

## 2019-08-04 ENCOUNTER — Other Ambulatory Visit: Payer: Self-pay | Admitting: Family Medicine

## 2019-08-04 NOTE — Telephone Encounter (Signed)
Please schedule and then route back to nurses to send in refill °

## 2019-08-04 NOTE — Telephone Encounter (Signed)
May have this +1 refill please pend Needs follow-up visit or virtual visit this spring

## 2019-08-04 NOTE — Telephone Encounter (Signed)
Scheduled 4/5

## 2019-08-07 ENCOUNTER — Other Ambulatory Visit: Payer: Self-pay | Admitting: Family Medicine

## 2019-08-23 DIAGNOSIS — Z23 Encounter for immunization: Secondary | ICD-10-CM | POA: Diagnosis not present

## 2019-09-08 DIAGNOSIS — H33001 Unspecified retinal detachment with retinal break, right eye: Secondary | ICD-10-CM | POA: Diagnosis not present

## 2019-09-20 DIAGNOSIS — Z23 Encounter for immunization: Secondary | ICD-10-CM | POA: Diagnosis not present

## 2019-10-04 ENCOUNTER — Other Ambulatory Visit: Payer: Self-pay

## 2019-10-04 ENCOUNTER — Ambulatory Visit (INDEPENDENT_AMBULATORY_CARE_PROVIDER_SITE_OTHER): Payer: Medicare Other | Admitting: Family Medicine

## 2019-10-04 DIAGNOSIS — I10 Essential (primary) hypertension: Secondary | ICD-10-CM

## 2019-10-04 DIAGNOSIS — R3916 Straining to void: Secondary | ICD-10-CM | POA: Diagnosis not present

## 2019-10-04 DIAGNOSIS — N401 Enlarged prostate with lower urinary tract symptoms: Secondary | ICD-10-CM

## 2019-10-04 DIAGNOSIS — Z13220 Encounter for screening for lipoid disorders: Secondary | ICD-10-CM

## 2019-10-04 DIAGNOSIS — J431 Panlobular emphysema: Secondary | ICD-10-CM | POA: Diagnosis not present

## 2019-10-04 DIAGNOSIS — G8929 Other chronic pain: Secondary | ICD-10-CM

## 2019-10-04 MED ORDER — ALBUTEROL SULFATE (2.5 MG/3ML) 0.083% IN NEBU: INHALATION_SOLUTION | RESPIRATORY_TRACT | 11 refills | Status: DC

## 2019-10-04 MED ORDER — METOPROLOL SUCCINATE ER 25 MG PO TB24: ORAL_TABLET | ORAL | 1 refills | Status: DC

## 2019-10-04 MED ORDER — ALPRAZOLAM 0.5 MG PO TABS: ORAL_TABLET | ORAL | 5 refills | Status: DC

## 2019-10-04 MED ORDER — BUDESONIDE-FORMOTEROL FUMARATE 80-4.5 MCG/ACT IN AERO: INHALATION_SPRAY | RESPIRATORY_TRACT | 11 refills | Status: AC

## 2019-10-04 MED ORDER — TAMSULOSIN HCL 0.4 MG PO CAPS: 0.8000 mg | ORAL_CAPSULE | Freq: Every day | ORAL | 1 refills | Status: DC

## 2019-10-04 MED ORDER — ALBUTEROL SULFATE HFA 108 (90 BASE) MCG/ACT IN AERS: INHALATION_SPRAY | RESPIRATORY_TRACT | 11 refills | Status: DC

## 2019-10-04 MED ORDER — FAMOTIDINE 20 MG PO TABS: 20.0000 mg | ORAL_TABLET | Freq: Every day | ORAL | 1 refills | Status: AC | PRN

## 2019-10-04 MED ORDER — TRAMADOL HCL 50 MG PO TABS: ORAL_TABLET | ORAL | 5 refills | Status: DC

## 2019-10-04 MED ORDER — CEPHALEXIN 500 MG PO CAPS: ORAL_CAPSULE | ORAL | 0 refills | Status: DC

## 2019-10-04 NOTE — Progress Notes (Signed)
   Subjective:    Patient ID: Randy [name], male    DOB: [birth year], 86 y.o.   MRN: [id number]  HPI Pt here today for med check. Pt is taking metoprolol for blood pressure, tamsulosin at night, and tramadol prn pain. Pt is also taking albuterol inhaler and neb treatments. Pt would like 90 day supply on medications.   Pt has been having some lower abdominal pain and lower back pain. Pt states that when he takes his meds and uses the bathroom, that relieves the pain. Pt aide started patient on cranberry juice to try to help kidney infection.  Patient does relate some dysuria urinary frequency denies high fever chills sweats prefers not to come to the office currently to lessen the risk of Covid exposure Virtual Visit via Telephone Note  I connected with Jeremi Ramp on 10/04/19 at 10:30 AM EDT by telephone and verified that I am speaking with the correct person using two identifiers.  Location: Patient: home Provider: office   I discussed the limitations, risks, security and privacy concerns of performing an evaluation and management service by telephone and the availability of in person appointments. I also discussed with the patient that there may be a patient responsible charge related to this service. The patient expressed understanding and agreed to proceed.   History of Present Illness:    Observations/Objective:   Assessment and Plan:   Follow Up Instructions:    I discussed the assessment and treatment plan with the patient. The patient was provided an opportunity to ask questions and all were answered. The patient agreed with the plan and demonstrated an understanding of the instructions.   The patient was advised to call back or seek an in-person evaluation if the symptoms worsen or if the condition fails to improve as anticipated.  I provided 20 minutes of non-face-to-face time during this encounter.       Review of Systems  Constitutional: Negative for activity  change, fatigue and fever.  HENT: Negative for congestion and rhinorrhea.   Respiratory: Negative for cough and shortness of breath.   Cardiovascular: Negative for chest pain and leg swelling.  Gastrointestinal: Negative for abdominal pain, diarrhea and nausea.  Genitourinary: Negative for dysuria and hematuria.  Neurological: Negative for weakness and headaches.  Psychiatric/Behavioral: Negative for agitation and behavioral problems.       Objective:   Physical Exam   Today's visit was via telephone Physical exam was not possible for this visit      Assessment & Plan:  1. Panlobular emphysema ([place]) Patient does the best he can does use his inhaler denies any significant shortness of breath - Lipid Profile - Basic Metabolic Panel (BMET)  2. Essential hypertension, benign Reportedly under good control takes his medication - Lipid Profile - Basic Metabolic Panel (BMET)  3. Benign prostatic hyperplasia (BPH) with straining on urination Urinary flow okay currently.  Takes his medication - Lipid Profile - Basic Metabolic Panel (BMET)  4. Other chronic pain Uses tramadol for this does not abuse it - Lipid Profile - Basic Metabolic Panel (BMET)  5. Screening, lipid Screening lipid indicated - Lipid Profile - Basic Metabolic Panel (BMET)   Probable urinary tract infection treat with Keflex if ongoing trouble needs office visit

## 2019-10-14 DIAGNOSIS — Z13220 Encounter for screening for lipoid disorders: Secondary | ICD-10-CM | POA: Diagnosis not present

## 2019-10-14 DIAGNOSIS — G8929 Other chronic pain: Secondary | ICD-10-CM | POA: Diagnosis not present

## 2019-10-14 DIAGNOSIS — R3916 Straining to void: Secondary | ICD-10-CM | POA: Diagnosis not present

## 2019-10-14 DIAGNOSIS — N401 Enlarged prostate with lower urinary tract symptoms: Secondary | ICD-10-CM | POA: Diagnosis not present

## 2019-10-14 DIAGNOSIS — I10 Essential (primary) hypertension: Secondary | ICD-10-CM | POA: Diagnosis not present

## 2019-10-14 DIAGNOSIS — J431 Panlobular emphysema: Secondary | ICD-10-CM | POA: Diagnosis not present

## 2019-10-15 LAB — LIPID PANEL
Chol/HDL Ratio: 2.9 ratio (ref 0.0–5.0)
Cholesterol, Total: 152 mg/dL (ref 100–199)
HDL: 52 mg/dL (ref >39)
LDL Chol Calc (NIH): 81 mg/dL (ref 0–99)
Triglycerides: 101 mg/dL (ref 0–149)
VLDL Cholesterol Cal: 19 mg/dL (ref 5–40)

## 2019-10-15 LAB — BASIC METABOLIC PANEL
BUN/Creatinine Ratio: 17 (ref 10–24)
BUN: 19 mg/dL (ref 8–27)
CO2: 31 mmol/L — ABNORMAL HIGH (ref 20–29)
Calcium: 9 mg/dL (ref 8.6–10.2)
Chloride: 97 mmol/L (ref 96–106)
Creatinine, Ser: 1.1 mg/dL (ref 0.76–1.27)
GFR calc Af Amer: 68 mL/min/{1.73_m2} (ref >59)
GFR calc non Af Amer: 59 mL/min/{1.73_m2} — ABNORMAL LOW (ref >59)
Glucose: 116 mg/dL — ABNORMAL HIGH (ref 65–99)
Potassium: 4.5 mmol/L (ref 3.5–5.2)
Sodium: 141 mmol/L (ref 134–144)

## 2019-10-17 ENCOUNTER — Encounter: Payer: Self-pay | Admitting: Family Medicine

## 2020-01-20 ENCOUNTER — Telehealth: Payer: Self-pay | Admitting: Family Medicine

## 2020-01-20 NOTE — Telephone Encounter (Signed)
Please talk with the daughter I have openings the first week of August that you could see him but if they feel he needs to please be seen next week I am only here on Thursday and Friday of next week because I am taking some days off If they feel he needs to be seen next week Thursday or Friday if he needs to be seen sooner ER urgent care or Dr. Lovena Le if she has openings

## 2020-01-20 NOTE — Telephone Encounter (Signed)
Daughter states he is not in distress but getting more sob with activity than usual and going from using his rescue inhaler 2 times a month to about 2 -3 times a week. Daughter states she just does not want to wait til September. He did call her yesterday and tell her he was having an epidsode and she went over. States he has not been doing the neb machine just the inhaler. Advised if he is in distress or struggling to breath he should go to ED. She verbalized understanding and just wants appt before September.

## 2020-01-20 NOTE — Telephone Encounter (Signed)
Patient is having issues with his COPD and daughter Manuela Schwartz is requesting to go over his medication with him. Please review schedule to see where we can fit him in.[phone]

## 2020-01-21 NOTE — Telephone Encounter (Signed)
Scheduled appointment for August 3 at 2:30

## 2020-02-01 ENCOUNTER — Ambulatory Visit (INDEPENDENT_AMBULATORY_CARE_PROVIDER_SITE_OTHER): Payer: Medicare Other | Admitting: Family Medicine

## 2020-02-01 ENCOUNTER — Encounter: Payer: Self-pay | Admitting: Family Medicine

## 2020-02-01 ENCOUNTER — Other Ambulatory Visit: Payer: Self-pay

## 2020-02-01 VITALS — BP 136/88 | HR 106 | Temp 97.5°F | Wt 163.6 lb

## 2020-02-01 DIAGNOSIS — J449 Chronic obstructive pulmonary disease, unspecified: Secondary | ICD-10-CM

## 2020-02-01 DIAGNOSIS — R5383 Other fatigue: Secondary | ICD-10-CM | POA: Diagnosis not present

## 2020-02-01 DIAGNOSIS — F411 Generalized anxiety disorder: Secondary | ICD-10-CM

## 2020-02-01 DIAGNOSIS — R0600 Dyspnea, unspecified: Secondary | ICD-10-CM | POA: Diagnosis not present

## 2020-02-01 DIAGNOSIS — R002 Palpitations: Secondary | ICD-10-CM

## 2020-02-01 DIAGNOSIS — R3916 Straining to void: Secondary | ICD-10-CM

## 2020-02-01 DIAGNOSIS — N401 Enlarged prostate with lower urinary tract symptoms: Secondary | ICD-10-CM | POA: Diagnosis not present

## 2020-02-01 DIAGNOSIS — D692 Other nonthrombocytopenic purpura: Secondary | ICD-10-CM

## 2020-02-01 DIAGNOSIS — R0902 Hypoxemia: Secondary | ICD-10-CM | POA: Diagnosis not present

## 2020-02-01 DIAGNOSIS — I10 Essential (primary) hypertension: Secondary | ICD-10-CM

## 2020-02-01 DIAGNOSIS — R0609 Other forms of dyspnea: Secondary | ICD-10-CM

## 2020-02-01 DIAGNOSIS — R519 Headache, unspecified: Secondary | ICD-10-CM

## 2020-02-01 MED ORDER — METOPROLOL SUCCINATE ER 25 MG PO TB24: ORAL_TABLET | ORAL | 1 refills | Status: AC

## 2020-02-01 MED ORDER — ALPRAZOLAM 0.5 MG PO TABS: ORAL_TABLET | ORAL | 5 refills | Status: DC

## 2020-02-01 MED ORDER — TRAMADOL HCL 50 MG PO TABS: ORAL_TABLET | ORAL | 5 refills | Status: DC

## 2020-02-01 MED ORDER — TAMSULOSIN HCL 0.4 MG PO CAPS: 0.8000 mg | ORAL_CAPSULE | Freq: Every day | ORAL | 1 refills | Status: AC

## 2020-02-01 MED ORDER — ALBUTEROL SULFATE (2.5 MG/3ML) 0.083% IN NEBU: INHALATION_SOLUTION | RESPIRATORY_TRACT | 11 refills | Status: DC

## 2020-02-01 MED ORDER — ALBUTEROL SULFATE HFA 108 (90 BASE) MCG/ACT IN AERS: INHALATION_SPRAY | RESPIRATORY_TRACT | 11 refills | Status: AC

## 2020-02-01 NOTE — Progress Notes (Signed)
Subjective:    Patient ID: Randy [name], male    DOB: 05-23-1929, 86 y.o.   MRN: [id number]  HPI Patient comes in today to discussed increased issues with COPD. Patient complains of increased SOB, no energy and all is worse with any activity. Does not feel like doing anything at all at least half of the day everyday.   Patient reports continued itching on the right side if his face and head. Triamcinolone cream helps but doesn't cure this, would like a refill on the cream.   Complains of recent headaches occurring about 2 x per week with sharp pain but quickly resolves.  Questions regarding inhalers and nebulizer. Not using the nebulizer often and would like to know if he can increase use on his symbicort inhaler or if there's something else he needs.   Requesting refill on tramadol, xanax, metoprolol and triamcinolone cream Review of Systems  Constitutional: Negative for diaphoresis and fatigue.  HENT: Negative for congestion and rhinorrhea.   Respiratory: Positive for shortness of breath and wheezing. Negative for cough.   Cardiovascular: Negative for chest pain and leg swelling.  Gastrointestinal: Negative for abdominal pain and diarrhea.  Skin: Negative for color change and rash.  Neurological: Negative for dizziness and headaches.  Psychiatric/Behavioral: Negative for behavioral problems and confusion.       Objective:   Physical Exam Vitals reviewed.  Constitutional:      General: He is not in acute distress. HENT:     Head: Normocephalic and atraumatic.  Eyes:     General:        Right eye: No discharge.        Left eye: No discharge.  Neck:     Trachea: No tracheal deviation.  Cardiovascular:     Rate and Rhythm: Normal rate and regular rhythm.     Heart sounds: Normal heart sounds. No murmur heard.      Comments: Palpitations are noted Pulmonary:     Effort: Pulmonary effort is normal. No respiratory distress.     Breath sounds: Normal breath sounds.    Lymphadenopathy:     Cervical: No cervical adenopathy.  Skin:    General: Skin is warm and dry.  Neurological:     Mental Status: He is alert.     Coordination: Coordination normal.  Psychiatric:        Behavior: Behavior normal.           Assessment & Plan:  1. Essential hypertension, benign Blood pressure decent control refills given check kidney function - Basic metabolic panel  2. Senile purpura (HCC) Bruising noted on the arms stable.  3. COPD with hypoxia ([place]) Significant COPD and related issues.  Very important for the patient to use the inhaler on a regular basis does not tolerate Spiriva because it caused problems with his urination We did talk about allowing more frequent use of albuterol  - Sedimentation rate - C-reactive protein  4. Benign prostatic hyperplasia (BPH) with straining on urination Continue Flomax as directed  5. Palpitations EKG ordered await the results EKG no acute changes normal sinus rhythm reassuring  6. GAD (generalized anxiety disorder) Uses Xanax on a frequent basis but it allows him to tolerate his severe COPD therefore we will continue this currently  7. DOE (dyspnea on exertion) Significant DOE related to his COPD. - Sedimentation rate - C-reactive protein - PR ELECTROCARDIOGRAM, COMPLETE  8. Other fatigue Fatigue and tiredness check lab work - CBC with Differential/Platelet - Magnesium - TSH -  T4, free - C-reactive protein Sharp headaches will check sed rate  I find no evidence of atrial fibrillation neck irregularity of his heart when I listen to it was related to palpitations  Follow-up patient 3 months

## 2020-02-02 LAB — CBC WITH DIFFERENTIAL/PLATELET
Basophils Absolute: 0 10*3/uL (ref 0.0–0.2)
Basos: 0 %
EOS (ABSOLUTE): 0.2 10*3/uL (ref 0.0–0.4)
Eos: 2 %
Hematocrit: 42.3 % (ref 37.5–51.0)
Hemoglobin: 14.3 g/dL (ref 13.0–17.7)
Immature Grans (Abs): 0.1 10*3/uL (ref 0.0–0.1)
Immature Granulocytes: 1 %
Lymphocytes Absolute: 1.2 10*3/uL (ref 0.7–3.1)
Lymphs: 16 %
MCH: 30.6 pg (ref 26.6–33.0)
MCHC: 33.8 g/dL (ref 31.5–35.7)
MCV: 90 fL (ref 79–97)
Monocytes Absolute: 0.7 10*3/uL (ref 0.1–0.9)
Monocytes: 10 %
Neutrophils Absolute: 5.5 10*3/uL (ref 1.4–7.0)
Neutrophils: 71 %
Platelets: 198 10*3/uL (ref 150–450)
RBC: 4.68 x10E6/uL (ref 4.14–5.80)
RDW: 12.4 % (ref 11.6–15.4)
WBC: 7.7 10*3/uL (ref 3.4–10.8)

## 2020-02-02 LAB — BASIC METABOLIC PANEL
BUN/Creatinine Ratio: 17 (ref 10–24)
BUN: 22 mg/dL (ref 8–27)
CO2: 31 mmol/L — ABNORMAL HIGH (ref 20–29)
Calcium: 9.3 mg/dL (ref 8.6–10.2)
Chloride: 101 mmol/L (ref 96–106)
Creatinine, Ser: 1.27 mg/dL (ref 0.76–1.27)
GFR calc Af Amer: 58 mL/min/{1.73_m2} — ABNORMAL LOW (ref >59)
GFR calc non Af Amer: 50 mL/min/{1.73_m2} — ABNORMAL LOW (ref >59)
Glucose: 100 mg/dL — ABNORMAL HIGH (ref 65–99)
Potassium: 5 mmol/L (ref 3.5–5.2)
Sodium: 145 mmol/L — ABNORMAL HIGH (ref 134–144)

## 2020-02-02 LAB — C-REACTIVE PROTEIN: CRP: 9 mg/L (ref 0–10)

## 2020-02-02 LAB — SEDIMENTATION RATE: Sed Rate: 17 mm/hr (ref 0–30)

## 2020-02-02 LAB — T4, FREE: Free T4: 1.37 ng/dL (ref 0.82–1.77)

## 2020-02-02 LAB — MAGNESIUM: Magnesium: 2.1 mg/dL (ref 1.6–2.3)

## 2020-02-02 LAB — TSH: TSH: 0.695 u[IU]/mL (ref 0.450–4.500)

## 2020-02-04 ENCOUNTER — Encounter: Payer: Self-pay | Admitting: Family Medicine

## 2020-03-28 ENCOUNTER — Other Ambulatory Visit: Payer: Self-pay | Admitting: Family Medicine

## 2020-03-29 DIAGNOSIS — J449 Chronic obstructive pulmonary disease, unspecified: Secondary | ICD-10-CM | POA: Diagnosis not present

## 2020-03-29 DIAGNOSIS — R531 Weakness: Secondary | ICD-10-CM | POA: Diagnosis not present

## 2020-03-29 DIAGNOSIS — I10 Essential (primary) hypertension: Secondary | ICD-10-CM | POA: Diagnosis not present

## 2020-03-29 DIAGNOSIS — R52 Pain, unspecified: Secondary | ICD-10-CM | POA: Diagnosis not present

## 2020-03-31 DIAGNOSIS — J449 Chronic obstructive pulmonary disease, unspecified: Secondary | ICD-10-CM | POA: Diagnosis not present

## 2020-03-31 DIAGNOSIS — R531 Weakness: Secondary | ICD-10-CM | POA: Diagnosis not present

## 2020-03-31 DIAGNOSIS — R52 Pain, unspecified: Secondary | ICD-10-CM | POA: Diagnosis not present

## 2020-03-31 DIAGNOSIS — I10 Essential (primary) hypertension: Secondary | ICD-10-CM | POA: Diagnosis not present

## 2020-04-03 DIAGNOSIS — I10 Essential (primary) hypertension: Secondary | ICD-10-CM | POA: Diagnosis not present

## 2020-04-03 DIAGNOSIS — R531 Weakness: Secondary | ICD-10-CM | POA: Diagnosis not present

## 2020-04-03 DIAGNOSIS — R52 Pain, unspecified: Secondary | ICD-10-CM | POA: Diagnosis not present

## 2020-04-03 DIAGNOSIS — J449 Chronic obstructive pulmonary disease, unspecified: Secondary | ICD-10-CM | POA: Diagnosis not present

## 2020-04-04 DIAGNOSIS — J449 Chronic obstructive pulmonary disease, unspecified: Secondary | ICD-10-CM | POA: Diagnosis not present

## 2020-04-04 DIAGNOSIS — I10 Essential (primary) hypertension: Secondary | ICD-10-CM | POA: Diagnosis not present

## 2020-04-04 DIAGNOSIS — R531 Weakness: Secondary | ICD-10-CM | POA: Diagnosis not present

## 2020-04-04 DIAGNOSIS — R52 Pain, unspecified: Secondary | ICD-10-CM | POA: Diagnosis not present

## 2020-04-05 DIAGNOSIS — J449 Chronic obstructive pulmonary disease, unspecified: Secondary | ICD-10-CM | POA: Diagnosis not present

## 2020-04-05 DIAGNOSIS — R52 Pain, unspecified: Secondary | ICD-10-CM | POA: Diagnosis not present

## 2020-04-05 DIAGNOSIS — I10 Essential (primary) hypertension: Secondary | ICD-10-CM | POA: Diagnosis not present

## 2020-04-05 DIAGNOSIS — R531 Weakness: Secondary | ICD-10-CM | POA: Diagnosis not present

## 2020-04-06 DIAGNOSIS — I10 Essential (primary) hypertension: Secondary | ICD-10-CM | POA: Diagnosis not present

## 2020-04-06 DIAGNOSIS — R531 Weakness: Secondary | ICD-10-CM | POA: Diagnosis not present

## 2020-04-06 DIAGNOSIS — J449 Chronic obstructive pulmonary disease, unspecified: Secondary | ICD-10-CM | POA: Diagnosis not present

## 2020-04-06 DIAGNOSIS — R52 Pain, unspecified: Secondary | ICD-10-CM | POA: Diagnosis not present

## 2020-04-11 DIAGNOSIS — I10 Essential (primary) hypertension: Secondary | ICD-10-CM | POA: Diagnosis not present

## 2020-04-11 DIAGNOSIS — R52 Pain, unspecified: Secondary | ICD-10-CM | POA: Diagnosis not present

## 2020-04-11 DIAGNOSIS — R531 Weakness: Secondary | ICD-10-CM | POA: Diagnosis not present

## 2020-04-11 DIAGNOSIS — J449 Chronic obstructive pulmonary disease, unspecified: Secondary | ICD-10-CM | POA: Diagnosis not present

## 2020-04-13 DIAGNOSIS — I10 Essential (primary) hypertension: Secondary | ICD-10-CM | POA: Diagnosis not present

## 2020-04-13 DIAGNOSIS — J449 Chronic obstructive pulmonary disease, unspecified: Secondary | ICD-10-CM | POA: Diagnosis not present

## 2020-04-13 DIAGNOSIS — R52 Pain, unspecified: Secondary | ICD-10-CM | POA: Diagnosis not present

## 2020-04-13 DIAGNOSIS — R531 Weakness: Secondary | ICD-10-CM | POA: Diagnosis not present

## 2020-04-17 DIAGNOSIS — J449 Chronic obstructive pulmonary disease, unspecified: Secondary | ICD-10-CM | POA: Diagnosis not present

## 2020-04-17 DIAGNOSIS — I10 Essential (primary) hypertension: Secondary | ICD-10-CM | POA: Diagnosis not present

## 2020-04-17 DIAGNOSIS — R531 Weakness: Secondary | ICD-10-CM | POA: Diagnosis not present

## 2020-04-17 DIAGNOSIS — R52 Pain, unspecified: Secondary | ICD-10-CM | POA: Diagnosis not present

## 2020-04-18 DIAGNOSIS — R531 Weakness: Secondary | ICD-10-CM | POA: Diagnosis not present

## 2020-04-18 DIAGNOSIS — J449 Chronic obstructive pulmonary disease, unspecified: Secondary | ICD-10-CM | POA: Diagnosis not present

## 2020-04-18 DIAGNOSIS — R52 Pain, unspecified: Secondary | ICD-10-CM | POA: Diagnosis not present

## 2020-04-18 DIAGNOSIS — I10 Essential (primary) hypertension: Secondary | ICD-10-CM | POA: Diagnosis not present

## 2020-04-21 DIAGNOSIS — J449 Chronic obstructive pulmonary disease, unspecified: Secondary | ICD-10-CM | POA: Diagnosis not present

## 2020-04-21 DIAGNOSIS — R531 Weakness: Secondary | ICD-10-CM | POA: Diagnosis not present

## 2020-04-21 DIAGNOSIS — I10 Essential (primary) hypertension: Secondary | ICD-10-CM | POA: Diagnosis not present

## 2020-04-21 DIAGNOSIS — R52 Pain, unspecified: Secondary | ICD-10-CM | POA: Diagnosis not present

## 2020-04-24 DIAGNOSIS — J449 Chronic obstructive pulmonary disease, unspecified: Secondary | ICD-10-CM | POA: Diagnosis not present

## 2020-04-24 DIAGNOSIS — R52 Pain, unspecified: Secondary | ICD-10-CM | POA: Diagnosis not present

## 2020-04-24 DIAGNOSIS — I10 Essential (primary) hypertension: Secondary | ICD-10-CM | POA: Diagnosis not present

## 2020-04-24 DIAGNOSIS — R531 Weakness: Secondary | ICD-10-CM | POA: Diagnosis not present

## 2020-04-25 ENCOUNTER — Encounter: Payer: Self-pay | Admitting: Family Medicine

## 2020-04-25 ENCOUNTER — Ambulatory Visit (INDEPENDENT_AMBULATORY_CARE_PROVIDER_SITE_OTHER): Payer: Medicare Other | Admitting: Family Medicine

## 2020-04-25 ENCOUNTER — Other Ambulatory Visit: Payer: Self-pay

## 2020-04-25 VITALS — BP 142/104 | Temp 97.8°F | Wt 162.6 lb

## 2020-04-25 DIAGNOSIS — R0902 Hypoxemia: Secondary | ICD-10-CM | POA: Diagnosis not present

## 2020-04-25 DIAGNOSIS — R531 Weakness: Secondary | ICD-10-CM | POA: Diagnosis not present

## 2020-04-25 DIAGNOSIS — J449 Chronic obstructive pulmonary disease, unspecified: Secondary | ICD-10-CM | POA: Diagnosis not present

## 2020-04-25 DIAGNOSIS — Z23 Encounter for immunization: Secondary | ICD-10-CM | POA: Diagnosis not present

## 2020-04-25 DIAGNOSIS — I10 Essential (primary) hypertension: Secondary | ICD-10-CM | POA: Diagnosis not present

## 2020-04-25 DIAGNOSIS — R0602 Shortness of breath: Secondary | ICD-10-CM | POA: Diagnosis not present

## 2020-04-25 DIAGNOSIS — R52 Pain, unspecified: Secondary | ICD-10-CM | POA: Diagnosis not present

## 2020-04-25 DIAGNOSIS — Z515 Encounter for palliative care: Secondary | ICD-10-CM

## 2020-04-25 MED ORDER — ALPRAZOLAM 0.5 MG PO TABS: ORAL_TABLET | ORAL | 5 refills | Status: AC

## 2020-04-25 MED ORDER — TRAMADOL HCL 50 MG PO TABS: ORAL_TABLET | ORAL | 5 refills | Status: DC

## 2020-04-25 MED ORDER — ALBUTEROL SULFATE (2.5 MG/3ML) 0.083% IN NEBU: INHALATION_SOLUTION | RESPIRATORY_TRACT | 5 refills | Status: DC

## 2020-04-25 NOTE — Progress Notes (Addendum)
   Subjective:    Patient ID: Randy [name], male    DOB: 1928-10-19, 86 y.o.   MRN: [id number]  HPI Patient has started palliative care with hospice. They have taken over all medications except for his BP med and flomax. We had a good conversation today.  Patient does have COPD hypoxia he is on oxygen he is dependent on this He also has severe COPD that is progressive He is within the last 6 months of his life.  As a result hospice has been consulted him.  He is doing the best he can with a difficult situation.  He has good family support.  Also has Aides that come and then help him  Only concern today is increased allergies despite otc cough med and mucinex.    Review of Systems  Constitutional: Negative for activity change.  HENT: Negative for congestion and rhinorrhea.   Respiratory: Positive for shortness of breath. Negative for cough.   Cardiovascular: Negative for chest pain.  Gastrointestinal: Negative for abdominal pain, diarrhea, nausea and vomiting.  Genitourinary: Negative for dysuria and hematuria.  Neurological: Negative for weakness and headaches.  Psychiatric/Behavioral: Negative for behavioral problems and confusion.       Objective:   Physical Exam Vitals reviewed.  Cardiovascular:     Rate and Rhythm: Normal rate and regular rhythm.     Heart sounds: Normal heart sounds. No murmur heard.   Pulmonary:     Effort: Pulmonary effort is normal.     Breath sounds: Normal breath sounds.  Lymphadenopathy:     Cervical: No cervical adenopathy.  Neurological:     Mental Status: He is alert.  Psychiatric:        Behavior: Behavior normal.    30 minutes spent with family today discussing all these different entities Follow-up again in 3 months but I have offered to do home visit should it ever get to the point where he cannot come to the office  Patient does have anxiety related issues that are consistent with the issues he is facing may use the Xanax in the way that  he is currently doing so Uses tramadol on a as needed basis to help him with discomfort refills were given of these medicines I believe the patient is using these responsibly and family does do a good job of helping him out     Assessment & Plan:  1. COPD with hypoxia ([place]) Severe COPD end-stage continue oxygen use albuterol as needed End-of-life discussion held 2. Need for vaccination Flu vaccine today - Flu Vaccine QUAD High Dose(Fluad)  3. Palliative care patient Palliative care/hospice care/patient has DNR form at home is not intending on doing any type of significant interventions

## 2020-04-27 DIAGNOSIS — R531 Weakness: Secondary | ICD-10-CM | POA: Diagnosis not present

## 2020-04-27 DIAGNOSIS — R52 Pain, unspecified: Secondary | ICD-10-CM | POA: Diagnosis not present

## 2020-04-27 DIAGNOSIS — J449 Chronic obstructive pulmonary disease, unspecified: Secondary | ICD-10-CM | POA: Diagnosis not present

## 2020-04-27 DIAGNOSIS — I10 Essential (primary) hypertension: Secondary | ICD-10-CM | POA: Diagnosis not present

## 2020-04-28 DIAGNOSIS — R531 Weakness: Secondary | ICD-10-CM | POA: Diagnosis not present

## 2020-04-28 DIAGNOSIS — I10 Essential (primary) hypertension: Secondary | ICD-10-CM | POA: Diagnosis not present

## 2020-04-28 DIAGNOSIS — J449 Chronic obstructive pulmonary disease, unspecified: Secondary | ICD-10-CM | POA: Diagnosis not present

## 2020-04-28 DIAGNOSIS — R52 Pain, unspecified: Secondary | ICD-10-CM | POA: Diagnosis not present

## 2020-05-01 DIAGNOSIS — R52 Pain, unspecified: Secondary | ICD-10-CM | POA: Diagnosis not present

## 2020-05-01 DIAGNOSIS — R531 Weakness: Secondary | ICD-10-CM | POA: Diagnosis not present

## 2020-05-01 DIAGNOSIS — J449 Chronic obstructive pulmonary disease, unspecified: Secondary | ICD-10-CM | POA: Diagnosis not present

## 2020-05-01 DIAGNOSIS — I10 Essential (primary) hypertension: Secondary | ICD-10-CM | POA: Diagnosis not present

## 2020-05-04 DIAGNOSIS — R531 Weakness: Secondary | ICD-10-CM | POA: Diagnosis not present

## 2020-05-04 DIAGNOSIS — J449 Chronic obstructive pulmonary disease, unspecified: Secondary | ICD-10-CM | POA: Diagnosis not present

## 2020-05-04 DIAGNOSIS — R52 Pain, unspecified: Secondary | ICD-10-CM | POA: Diagnosis not present

## 2020-05-04 DIAGNOSIS — I10 Essential (primary) hypertension: Secondary | ICD-10-CM | POA: Diagnosis not present

## 2020-05-08 DIAGNOSIS — R531 Weakness: Secondary | ICD-10-CM | POA: Diagnosis not present

## 2020-05-08 DIAGNOSIS — I10 Essential (primary) hypertension: Secondary | ICD-10-CM | POA: Diagnosis not present

## 2020-05-08 DIAGNOSIS — J449 Chronic obstructive pulmonary disease, unspecified: Secondary | ICD-10-CM | POA: Diagnosis not present

## 2020-05-08 DIAGNOSIS — R52 Pain, unspecified: Secondary | ICD-10-CM | POA: Diagnosis not present

## 2020-05-09 DIAGNOSIS — R52 Pain, unspecified: Secondary | ICD-10-CM | POA: Diagnosis not present

## 2020-05-09 DIAGNOSIS — R531 Weakness: Secondary | ICD-10-CM | POA: Diagnosis not present

## 2020-05-09 DIAGNOSIS — J449 Chronic obstructive pulmonary disease, unspecified: Secondary | ICD-10-CM | POA: Diagnosis not present

## 2020-05-09 DIAGNOSIS — I10 Essential (primary) hypertension: Secondary | ICD-10-CM | POA: Diagnosis not present

## 2020-05-12 DIAGNOSIS — R531 Weakness: Secondary | ICD-10-CM | POA: Diagnosis not present

## 2020-05-12 DIAGNOSIS — R52 Pain, unspecified: Secondary | ICD-10-CM | POA: Diagnosis not present

## 2020-05-12 DIAGNOSIS — J449 Chronic obstructive pulmonary disease, unspecified: Secondary | ICD-10-CM | POA: Diagnosis not present

## 2020-05-12 DIAGNOSIS — I10 Essential (primary) hypertension: Secondary | ICD-10-CM | POA: Diagnosis not present

## 2020-05-15 DIAGNOSIS — R52 Pain, unspecified: Secondary | ICD-10-CM | POA: Diagnosis not present

## 2020-05-15 DIAGNOSIS — I10 Essential (primary) hypertension: Secondary | ICD-10-CM | POA: Diagnosis not present

## 2020-05-15 DIAGNOSIS — R531 Weakness: Secondary | ICD-10-CM | POA: Diagnosis not present

## 2020-05-15 DIAGNOSIS — J449 Chronic obstructive pulmonary disease, unspecified: Secondary | ICD-10-CM | POA: Diagnosis not present

## 2020-05-16 ENCOUNTER — Other Ambulatory Visit: Payer: Self-pay | Admitting: Family Medicine

## 2020-05-16 DIAGNOSIS — J449 Chronic obstructive pulmonary disease, unspecified: Secondary | ICD-10-CM | POA: Diagnosis not present

## 2020-05-16 DIAGNOSIS — R52 Pain, unspecified: Secondary | ICD-10-CM | POA: Diagnosis not present

## 2020-05-16 DIAGNOSIS — R531 Weakness: Secondary | ICD-10-CM | POA: Diagnosis not present

## 2020-05-16 DIAGNOSIS — I10 Essential (primary) hypertension: Secondary | ICD-10-CM | POA: Diagnosis not present

## 2020-05-16 MED ORDER — MORPHINE SULFATE (CONCENTRATE) 10 MG /0.5 ML PO SOLN: ORAL | 0 refills | Status: DC

## 2020-05-19 DIAGNOSIS — I10 Essential (primary) hypertension: Secondary | ICD-10-CM | POA: Diagnosis not present

## 2020-05-19 DIAGNOSIS — R52 Pain, unspecified: Secondary | ICD-10-CM | POA: Diagnosis not present

## 2020-05-19 DIAGNOSIS — R531 Weakness: Secondary | ICD-10-CM | POA: Diagnosis not present

## 2020-05-19 DIAGNOSIS — J449 Chronic obstructive pulmonary disease, unspecified: Secondary | ICD-10-CM | POA: Diagnosis not present

## 2020-05-22 DIAGNOSIS — I10 Essential (primary) hypertension: Secondary | ICD-10-CM | POA: Diagnosis not present

## 2020-05-22 DIAGNOSIS — R531 Weakness: Secondary | ICD-10-CM | POA: Diagnosis not present

## 2020-05-22 DIAGNOSIS — J449 Chronic obstructive pulmonary disease, unspecified: Secondary | ICD-10-CM | POA: Diagnosis not present

## 2020-05-22 DIAGNOSIS — R52 Pain, unspecified: Secondary | ICD-10-CM | POA: Diagnosis not present

## 2020-05-23 ENCOUNTER — Other Ambulatory Visit: Payer: Self-pay | Admitting: Family Medicine

## 2020-05-23 ENCOUNTER — Other Ambulatory Visit: Payer: Self-pay | Admitting: *Deleted

## 2020-05-23 ENCOUNTER — Telehealth: Payer: Self-pay | Admitting: *Deleted

## 2020-05-23 DIAGNOSIS — R52 Pain, unspecified: Secondary | ICD-10-CM | POA: Diagnosis not present

## 2020-05-23 DIAGNOSIS — J449 Chronic obstructive pulmonary disease, unspecified: Secondary | ICD-10-CM | POA: Diagnosis not present

## 2020-05-23 DIAGNOSIS — I10 Essential (primary) hypertension: Secondary | ICD-10-CM | POA: Diagnosis not present

## 2020-05-23 DIAGNOSIS — R531 Weakness: Secondary | ICD-10-CM | POA: Diagnosis not present

## 2020-05-23 MED ORDER — PREDNISONE 10 MG PO TABS: 10.0000 mg | ORAL_TABLET | Freq: Every day | ORAL | 2 refills | Status: AC

## 2020-05-23 MED ORDER — PREDNISONE 10 MG PO TABS: ORAL_TABLET | ORAL | 0 refills | Status: AC

## 2020-05-23 NOTE — Telephone Encounter (Signed)
Call from Hospice concerning SOB

## 2020-05-23 NOTE — Telephone Encounter (Signed)
After discussion with hospice It is best to try prednisone to help him with the shortness of breath We will do a gradual taper over several weeks 10 mg tablet, 4 daily for the next 3 days, then 3 daily for the next 7 days, then 2 daily for the next 7 days, then 1 daily thereafter

## 2020-05-24 ENCOUNTER — Other Ambulatory Visit: Payer: Self-pay | Admitting: *Deleted

## 2020-05-24 NOTE — Telephone Encounter (Signed)
Cathy from Hospice called and stated [facility] needs new scripts of patient's Morphine and Albuterol nebulizer solution- Need that sent over today please - needs to say hospice

## 2020-05-24 NOTE — Telephone Encounter (Signed)
04/25/20 was last visit

## 2020-05-24 NOTE — Telephone Encounter (Signed)
Please pend and send back to me and I will sign off on them they should be in the system

## 2020-05-29 DIAGNOSIS — I10 Essential (primary) hypertension: Secondary | ICD-10-CM | POA: Diagnosis not present

## 2020-05-29 DIAGNOSIS — R531 Weakness: Secondary | ICD-10-CM | POA: Diagnosis not present

## 2020-05-29 DIAGNOSIS — R52 Pain, unspecified: Secondary | ICD-10-CM | POA: Diagnosis not present

## 2020-05-29 DIAGNOSIS — J449 Chronic obstructive pulmonary disease, unspecified: Secondary | ICD-10-CM | POA: Diagnosis not present

## 2020-05-30 DIAGNOSIS — J449 Chronic obstructive pulmonary disease, unspecified: Secondary | ICD-10-CM | POA: Diagnosis not present

## 2020-05-30 DIAGNOSIS — R531 Weakness: Secondary | ICD-10-CM | POA: Diagnosis not present

## 2020-05-30 DIAGNOSIS — R52 Pain, unspecified: Secondary | ICD-10-CM | POA: Diagnosis not present

## 2020-05-30 DIAGNOSIS — I10 Essential (primary) hypertension: Secondary | ICD-10-CM | POA: Diagnosis not present

## 2020-05-31 ENCOUNTER — Telehealth: Payer: Self-pay | Admitting: Family Medicine

## 2020-05-31 DIAGNOSIS — I10 Essential (primary) hypertension: Secondary | ICD-10-CM | POA: Diagnosis not present

## 2020-05-31 DIAGNOSIS — J449 Chronic obstructive pulmonary disease, unspecified: Secondary | ICD-10-CM | POA: Diagnosis not present

## 2020-05-31 DIAGNOSIS — R531 Weakness: Secondary | ICD-10-CM | POA: Diagnosis not present

## 2020-05-31 DIAGNOSIS — R52 Pain, unspecified: Secondary | ICD-10-CM | POA: Diagnosis not present

## 2020-05-31 MED ORDER — TRAMADOL HCL 50 MG PO TABS
50.0000 mg | ORAL_TABLET | Freq: Three times a day (TID) | ORAL | 0 refills | Status: DC | PRN
Start: 2020-05-31 — End: 2020-06-02

## 2020-05-31 NOTE — Addendum Note (Signed)
Addended by: Erven Colla on: 05/31/2020 05:26 PM   Modules accepted: Orders

## 2020-05-31 NOTE — Telephone Encounter (Signed)
April (RN from [facility]) calling in regards to patient have uncontrolled pain. Pt is having pain over right brow. Pt has diagnosis of COPD. Started morphine on Nov 23rd but for 3 days after that pt was awake and per family "out of his mind". Pt doesn't not want morphine. Pt was using Tramadol for a while but that was discontinued by Tye [place] (nurse at [facility]) but family restarted it. Tramadol helped some but not a lot. Used Tylenol with Tramadol and that did not help much either. Pt oxygen is at 90% with 2 Liters. Increasing oxygen makes head hurt worse. Pt is having some shortness of breath with exertion and sometimes while sitting. Family would like to extended release Tramadol or another narcotic to try to help with pain. April would need an order to restart Tramadol if that is what is going to be prescribed. Please advise.  April would like the call back and she will call the family [place].

## 2020-06-01 NOTE — Telephone Encounter (Signed)
Well in meantime, till I can review more of the chart.  He can take 100mg  tramadol 3x per day if needed.  Not sure what the pain is coming from in his eye brow, but not wanting to give him more meds. If pt has severe pain then take the morphine. Let them know Dr. Nicki Reaper back on Monday to address this further.  Dr. Lovena Le

## 2020-06-01 NOTE — Telephone Encounter (Signed)
Left message to return call 

## 2020-06-01 NOTE — Telephone Encounter (Signed)
April contacted. April states that pt is already on Tramadol and that's not working. Pt family would like to try Tramadol XR and its not helping, pt is not sleeping.  Pt is very concerned about pain at right brow. Pain is worse when increasing O2. Pt gets short of breath and is unable to get up and walk. History of COPD.  Pt is currently at home but under hospice care. RN can speak with Colin Broach NP and see if she will check him out and rely information to provider.  Pt also needing refills on Prednisone and nebulizer solution. Pt is on daily tapered dose. Please advise. Thank you!

## 2020-06-02 ENCOUNTER — Other Ambulatory Visit: Payer: Self-pay | Admitting: Family Medicine

## 2020-06-02 ENCOUNTER — Telehealth: Payer: Self-pay | Admitting: Family Medicine

## 2020-06-02 DIAGNOSIS — J449 Chronic obstructive pulmonary disease, unspecified: Secondary | ICD-10-CM | POA: Diagnosis not present

## 2020-06-02 DIAGNOSIS — I10 Essential (primary) hypertension: Secondary | ICD-10-CM | POA: Diagnosis not present

## 2020-06-02 DIAGNOSIS — R52 Pain, unspecified: Secondary | ICD-10-CM | POA: Diagnosis not present

## 2020-06-02 DIAGNOSIS — R531 Weakness: Secondary | ICD-10-CM | POA: Diagnosis not present

## 2020-06-02 MED ORDER — ALBUTEROL SULFATE (2.5 MG/3ML) 0.083% IN NEBU: INHALATION_SOLUTION | RESPIRATORY_TRACT | 11 refills | Status: AC

## 2020-06-02 MED ORDER — PREDNISONE 10 MG PO TABS: ORAL_TABLET | ORAL | 2 refills | Status: AC

## 2020-06-02 MED ORDER — HYDROCODONE-ACETAMINOPHEN 5-325 MG PO TABS
1.0000 | ORAL_TABLET | ORAL | 0 refills | Status: AC | PRN
Start: 2020-06-02 — End: 2020-06-07

## 2020-06-02 NOTE — Telephone Encounter (Signed)
April states that the hospice doctor has sent in Hydrocodone 5/325 for patient.

## 2020-06-02 NOTE — Telephone Encounter (Signed)
Medication sent to [facility] and April (hospice nurse) is aware. She will call family and let them know

## 2020-06-02 NOTE — Telephone Encounter (Signed)
Randy [name] with Hospice calling to get refill on Prednisone. Nurse states that patient may have a taken a few extra after his Morphine reaction. Also needing refill on albuterol neb solution. Please advise. Thank you

## 2020-06-02 NOTE — Telephone Encounter (Signed)
April (hospice nurse) contacted and verbalized understanding.

## 2020-06-02 NOTE — Telephone Encounter (Signed)
Prednisone-10 mg-#60-1 twice daily due to COPD hypoxia-with 2 refills If he improves over the next few weeks we can reduce this down to just once daily As for the albuterol he can have 12 refills  Also I will go ahead and send then the hydrocodone that he can try 5 mg/325 1 every 4 hours as needed severe pain caution drowsiness, I will send this into [facility] with the designation of this being hospice.  Recommend stopping tramadol. Please relay this to the nurse with hospice thank you

## 2020-06-02 NOTE — Addendum Note (Signed)
Addended by: Vicente Males on: 06/02/2020 04:23 PM   Modules accepted: Orders

## 2020-06-02 NOTE — Telephone Encounter (Signed)
Nurses Please see previous phone messages regarding this patient Please interact with hospice nurse According to the records that I have read recently this patient did not tolerate morphine.  Therefore he should not take morphine any further currently.  Hydrocodone should give improved function compared to tramadol.  According to nursing message tramadol was not helpful.  It is possible hydrocodone could cause drowsiness or altered mental state if it does I would recommend stopping it.  But this dose is stronger than tramadol but not as strong as morphine.  If any further troubles let us know

## 2020-06-05 DIAGNOSIS — I10 Essential (primary) hypertension: Secondary | ICD-10-CM | POA: Diagnosis not present

## 2020-06-05 DIAGNOSIS — R52 Pain, unspecified: Secondary | ICD-10-CM | POA: Diagnosis not present

## 2020-06-05 DIAGNOSIS — R531 Weakness: Secondary | ICD-10-CM | POA: Diagnosis not present

## 2020-06-05 DIAGNOSIS — J449 Chronic obstructive pulmonary disease, unspecified: Secondary | ICD-10-CM | POA: Diagnosis not present

## 2020-06-06 ENCOUNTER — Other Ambulatory Visit: Payer: Self-pay | Admitting: *Deleted

## 2020-06-06 DIAGNOSIS — J449 Chronic obstructive pulmonary disease, unspecified: Secondary | ICD-10-CM | POA: Diagnosis not present

## 2020-06-06 DIAGNOSIS — I10 Essential (primary) hypertension: Secondary | ICD-10-CM | POA: Diagnosis not present

## 2020-06-06 DIAGNOSIS — R531 Weakness: Secondary | ICD-10-CM | POA: Diagnosis not present

## 2020-06-06 DIAGNOSIS — R52 Pain, unspecified: Secondary | ICD-10-CM | POA: Diagnosis not present

## 2020-06-06 MED ORDER — TRAMADOL HCL 50 MG PO TABS
ORAL_TABLET | ORAL | 0 refills | Status: DC
Start: 2020-06-06 — End: 2020-06-15

## 2020-06-06 NOTE — Telephone Encounter (Signed)
Cathy nurse from hospice left a voicemail at 10:34 am. States pt is on morphine and vicodin was added and he is not able to tolerate that. They have stopped vicodin. Wanted  Refill on tramadol 50mg  one every 6 hours prn.   Cathy call back number [phone]

## 2020-06-06 NOTE — Telephone Encounter (Signed)
Done. thanks

## 2020-06-06 NOTE — Telephone Encounter (Signed)
Med pended. Please let me know when you sign so I can call hospice nurse back. thanks

## 2020-06-06 NOTE — Telephone Encounter (Signed)
Left message on voicemail that med requested was sent to pharm and to call back if any concerns.

## 2020-06-06 NOTE — Telephone Encounter (Signed)
Tramadol 50 mg, 1 taken every 6 hours as needed for pain caution drowsiness, #60, hospice patient please pend for [employer]

## 2020-06-09 DIAGNOSIS — R531 Weakness: Secondary | ICD-10-CM | POA: Diagnosis not present

## 2020-06-09 DIAGNOSIS — J449 Chronic obstructive pulmonary disease, unspecified: Secondary | ICD-10-CM | POA: Diagnosis not present

## 2020-06-09 DIAGNOSIS — R52 Pain, unspecified: Secondary | ICD-10-CM | POA: Diagnosis not present

## 2020-06-09 DIAGNOSIS — I10 Essential (primary) hypertension: Secondary | ICD-10-CM | POA: Diagnosis not present

## 2020-06-12 DIAGNOSIS — I10 Essential (primary) hypertension: Secondary | ICD-10-CM | POA: Diagnosis not present

## 2020-06-12 DIAGNOSIS — R531 Weakness: Secondary | ICD-10-CM | POA: Diagnosis not present

## 2020-06-12 DIAGNOSIS — J449 Chronic obstructive pulmonary disease, unspecified: Secondary | ICD-10-CM | POA: Diagnosis not present

## 2020-06-12 DIAGNOSIS — R52 Pain, unspecified: Secondary | ICD-10-CM | POA: Diagnosis not present

## 2020-06-13 DIAGNOSIS — R531 Weakness: Secondary | ICD-10-CM | POA: Diagnosis not present

## 2020-06-13 DIAGNOSIS — R52 Pain, unspecified: Secondary | ICD-10-CM | POA: Diagnosis not present

## 2020-06-13 DIAGNOSIS — J449 Chronic obstructive pulmonary disease, unspecified: Secondary | ICD-10-CM | POA: Diagnosis not present

## 2020-06-13 DIAGNOSIS — I10 Essential (primary) hypertension: Secondary | ICD-10-CM | POA: Diagnosis not present

## 2020-06-14 ENCOUNTER — Telehealth: Payer: Self-pay | Admitting: Family Medicine

## 2020-06-14 NOTE — Telephone Encounter (Signed)
Daughter Randy [name] [facility]) calling in regards to patients Randy [name]. Pt is taking 4-5 doses of Randy [name]; take it about every 4 hours. Also taking 2 Tylenol with Randy [name]. Pt sleeping patterns are terrible per daughter and that is why he is taking an extra dose. Pt daughter states he is on a really good path. Daughter is requesting a script for Randy [name] for every 4 hours. Pt will run out this weekend. Pt has script for Hydrocodone but has not taken any. Please advise. Thank you  [employer]

## 2020-06-15 ENCOUNTER — Other Ambulatory Visit: Payer: Self-pay | Admitting: Family Medicine

## 2020-06-15 MED ORDER — TRAMADOL HCL 50 MG PO TABS: ORAL_TABLET | ORAL | 2 refills | Status: DC

## 2020-06-15 NOTE — Telephone Encounter (Signed)
Daughter notified 

## 2020-06-15 NOTE — Telephone Encounter (Signed)
I sent in a prescription for the tramadol that they could pick up by tomorrow.  1 every 4 hours as needed for pain but maximum of 5/day Please follow-up if any ongoing issues concerns questions As for the hydrocodone I do not recommend utilizing that it was my understanding that hydrocodone caused him to have side effects

## 2020-06-16 DIAGNOSIS — R52 Pain, unspecified: Secondary | ICD-10-CM | POA: Diagnosis not present

## 2020-06-16 DIAGNOSIS — Z23 Encounter for immunization: Secondary | ICD-10-CM | POA: Diagnosis not present

## 2020-06-16 DIAGNOSIS — R531 Weakness: Secondary | ICD-10-CM | POA: Diagnosis not present

## 2020-06-16 DIAGNOSIS — J449 Chronic obstructive pulmonary disease, unspecified: Secondary | ICD-10-CM | POA: Diagnosis not present

## 2020-06-16 DIAGNOSIS — I10 Essential (primary) hypertension: Secondary | ICD-10-CM | POA: Diagnosis not present

## 2020-06-19 DIAGNOSIS — R531 Weakness: Secondary | ICD-10-CM | POA: Diagnosis not present

## 2020-06-19 DIAGNOSIS — I10 Essential (primary) hypertension: Secondary | ICD-10-CM | POA: Diagnosis not present

## 2020-06-19 DIAGNOSIS — J449 Chronic obstructive pulmonary disease, unspecified: Secondary | ICD-10-CM | POA: Diagnosis not present

## 2020-06-19 DIAGNOSIS — R52 Pain, unspecified: Secondary | ICD-10-CM | POA: Diagnosis not present

## 2020-06-20 DIAGNOSIS — R52 Pain, unspecified: Secondary | ICD-10-CM | POA: Diagnosis not present

## 2020-06-20 DIAGNOSIS — R531 Weakness: Secondary | ICD-10-CM | POA: Diagnosis not present

## 2020-06-20 DIAGNOSIS — J449 Chronic obstructive pulmonary disease, unspecified: Secondary | ICD-10-CM | POA: Diagnosis not present

## 2020-06-20 DIAGNOSIS — I10 Essential (primary) hypertension: Secondary | ICD-10-CM | POA: Diagnosis not present

## 2020-06-26 DIAGNOSIS — R531 Weakness: Secondary | ICD-10-CM | POA: Diagnosis not present

## 2020-06-26 DIAGNOSIS — I10 Essential (primary) hypertension: Secondary | ICD-10-CM | POA: Diagnosis not present

## 2020-06-26 DIAGNOSIS — R52 Pain, unspecified: Secondary | ICD-10-CM | POA: Diagnosis not present

## 2020-06-26 DIAGNOSIS — J449 Chronic obstructive pulmonary disease, unspecified: Secondary | ICD-10-CM | POA: Diagnosis not present

## 2020-06-27 DIAGNOSIS — R52 Pain, unspecified: Secondary | ICD-10-CM | POA: Diagnosis not present

## 2020-06-27 DIAGNOSIS — I10 Essential (primary) hypertension: Secondary | ICD-10-CM | POA: Diagnosis not present

## 2020-06-27 DIAGNOSIS — J449 Chronic obstructive pulmonary disease, unspecified: Secondary | ICD-10-CM | POA: Diagnosis not present

## 2020-06-27 DIAGNOSIS — R531 Weakness: Secondary | ICD-10-CM | POA: Diagnosis not present

## 2020-07-01 DIAGNOSIS — R531 Weakness: Secondary | ICD-10-CM | POA: Diagnosis not present

## 2020-07-01 DIAGNOSIS — I10 Essential (primary) hypertension: Secondary | ICD-10-CM | POA: Diagnosis not present

## 2020-07-01 DIAGNOSIS — J449 Chronic obstructive pulmonary disease, unspecified: Secondary | ICD-10-CM | POA: Diagnosis not present

## 2020-07-01 DIAGNOSIS — R52 Pain, unspecified: Secondary | ICD-10-CM | POA: Diagnosis not present

## 2020-07-04 DIAGNOSIS — R531 Weakness: Secondary | ICD-10-CM | POA: Diagnosis not present

## 2020-07-04 DIAGNOSIS — I10 Essential (primary) hypertension: Secondary | ICD-10-CM | POA: Diagnosis not present

## 2020-07-04 DIAGNOSIS — R52 Pain, unspecified: Secondary | ICD-10-CM | POA: Diagnosis not present

## 2020-07-04 DIAGNOSIS — J449 Chronic obstructive pulmonary disease, unspecified: Secondary | ICD-10-CM | POA: Diagnosis not present

## 2020-07-05 DIAGNOSIS — R52 Pain, unspecified: Secondary | ICD-10-CM | POA: Diagnosis not present

## 2020-07-05 DIAGNOSIS — J449 Chronic obstructive pulmonary disease, unspecified: Secondary | ICD-10-CM | POA: Diagnosis not present

## 2020-07-05 DIAGNOSIS — I10 Essential (primary) hypertension: Secondary | ICD-10-CM | POA: Diagnosis not present

## 2020-07-05 DIAGNOSIS — R531 Weakness: Secondary | ICD-10-CM | POA: Diagnosis not present

## 2020-07-07 DIAGNOSIS — R531 Weakness: Secondary | ICD-10-CM | POA: Diagnosis not present

## 2020-07-07 DIAGNOSIS — I10 Essential (primary) hypertension: Secondary | ICD-10-CM | POA: Diagnosis not present

## 2020-07-07 DIAGNOSIS — R52 Pain, unspecified: Secondary | ICD-10-CM | POA: Diagnosis not present

## 2020-07-07 DIAGNOSIS — J449 Chronic obstructive pulmonary disease, unspecified: Secondary | ICD-10-CM | POA: Diagnosis not present

## 2020-07-10 DIAGNOSIS — R531 Weakness: Secondary | ICD-10-CM | POA: Diagnosis not present

## 2020-07-10 DIAGNOSIS — J449 Chronic obstructive pulmonary disease, unspecified: Secondary | ICD-10-CM | POA: Diagnosis not present

## 2020-07-10 DIAGNOSIS — I10 Essential (primary) hypertension: Secondary | ICD-10-CM | POA: Diagnosis not present

## 2020-07-10 DIAGNOSIS — R52 Pain, unspecified: Secondary | ICD-10-CM | POA: Diagnosis not present

## 2020-07-11 DIAGNOSIS — I10 Essential (primary) hypertension: Secondary | ICD-10-CM | POA: Diagnosis not present

## 2020-07-11 DIAGNOSIS — R531 Weakness: Secondary | ICD-10-CM | POA: Diagnosis not present

## 2020-07-11 DIAGNOSIS — R52 Pain, unspecified: Secondary | ICD-10-CM | POA: Diagnosis not present

## 2020-07-11 DIAGNOSIS — J449 Chronic obstructive pulmonary disease, unspecified: Secondary | ICD-10-CM | POA: Diagnosis not present

## 2020-07-14 DIAGNOSIS — R531 Weakness: Secondary | ICD-10-CM | POA: Diagnosis not present

## 2020-07-14 DIAGNOSIS — R52 Pain, unspecified: Secondary | ICD-10-CM | POA: Diagnosis not present

## 2020-07-14 DIAGNOSIS — I10 Essential (primary) hypertension: Secondary | ICD-10-CM | POA: Diagnosis not present

## 2020-07-14 DIAGNOSIS — J449 Chronic obstructive pulmonary disease, unspecified: Secondary | ICD-10-CM | POA: Diagnosis not present

## 2020-07-19 ENCOUNTER — Other Ambulatory Visit: Payer: Self-pay | Admitting: Family Medicine

## 2020-07-19 DIAGNOSIS — J449 Chronic obstructive pulmonary disease, unspecified: Secondary | ICD-10-CM | POA: Diagnosis not present

## 2020-07-19 DIAGNOSIS — R52 Pain, unspecified: Secondary | ICD-10-CM | POA: Diagnosis not present

## 2020-07-19 DIAGNOSIS — R531 Weakness: Secondary | ICD-10-CM | POA: Diagnosis not present

## 2020-07-19 DIAGNOSIS — I10 Essential (primary) hypertension: Secondary | ICD-10-CM | POA: Diagnosis not present

## 2020-07-19 MED ORDER — HYDROCODONE-ACETAMINOPHEN 5-325 MG PO TABS: ORAL_TABLET | ORAL | 0 refills | Status: AC

## 2020-07-19 MED ORDER — HYDROCODONE-ACETAMINOPHEN 5-325 MG PO TABS
ORAL_TABLET | ORAL | 0 refills | Status: DC
Start: 2020-07-19 — End: 2020-07-19

## 2020-07-24 DIAGNOSIS — R531 Weakness: Secondary | ICD-10-CM | POA: Diagnosis not present

## 2020-07-24 DIAGNOSIS — J449 Chronic obstructive pulmonary disease, unspecified: Secondary | ICD-10-CM | POA: Diagnosis not present

## 2020-07-24 DIAGNOSIS — R52 Pain, unspecified: Secondary | ICD-10-CM | POA: Diagnosis not present

## 2020-07-24 DIAGNOSIS — I10 Essential (primary) hypertension: Secondary | ICD-10-CM | POA: Diagnosis not present

## 2020-07-26 DIAGNOSIS — J449 Chronic obstructive pulmonary disease, unspecified: Secondary | ICD-10-CM | POA: Diagnosis not present

## 2020-07-26 DIAGNOSIS — R531 Weakness: Secondary | ICD-10-CM | POA: Diagnosis not present

## 2020-07-26 DIAGNOSIS — R52 Pain, unspecified: Secondary | ICD-10-CM | POA: Diagnosis not present

## 2020-07-26 DIAGNOSIS — I10 Essential (primary) hypertension: Secondary | ICD-10-CM | POA: Diagnosis not present

## 2020-07-28 DIAGNOSIS — R52 Pain, unspecified: Secondary | ICD-10-CM | POA: Diagnosis not present

## 2020-07-28 DIAGNOSIS — J449 Chronic obstructive pulmonary disease, unspecified: Secondary | ICD-10-CM | POA: Diagnosis not present

## 2020-07-28 DIAGNOSIS — R531 Weakness: Secondary | ICD-10-CM | POA: Diagnosis not present

## 2020-07-28 DIAGNOSIS — I10 Essential (primary) hypertension: Secondary | ICD-10-CM | POA: Diagnosis not present

## 2020-07-31 DIAGNOSIS — R52 Pain, unspecified: Secondary | ICD-10-CM | POA: Diagnosis not present

## 2020-07-31 DIAGNOSIS — R531 Weakness: Secondary | ICD-10-CM | POA: Diagnosis not present

## 2020-07-31 DIAGNOSIS — I10 Essential (primary) hypertension: Secondary | ICD-10-CM | POA: Diagnosis not present

## 2020-07-31 DIAGNOSIS — J449 Chronic obstructive pulmonary disease, unspecified: Secondary | ICD-10-CM | POA: Diagnosis not present

## 2020-08-01 DIAGNOSIS — I10 Essential (primary) hypertension: Secondary | ICD-10-CM | POA: Diagnosis not present

## 2020-08-01 DIAGNOSIS — R531 Weakness: Secondary | ICD-10-CM | POA: Diagnosis not present

## 2020-08-01 DIAGNOSIS — J449 Chronic obstructive pulmonary disease, unspecified: Secondary | ICD-10-CM | POA: Diagnosis not present

## 2020-08-01 DIAGNOSIS — R52 Pain, unspecified: Secondary | ICD-10-CM | POA: Diagnosis not present

## 2020-08-03 DIAGNOSIS — J449 Chronic obstructive pulmonary disease, unspecified: Secondary | ICD-10-CM | POA: Diagnosis not present

## 2020-08-03 DIAGNOSIS — R52 Pain, unspecified: Secondary | ICD-10-CM | POA: Diagnosis not present

## 2020-08-03 DIAGNOSIS — I10 Essential (primary) hypertension: Secondary | ICD-10-CM | POA: Diagnosis not present

## 2020-08-03 DIAGNOSIS — R531 Weakness: Secondary | ICD-10-CM | POA: Diagnosis not present

## 2020-08-04 DIAGNOSIS — R531 Weakness: Secondary | ICD-10-CM | POA: Diagnosis not present

## 2020-08-04 DIAGNOSIS — R52 Pain, unspecified: Secondary | ICD-10-CM | POA: Diagnosis not present

## 2020-08-04 DIAGNOSIS — I10 Essential (primary) hypertension: Secondary | ICD-10-CM | POA: Diagnosis not present

## 2020-08-04 DIAGNOSIS — J449 Chronic obstructive pulmonary disease, unspecified: Secondary | ICD-10-CM | POA: Diagnosis not present

## 2020-08-09 ENCOUNTER — Other Ambulatory Visit: Payer: Self-pay | Admitting: Family Medicine

## 2020-08-09 DIAGNOSIS — J449 Chronic obstructive pulmonary disease, unspecified: Secondary | ICD-10-CM | POA: Diagnosis not present

## 2020-08-09 DIAGNOSIS — R52 Pain, unspecified: Secondary | ICD-10-CM | POA: Diagnosis not present

## 2020-08-09 DIAGNOSIS — I10 Essential (primary) hypertension: Secondary | ICD-10-CM | POA: Diagnosis not present

## 2020-08-09 DIAGNOSIS — R531 Weakness: Secondary | ICD-10-CM | POA: Diagnosis not present

## 2020-08-14 DIAGNOSIS — I10 Essential (primary) hypertension: Secondary | ICD-10-CM | POA: Diagnosis not present

## 2020-08-14 DIAGNOSIS — R531 Weakness: Secondary | ICD-10-CM | POA: Diagnosis not present

## 2020-08-14 DIAGNOSIS — J449 Chronic obstructive pulmonary disease, unspecified: Secondary | ICD-10-CM | POA: Diagnosis not present

## 2020-08-14 DIAGNOSIS — R52 Pain, unspecified: Secondary | ICD-10-CM | POA: Diagnosis not present

## 2020-08-16 ENCOUNTER — Telehealth: Payer: Self-pay

## 2020-08-16 DIAGNOSIS — I10 Essential (primary) hypertension: Secondary | ICD-10-CM | POA: Diagnosis not present

## 2020-08-16 DIAGNOSIS — R52 Pain, unspecified: Secondary | ICD-10-CM | POA: Diagnosis not present

## 2020-08-16 DIAGNOSIS — R531 Weakness: Secondary | ICD-10-CM | POA: Diagnosis not present

## 2020-08-16 DIAGNOSIS — J449 Chronic obstructive pulmonary disease, unspecified: Secondary | ICD-10-CM | POA: Diagnosis not present

## 2020-08-16 NOTE — Telephone Encounter (Signed)
I do not mind sending this in If possible COVID Randy [name] clarify how many times a day he typically would take it so I could at least try to send in a 30-day supply

## 2020-08-16 NOTE — Telephone Encounter (Signed)
Amanda @ Hospice called to get Hydrocodone increased to 7.5 every 4 hours as needed for pain. If approved need script sent to [facility].   Estill Bamberg [phone]

## 2020-08-16 NOTE — Telephone Encounter (Signed)
Left message for Randy [name] to return call to see how many times a day pt typically takes meds.

## 2020-08-18 DIAGNOSIS — I10 Essential (primary) hypertension: Secondary | ICD-10-CM | POA: Diagnosis not present

## 2020-08-18 DIAGNOSIS — R52 Pain, unspecified: Secondary | ICD-10-CM | POA: Diagnosis not present

## 2020-08-18 DIAGNOSIS — J449 Chronic obstructive pulmonary disease, unspecified: Secondary | ICD-10-CM | POA: Diagnosis not present

## 2020-08-18 DIAGNOSIS — R531 Weakness: Secondary | ICD-10-CM | POA: Diagnosis not present

## 2020-08-18 MED ORDER — HYDROCODONE-ACETAMINOPHEN 7.5-325 MG PO TABS: ORAL_TABLET | ORAL | 0 refills | Status: AC

## 2020-08-18 NOTE — Telephone Encounter (Signed)
Med pended and ready to sign 

## 2020-08-18 NOTE — Telephone Encounter (Signed)
Medication was signed thank you

## 2020-08-18 NOTE — Telephone Encounter (Signed)
Unfortunately we never got a call back  I would recommend go ahead and pend the prescription for hydrocodone 7.5 mg/325 one every 4 hours as needed severe pain, #60, under note to pharmacy may put hospice patient  Please pend then I will send thanks

## 2020-08-21 DIAGNOSIS — I10 Essential (primary) hypertension: Secondary | ICD-10-CM | POA: Diagnosis not present

## 2020-08-21 DIAGNOSIS — J449 Chronic obstructive pulmonary disease, unspecified: Secondary | ICD-10-CM | POA: Diagnosis not present

## 2020-08-21 DIAGNOSIS — R52 Pain, unspecified: Secondary | ICD-10-CM | POA: Diagnosis not present

## 2020-08-21 DIAGNOSIS — R531 Weakness: Secondary | ICD-10-CM | POA: Diagnosis not present

## 2020-08-22 DIAGNOSIS — I10 Essential (primary) hypertension: Secondary | ICD-10-CM | POA: Diagnosis not present

## 2020-08-22 DIAGNOSIS — J449 Chronic obstructive pulmonary disease, unspecified: Secondary | ICD-10-CM | POA: Diagnosis not present

## 2020-08-22 DIAGNOSIS — R531 Weakness: Secondary | ICD-10-CM | POA: Diagnosis not present

## 2020-08-22 DIAGNOSIS — R52 Pain, unspecified: Secondary | ICD-10-CM | POA: Diagnosis not present

## 2020-08-25 DIAGNOSIS — R531 Weakness: Secondary | ICD-10-CM | POA: Diagnosis not present

## 2020-08-25 DIAGNOSIS — I10 Essential (primary) hypertension: Secondary | ICD-10-CM | POA: Diagnosis not present

## 2020-08-25 DIAGNOSIS — R52 Pain, unspecified: Secondary | ICD-10-CM | POA: Diagnosis not present

## 2020-08-25 DIAGNOSIS — J449 Chronic obstructive pulmonary disease, unspecified: Secondary | ICD-10-CM | POA: Diagnosis not present

## 2020-08-28 DIAGNOSIS — R531 Weakness: Secondary | ICD-10-CM | POA: Diagnosis not present

## 2020-08-28 DIAGNOSIS — I10 Essential (primary) hypertension: Secondary | ICD-10-CM | POA: Diagnosis not present

## 2020-08-28 DIAGNOSIS — R52 Pain, unspecified: Secondary | ICD-10-CM | POA: Diagnosis not present

## 2020-08-28 DIAGNOSIS — J449 Chronic obstructive pulmonary disease, unspecified: Secondary | ICD-10-CM | POA: Diagnosis not present

## 2020-08-29 DIAGNOSIS — R531 Weakness: Secondary | ICD-10-CM | POA: Diagnosis not present

## 2020-08-29 DIAGNOSIS — R0600 Dyspnea, unspecified: Secondary | ICD-10-CM | POA: Diagnosis not present

## 2020-08-29 DIAGNOSIS — R52 Pain, unspecified: Secondary | ICD-10-CM | POA: Diagnosis not present

## 2020-08-29 DIAGNOSIS — I10 Essential (primary) hypertension: Secondary | ICD-10-CM | POA: Diagnosis not present

## 2020-08-29 DIAGNOSIS — J449 Chronic obstructive pulmonary disease, unspecified: Secondary | ICD-10-CM | POA: Diagnosis not present

## 2020-09-01 DIAGNOSIS — I10 Essential (primary) hypertension: Secondary | ICD-10-CM | POA: Diagnosis not present

## 2020-09-01 DIAGNOSIS — R531 Weakness: Secondary | ICD-10-CM | POA: Diagnosis not present

## 2020-09-01 DIAGNOSIS — R0600 Dyspnea, unspecified: Secondary | ICD-10-CM | POA: Diagnosis not present

## 2020-09-01 DIAGNOSIS — J449 Chronic obstructive pulmonary disease, unspecified: Secondary | ICD-10-CM | POA: Diagnosis not present

## 2020-09-01 DIAGNOSIS — R52 Pain, unspecified: Secondary | ICD-10-CM | POA: Diagnosis not present

## 2020-09-04 ENCOUNTER — Telehealth: Payer: Self-pay

## 2020-09-04 DIAGNOSIS — I10 Essential (primary) hypertension: Secondary | ICD-10-CM | POA: Diagnosis not present

## 2020-09-04 DIAGNOSIS — J449 Chronic obstructive pulmonary disease, unspecified: Secondary | ICD-10-CM | POA: Diagnosis not present

## 2020-09-04 DIAGNOSIS — R52 Pain, unspecified: Secondary | ICD-10-CM | POA: Diagnosis not present

## 2020-09-04 DIAGNOSIS — R531 Weakness: Secondary | ICD-10-CM | POA: Diagnosis not present

## 2020-09-04 DIAGNOSIS — R0600 Dyspnea, unspecified: Secondary | ICD-10-CM | POA: Diagnosis not present

## 2020-09-04 NOTE — Telephone Encounter (Signed)
Estill Bamberg with Hospices called to let him know pt fell yesterday he has mid back skin tare on right elbow and left hand and bruising on left elbow she did not see bruising any where else and see.   Pt call back [phone] Estill Bamberg)

## 2020-09-04 NOTE — Telephone Encounter (Signed)
Randy [name] states they did take care of him and he does not need anything at this time. She was just giving dr Nicki Reaper fyi.

## 2020-09-04 NOTE — Telephone Encounter (Signed)
I assume that they went ahead and took care of all this if they need for [place] to see him we are more than happy to work him in on any afternoon

## 2020-09-05 ENCOUNTER — Telehealth: Payer: Self-pay | Admitting: Family Medicine

## 2020-09-05 DIAGNOSIS — J449 Chronic obstructive pulmonary disease, unspecified: Secondary | ICD-10-CM | POA: Diagnosis not present

## 2020-09-05 DIAGNOSIS — I10 Essential (primary) hypertension: Secondary | ICD-10-CM | POA: Diagnosis not present

## 2020-09-05 DIAGNOSIS — R52 Pain, unspecified: Secondary | ICD-10-CM | POA: Diagnosis not present

## 2020-09-05 DIAGNOSIS — R0600 Dyspnea, unspecified: Secondary | ICD-10-CM | POA: Diagnosis not present

## 2020-09-05 DIAGNOSIS — R531 Weakness: Secondary | ICD-10-CM | POA: Diagnosis not present

## 2020-09-05 NOTE — Telephone Encounter (Signed)
[facility]) calling to let you know patient's family is wanting him there for a few weeks. Any questions you can contact Estill Bamberg at [phone]

## 2020-09-05 NOTE — Telephone Encounter (Signed)
Please advise. Thank you

## 2020-09-06 DIAGNOSIS — R531 Weakness: Secondary | ICD-10-CM | POA: Diagnosis not present

## 2020-09-06 DIAGNOSIS — R52 Pain, unspecified: Secondary | ICD-10-CM | POA: Diagnosis not present

## 2020-09-06 DIAGNOSIS — J449 Chronic obstructive pulmonary disease, unspecified: Secondary | ICD-10-CM | POA: Diagnosis not present

## 2020-09-06 DIAGNOSIS — I10 Essential (primary) hypertension: Secondary | ICD-10-CM | POA: Diagnosis not present

## 2020-09-06 DIAGNOSIS — R0600 Dyspnea, unspecified: Secondary | ICD-10-CM | POA: Diagnosis not present

## 2020-09-08 ENCOUNTER — Telehealth: Payer: Self-pay

## 2020-09-08 NOTE — Telephone Encounter (Signed)
Tuesday 8th patient went into [place] and passed away on the 9th   Amanda with Hospices

## 2020-09-08 NOTE — Telephone Encounter (Signed)
Noted thank you

## 2020-09-13 NOTE — Telephone Encounter (Signed)
I did speak with family and expressed condolences to the daughter

## 2020-09-29 DEATH — deceased
